# Patient Record
Sex: Male | Born: 1965 | Race: Black or African American | Hispanic: No | Marital: Married | Smoking: Former smoker
Health system: Southern US, Community
[De-identification: ages and names within clinical notes are randomized; demographics above are authoritative.]

## PROBLEM LIST (undated history)

## (undated) DIAGNOSIS — E119 Type 2 diabetes mellitus without complications: Secondary | ICD-10-CM

## (undated) DIAGNOSIS — I4891 Unspecified atrial fibrillation: Secondary | ICD-10-CM

## (undated) DIAGNOSIS — G4730 Sleep apnea, unspecified: Secondary | ICD-10-CM

## (undated) DIAGNOSIS — E785 Hyperlipidemia, unspecified: Secondary | ICD-10-CM

## (undated) DIAGNOSIS — Z8669 Personal history of other diseases of the nervous system and sense organs: Secondary | ICD-10-CM

## (undated) DIAGNOSIS — I10 Essential (primary) hypertension: Secondary | ICD-10-CM

## (undated) DIAGNOSIS — T7840XA Allergy, unspecified, initial encounter: Secondary | ICD-10-CM

## (undated) DIAGNOSIS — E669 Obesity, unspecified: Secondary | ICD-10-CM

## (undated) DIAGNOSIS — Z0389 Encounter for observation for other suspected diseases and conditions ruled out: Secondary | ICD-10-CM

## (undated) DIAGNOSIS — I509 Heart failure, unspecified: Secondary | ICD-10-CM

## (undated) DIAGNOSIS — E860 Dehydration: Secondary | ICD-10-CM

## (undated) DIAGNOSIS — I428 Other cardiomyopathies: Secondary | ICD-10-CM

## (undated) HISTORY — DX: Obesity, unspecified: E66.9

## (undated) HISTORY — DX: Personal history of other diseases of the nervous system and sense organs: Z86.69

## (undated) HISTORY — PX: HERNIA REPAIR: SHX51

## (undated) HISTORY — DX: Essential (primary) hypertension: I10

## (undated) HISTORY — DX: Hyperlipidemia, unspecified: E78.5

## (undated) HISTORY — DX: Heart failure, unspecified: I50.9

## (undated) HISTORY — DX: Sleep apnea, unspecified: G47.30

## (undated) HISTORY — DX: Other cardiomyopathies: I42.8

## (undated) HISTORY — DX: Allergy, unspecified, initial encounter: T78.40XA

## (undated) HISTORY — DX: Dehydration: E86.0

## (undated) HISTORY — DX: Type 2 diabetes mellitus without complications: E11.9

---

## 1998-09-05 ENCOUNTER — Emergency Department (HOSPITAL_COMMUNITY): Admission: EM | Admit: 1998-09-05 | Discharge: 1998-09-05 | Payer: Self-pay | Admitting: Emergency Medicine

## 1998-09-16 ENCOUNTER — Emergency Department (HOSPITAL_COMMUNITY): Admission: EM | Admit: 1998-09-16 | Discharge: 1998-09-16 | Payer: Self-pay | Admitting: Emergency Medicine

## 1999-03-16 ENCOUNTER — Emergency Department (HOSPITAL_COMMUNITY): Admission: EM | Admit: 1999-03-16 | Discharge: 1999-03-16 | Payer: Self-pay | Admitting: Emergency Medicine

## 1999-06-08 ENCOUNTER — Emergency Department (HOSPITAL_COMMUNITY): Admission: EM | Admit: 1999-06-08 | Discharge: 1999-06-08 | Payer: Self-pay | Admitting: Emergency Medicine

## 1999-07-30 ENCOUNTER — Encounter: Payer: Self-pay | Admitting: Emergency Medicine

## 1999-07-30 ENCOUNTER — Emergency Department (HOSPITAL_COMMUNITY): Admission: EM | Admit: 1999-07-30 | Discharge: 1999-07-30 | Payer: Self-pay | Admitting: Emergency Medicine

## 2000-04-26 ENCOUNTER — Encounter: Payer: Self-pay | Admitting: Emergency Medicine

## 2000-04-26 ENCOUNTER — Emergency Department (HOSPITAL_COMMUNITY): Admission: EM | Admit: 2000-04-26 | Discharge: 2000-04-26 | Payer: Self-pay | Admitting: Emergency Medicine

## 2000-05-17 ENCOUNTER — Emergency Department (HOSPITAL_COMMUNITY): Admission: EM | Admit: 2000-05-17 | Discharge: 2000-05-17 | Payer: Self-pay | Admitting: Emergency Medicine

## 2000-07-10 ENCOUNTER — Encounter: Payer: Self-pay | Admitting: Internal Medicine

## 2000-07-10 ENCOUNTER — Emergency Department (HOSPITAL_COMMUNITY): Admission: EM | Admit: 2000-07-10 | Discharge: 2000-07-10 | Payer: Self-pay | Admitting: Internal Medicine

## 2000-08-23 ENCOUNTER — Emergency Department (HOSPITAL_COMMUNITY): Admission: EM | Admit: 2000-08-23 | Discharge: 2000-08-23 | Payer: Self-pay | Admitting: Emergency Medicine

## 2000-08-31 ENCOUNTER — Emergency Department (HOSPITAL_COMMUNITY): Admission: EM | Admit: 2000-08-31 | Discharge: 2000-08-31 | Payer: Self-pay | Admitting: Emergency Medicine

## 2001-01-16 ENCOUNTER — Emergency Department (HOSPITAL_COMMUNITY): Admission: EM | Admit: 2001-01-16 | Discharge: 2001-01-16 | Payer: Self-pay | Admitting: Emergency Medicine

## 2002-01-24 DIAGNOSIS — I509 Heart failure, unspecified: Secondary | ICD-10-CM

## 2002-01-24 DIAGNOSIS — IMO0001 Reserved for inherently not codable concepts without codable children: Secondary | ICD-10-CM

## 2002-01-24 HISTORY — DX: Heart failure, unspecified: I50.9

## 2002-01-24 HISTORY — DX: Reserved for inherently not codable concepts without codable children: IMO0001

## 2002-04-09 ENCOUNTER — Inpatient Hospital Stay (HOSPITAL_COMMUNITY): Admission: EM | Admit: 2002-04-09 | Discharge: 2002-04-11 | Payer: Self-pay | Admitting: Emergency Medicine

## 2002-04-09 ENCOUNTER — Encounter: Payer: Self-pay | Admitting: Cardiology

## 2002-04-09 ENCOUNTER — Encounter (INDEPENDENT_AMBULATORY_CARE_PROVIDER_SITE_OTHER): Payer: Self-pay | Admitting: Cardiology

## 2003-01-25 HISTORY — PX: CARDIAC CATHETERIZATION: SHX172

## 2005-05-30 ENCOUNTER — Emergency Department (HOSPITAL_COMMUNITY): Admission: EM | Admit: 2005-05-30 | Discharge: 2005-05-30 | Payer: Self-pay | Admitting: Emergency Medicine

## 2006-04-05 ENCOUNTER — Emergency Department (HOSPITAL_COMMUNITY): Admission: EM | Admit: 2006-04-05 | Discharge: 2006-04-05 | Payer: Self-pay | Admitting: Emergency Medicine

## 2009-08-07 ENCOUNTER — Encounter: Admission: RE | Admit: 2009-08-07 | Discharge: 2009-08-07 | Payer: Self-pay | Admitting: Internal Medicine

## 2010-02-14 ENCOUNTER — Encounter: Payer: Self-pay | Admitting: Internal Medicine

## 2010-06-11 NOTE — Discharge Summary (Signed)
NAME:  Juan Schwartz, Juan Schwartz                     ACCOUNT NO.:  [id number]   MEDICAL RECORD NO.:  [id number]                   PATIENT TYPE:  INP   LOCATION:  3741                                 FACILITY:  [facility]   PHYSICIAN:  Thereasa Solo. Little, M.D.              DATE OF BIRTH:  [birth year]   DATE OF ADMISSION:  04/09/2002  DATE OF DISCHARGE:  04/11/2002                                 DISCHARGE SUMMARY   ADMISSION DIAGNOSES:  1. Chest pain with shortness of breath.  2. Tobacco abuse.   DISCHARGE DIAGNOSES:  1. Chest pain, resolved.  2. Normal coronary arteries by cardiac catheterization, April 10, 2002.  3. Left ventricular dysfunction, ejection fraction of 35-40% by     echocardiogram, April 10, 2002.  4. Tobacco abuse.   PROCEDURES:  Left heart catheterization, April 10, 2002.   COMPLICATIONS:  None.   DISCHARGE STATUS:  Stable.  Improved.   ADMISSION HISTORY:  This is a 45 year old male who had sudden onset of chest  discomfort with shortness of breath that awakened him from sleep on the  morning of admission.  He had described it as substernal and radiating to  the left shoulder that lasted approximately 20 minutes.  He denied any  associated nausea or diaphoresis.  Pain spontaneously resolved and he went  on to work, however, upon getting out of his car and walking into the  building at work, he again developed chest pain and shortness of breath.  He  walked back to his car, feeling he was unable to go to work, and presented  to [facility] for [facility] evaluation.  He was seen to have anginal  symptoms with a right axis on his EKG and was sent to the emergency room for  further treatment and evaluation.  Upon arrival to the emergency room, all  his symptoms had resolved.   PHYSICAL EXAMINATION:  VITAL SIGNS:  Blood pressure 138/69, heart rate 80,  respirations 20, temperature 99.8, O2 saturations 96% on room air.   LABORATORY AND ACCESSORY CLINICAL DATA:  EKG  did show normal sinus rhythm  with a right axis.  No other EKGs to compare.   Admission chest x-ray showed cardiomegaly and some central interstitial and  air space opacities that were felt to reflect CHF.   Admission labs showed a normal CBC and CMP with the exception of alkaline  phosphatase being slightly elevated at 120.  Initial cardiac enzymes were  normal.   HOSPITAL COURSE:  The patient was admitted to rule out MI.  Cardiac enzymes  will be cycled.  Repeat EKG in the morning.  There was a questionable reflux  component and he was started on PPI.  In addition, he was placed on aspirin;  IV heparin and IV nitroglycerin will be started if he had recurrent pain.  Unknown lipid status and lipids were checked with the next blood draw in the  a.m.  Repeat cardiac enzymes again were normal.  He had three normal sets.  He had  no recurrent chest pain for the rest of this admission.  He denied any  shortness of breath or orthopnea.  Lipid profile showed a total of 183,  triglycerides 150, HDL 50, LDL 103.   Two-dimensional echocardiogram was performed on April 10, 2002 which showed  LV dysfunction with an EF of 35-45%.  Left atrial dimension was 4.5.  No  other gross abnormalities.   On April 10, 2002, he was taken to the cardiac cath lab for further  evaluation.  He was found to have normal coronaries and LV systolic function  estimated at 50%.  There were no focal left ventricular abnormalities.  At  this point, because of LV dysfunction, he was started on an ACE inhibitor.  He also had a smoking cessation consultation in regards to his tobacco  abuse.   On April 11, 2002, he continued to remain pain-free and without shortness of  breath.  Vital signs were stable.  Repeat laboratory tests were normal.  The  right groin cath site was without bruising.  He was discharged home without  further incident.  Outpatient sleep study will be arranged after discharge  secondary to moderate  obesity and questionable history of sleep apnea per  his girlfriend.   DISCHARGE MEDICATIONS:  1. Lasix 20 mg daily.  2. Aspirin 320 mg daily.  3. Mavik 2 mg daily.  4. Over-the-counter Pepcid, Zantac or Prilosec daily.   ACTIVITY:  He is not to return to work until Monday, April 22, 2002.  He is  not to undertake any strenuous physical activity, i.e., sexual activity,  lifting more than 10 pounds, bending, stooping or straining for the next two  days, then activity as tolerated.   DIET:  He is to maintain a low-salt, low-fat, low-cholesterol diet.   WOUND CARE:  He may shower but we have asked him not to soak in a bathtub or  a hot tub for the next two days.  He may gently wash his right groin cath  site with soap and water.  If he notices any pain, bruising or swelling to  the cath site, he is to contact Dr. Gaspar Garbe B. Little.   FOLLOWUP:  The patient is to see Dr. Clarene Duke in three weeks for followup.  He  is to call for an appointment.     [name], N.P.                        Thereasa Solo. Little, M.D.    HB/MEDQ  D:  05/20/2002  T:  05/21/2002  Job:  [id number]   cc:   Brett Canales A. Cleta Alberts, M.D.  [address]  [place]  [place] [address]  Fax: [phone]

## 2010-06-11 NOTE — Cardiovascular Report (Signed)
NAME:  Juan Schwartz, Juan Schwartz                     ACCOUNT NO.:  [id number]   MEDICAL RECORD NO.:  [id number]                   PATIENT TYPE:  INP   LOCATION:  3741                                 FACILITY:  [facility]   PHYSICIAN:  Thereasa Solo. Little, M.D.              DATE OF BIRTH:  [birth year]   DATE OF PROCEDURE:  04/10/2002  DATE OF DISCHARGE:                              CARDIAC CATHETERIZATION   PROCEDURE:  Cardiac catheterization.   INDICATIONS:  The patient is a 45 year old male who presented to urgent care  with new onset of exertional chest pain and marked breathlessness.  His EKG  is unremarkable, and his cardiac enzymes were negative, but his chest x-ray  showed mild congestive heart failure.  An echocardiogram showed an ejection  fraction of 35-45% and because of that, he is brought to the catheterization  lab for cardiac catheterization.   DESCRIPTION OF PROCEDURE:  After obtaining informed consent, the patient was  prepared and draped in the usual sterile fashion exposing the right groin.  Following local anesthetic with 1% Xylocaine, the Seldinger technique was  employed, and after multiple attempts the artery was finally entered with  the aid of a Smart needle.  The 5 French introducer sheath was then placed  in the right femoral artery.  Left and right coronary arteriography and  ventriculography in both the RAO and LAO projection was performed.   COMPLICATIONS:  None.   EQUIPMENT:  5 French Judkins configuration catheters.   TOTAL CONTRAST:  150 mL.   HEMODYNAMIC MONITORING:  Central aortic pressure 141/92 with a left  ventricular pressure of 141/15 and no significant aortic valve gradient  noted at the time of pullback.   VENTRICULOGRAPHY:  Ventriculography in both the LAO and ROA projection  revealed the left ventricle to be dilated.  The ejection fraction, however,  was approximately 50%, and there was no evidence of mitral regurgitation.  The end-diastolic  pressure was slightly elevated at 23, and there were no  focal wall motion abnormalities.   CORONARY ARTERIOGRAPHY:  1. Left main:  Normal.  2. Left anterior descending:  The LAD extended down to the apex of the heart     and had small diagonal branches.  These were all free of disease.  3. Circumflex:  The circumflex  was a very large dominant vessel with three     obtuse marginals and the PDA, all of which were free of disease.  4. Right coronary artery:  The right coronary artery is a nondominant vessel     supplying only the right ventricle, free of disease.   CONCLUSION:  1. No evidence of coronary artery disease.  2. Dilatation of the left ventricle with approximately 50% ejection fraction     and slight elevated left ventricular end-diastolic pressure.   PLAN:  At this point, the patient will be started on an ACE inhibitor and  continued on mild diuretics.  He should  be ready for discharge either later  today or tomorrow depending on his right groin and his ability to ambulate  without significant restlessness.                                               Thereasa Solo. Little, M.D.    ABL/MEDQ  D:  04/10/2002  T:  04/11/2002  Job:  [id number]   cc:   Brett Canales A. Cleta Alberts, M.D.  [address]  [place]  [place] [address]  Fax: [phone]   Catheterization Lab

## 2011-11-29 ENCOUNTER — Ambulatory Visit (INDEPENDENT_AMBULATORY_CARE_PROVIDER_SITE_OTHER): Payer: 59 | Admitting: Family Medicine

## 2011-11-29 ENCOUNTER — Ambulatory Visit: Payer: 59

## 2011-11-29 VITALS — BP 95/65 | HR 90 | Temp 98.6°F | Resp 20 | Ht 69.0 in | Wt 340.8 lb

## 2011-11-29 DIAGNOSIS — R0789 Other chest pain: Secondary | ICD-10-CM

## 2011-11-29 DIAGNOSIS — E669 Obesity, unspecified: Secondary | ICD-10-CM

## 2011-11-29 DIAGNOSIS — E119 Type 2 diabetes mellitus without complications: Secondary | ICD-10-CM

## 2011-11-29 DIAGNOSIS — G4733 Obstructive sleep apnea (adult) (pediatric): Secondary | ICD-10-CM

## 2011-11-29 DIAGNOSIS — E1165 Type 2 diabetes mellitus with hyperglycemia: Secondary | ICD-10-CM | POA: Insufficient documentation

## 2011-11-29 DIAGNOSIS — I10 Essential (primary) hypertension: Secondary | ICD-10-CM

## 2011-11-29 DIAGNOSIS — E291 Testicular hypofunction: Secondary | ICD-10-CM

## 2011-11-29 HISTORY — DX: Type 2 diabetes mellitus without complications: E11.9

## 2011-11-29 HISTORY — DX: Obesity, unspecified: E66.9

## 2011-11-29 LAB — COMPREHENSIVE METABOLIC PANEL
ALT: 24 U/L (ref 0–53)
AST: 20 U/L (ref 0–37)
Albumin: 4.4 g/dL (ref 3.5–5.2)
Calcium: 9.8 mg/dL (ref 8.4–10.5)
Chloride: 100 mEq/L (ref 96–112)
Potassium: 4 mEq/L (ref 3.5–5.3)

## 2011-11-29 LAB — POCT CBC
Granulocyte percent: 62.9 %G (ref 37–80)
HCT, POC: 50.2 % (ref 43.5–53.7)
Hemoglobin: 15.6 g/dL (ref 14.1–18.1)
Lymph, poc: 2.9 (ref 0.6–3.4)
POC Granulocyte: 6.2 (ref 2–6.9)

## 2011-11-29 LAB — POCT GLYCOSYLATED HEMOGLOBIN (HGB A1C): Hemoglobin A1C: 8.6

## 2011-11-29 LAB — LIPID PANEL
HDL: 50 mg/dL (ref >39)
LDL Cholesterol: 110 mg/dL — ABNORMAL HIGH (ref 0–99)

## 2011-11-29 LAB — GLUCOSE, POCT (MANUAL RESULT ENTRY): POC Glucose: 221 mg/dl — AB (ref 70–99)

## 2011-11-29 MED ORDER — ASPIRIN 81 MG PO CHEW: 243.0000 mg | CHEWABLE_TABLET | Freq: Once | ORAL | Status: DC

## 2011-11-29 NOTE — Patient Instructions (Addendum)
Start cutting your blood pressure pill in half.  We are going to call and set up an appointment with your heart doctor for next week.  In the meantime start taking 4 of your baby aspirins every day for a total of 325 mg.    It looks like you do have sleep apnea.  I will take care of getting you back in with the sleep medicine doctor.   IF you have return of your chest discomfort please get help right away!  Call 911 or go to the nearest ER.

## 2011-11-29 NOTE — Progress Notes (Signed)
[facility] [address] [place] [address] [phone]  Date:  11/29/2011   Name:  MYLZ YUAN   DOB:  [birth year]   MRN:  [id number]  PCP:  No primary provider on file.    Chief Complaint: Hot and sweating and Irregular Heart Beat   History of Present Illness:  Lealon Vanputten Lupton is a 46 y.o. very pleasant male patient who presents with the following:  Last night after eating dinner he noted some "tightness" in his chest. He felt hot and sweaty. He drank water and went to bed feeling "a little queasy" but he was able to sleep.  He awoke around 0300 this am- he was sweaty and felt "funny."  He went to work but was sweating and feeling bad.  He went to the nurse at his job- his BP and temperature was ok.  This was at around 7am.  He decided to come in for evaluation but his symptoms resolved about 2 hours prior to his arrival here.    He does have a history of one episode of CHF- this occurred in 2004.  He had a clean cath at that time but his echo showed an EF of 35- 40%.  He was started on an Ace and got better.  He has not had any other heart problem since.  He was seeing Dr. Clarene Duke at [facility] but has not been seen in some years.   He has not undergone a stress test or another cath since the incident in 2004.    He has not had any chest discomfort since last night.  He had not noted any palpitations.  No SOB. No current CP  He does have DM, maintained on oral agents. He was last here about a year ago- we have not done any labs since last November at which time his A1c was 9.2%   He did take one 81 mg asa this am.  He takes ASA 81 every day.  He has never been a smoker.  He states that about 3 months ago he received a refill of his BP medication, but his dosage was mistakenly increased from lisinopril/ HCTZ 10/12.5 to 20/ 25.  This occurred at a different urgent care clinic.  He called the clinic and was told that this was a mistake, but he should just cut  the pill in half.  However, he has been taking a whole pill instead as he did not want to bother with splitting the pill.  He states that his BP was well controlled with his old dosage- his BP here at his last visit on 12/28/11 was 111/72 while on lisinopril/ hctz 10/12.5  He is fasting today except for some [facility].    There is no problem list on file for this patient.   Past Medical History  Diagnosis Date  . Allergy   . CHF (congestive heart failure)   . Diabetes mellitus without complication     Past Surgical History  Procedure Date  . Hernia repair   . Cardiac catheterization 2005    History  Substance Use Topics  . Smoking status: Never Smoker   . Smokeless tobacco: Not on file  . Alcohol Use: No    History reviewed. No pertinent family history.  Not on File  Medication list has been reviewed and updated.  Current Outpatient Prescriptions on File Prior to Visit  Medication Sig Dispense Refill  . glipiZIDE (GLUCOTROL XL) 10 MG 24 hr  tablet Take 10 mg by mouth daily.      Marland Kitchen lisinopril-hydrochlorothiazide (PRINZIDE,ZESTORETIC) 20-25 MG per tablet Take 1 tablet by mouth daily.      . metFORMIN (GLUCOPHAGE) 1000 MG tablet Take 1,000 mg by mouth 2 (two) times daily with a meal.        Review of Systems:  As per HPI- otherwise negative. He does not use CPAP although it looks like he was diagnosed with OSA in the past.  He states that he did not hear back from the sleep medicine clinic so he assumed he did not need to follow- up.  He does snore at night.  He does not note ankle swelling, orthopnea or PND at this time.   Physical Examination: Filed Vitals:   11/29/11 0903  BP: 142/72  Pulse: 123  Temp: 98.6 F (37 C)  Resp: 20   Filed Vitals:   11/29/11 0903  Height: 5\' 9"  (1.753 m)  Weight: 340 lb 12.8 oz (154.586 kg)    See repeat BP and pulse done once he was comfortable and had been here for about an hour Body mass index is 50.33 kg/(m^2). Ideal Body  Weight: Weight in (lb) to have BMI = 25: 168.9   GEN: WDWN, NAD, Non-toxic, A & O x 3, obese.  No sweating or signs of distress at this time HEENT: Atraumatic, Normocephalic. Neck supple. No masses, No LAD. Bilateral TM wnl, oropharynx normal.  PEERL,EOMI.   Ears and Nose: No external deformity. CV: RRR, No M/G/R. No JVD. No thrill. No extra heart sounds. PULM: CTA B, no wheezes, crackles, rhonchi. No retractions. No resp. distress. No accessory muscle use. ABD: S, NT, ND, +BS. No rebound. No HSM. EXTR: No c/c/e NEURO Normal gait.  PSYCH: Normally interactive. Conversant. Not depressed or anxious appearing.  Calm demeanor.   EKG:  SR.  Compared to old EKGs the only change I see is a likely insignificant Q wave in III. No ST elevation or depression He does have poor R wave progression but this is not new.  Dr. Alanda Amass of [facility] also looked at the EKG and agreed that there is no acute abnormality.    [facility] reading (PRIMARY) by  Dr. Patsy Lager.  CXR:  Cardiomegaly and hazy right heart border but no effusion  CHEST - 2 VIEW  Comparison: None.  Findings: The lungs are clear. The heart size and pulmonary vascularity are within normal limits. No adenopathy. No bone lesions.  IMPRESSION: Lungs clear   Results for orders placed in visit on 11/29/11  POCT CBC      Component Value Range   WBC 9.9  4.6 - 10.2 K/uL   Lymph, poc 2.9  0.6 - 3.4   POC LYMPH PERCENT 29.7  10 - 50 %L   MID (cbc) 0.7  0 - 0.9   POC MID % 7.4  0 - 12 %M   POC Granulocyte 6.2  2 - 6.9   Granulocyte percent 62.9  37 - 80 %G   RBC 5.33  4.69 - 6.13 M/uL   Hemoglobin 15.6  14.1 - 18.1 g/dL   HCT, POC 96.0  45.4 - 53.7 %   MCV 94.2  80 - 97 fL   MCH, POC 29.3  27 - 31.2 pg   MCHC 31.1 (*) 31.8 - 35.4 g/dL   RDW, POC 09.8     Platelet Count, POC 218  142 - 424 K/uL   MPV 11.4  0 - 99.8 fL  POCT  GLYCOSYLATED HEMOGLOBIN (HGB A1C)      Component Value Range   Hemoglobin A1C 8.6    GLUCOSE, POCT (MANUAL RESULT ENTRY)       Component Value Range   POC Glucose 221 (*) 70 - 99 mg/dl   Given #3 additional asa 81 upon my evaluation   Assessment and Plan: 1. Chest tightness  EKG 12-Lead, POCT CBC, aspirin chewable tablet 243 mg, DG Chest 2 View, Lipid panel, Ambulatory referral to Cardiology  2. Diabetes mellitus, type II  POCT glycosylated hemoglobin (Hb A1C), POCT glucose (manual entry)  3. HTN (hypertension)  Comprehensive metabolic panel, Lipid panel  4. Obesity  Lipid panel  5. Hypogonadism male  Testosterone  6. Diabetes mellitus, type 2    7. OSA (obstructive sleep apnea)  Ambulatory referral to Sleep Studies    Sheri is a 46 year old male with a history of one episode of CHF and normal cath about 10 years ago, but no other known history of heart problems.   He may have had an episode of angina last night.  His symptoms are now resolved.  Offered to have him evaluated further at the ED but he really does not want to do this.  Cautioned him about the importance of seeking care right away if his symptoms come back.  He may also have had a hypotensive episode as his BP medication is likely too strong for him.  Will have him start cutting his pills in half- he is willing to do this. Scheduled an appt for him at [facility] on Friday- he would prefer Monday but this date was not available.  See patient instructions for more details.    DM: control is ok but not perfect.  He would like to start exercising more but I recommended that we make sure his heart is ok first.    He is fasting today except for soda so will check an FLP.    Auden reports that he has been told he had low testosterone in the past and would like to check this today as well- will do so.    I will refer him back to the sleep medicine clinic to address his OSA  COPLAND,JESSICA, MD

## 2011-11-30 ENCOUNTER — Telehealth: Payer: Self-pay | Admitting: Family Medicine

## 2011-11-30 DIAGNOSIS — E785 Hyperlipidemia, unspecified: Secondary | ICD-10-CM

## 2011-11-30 MED ORDER — ATORVASTATIN CALCIUM 20 MG PO TABS: 20.0000 mg | ORAL_TABLET | Freq: Every day | ORAL | Status: DC

## 2011-11-30 NOTE — Telephone Encounter (Signed)
Called and spoke with him.  His testosterone is just slightly low- we can start therapy once his heart situation is taken care of if he likes.  Also, we need to start some medication for his cholesterol.  He is ok with this plan.  Will start 20 mg of atorvastatin.    His symptoms have not come back- he is feeling fine and is aware of his cardiology appy this Friday.

## 2011-12-01 ENCOUNTER — Encounter: Payer: Self-pay | Admitting: Family Medicine

## 2011-12-16 ENCOUNTER — Encounter: Payer: Self-pay | Admitting: *Deleted

## 2011-12-16 DIAGNOSIS — G4730 Sleep apnea, unspecified: Secondary | ICD-10-CM

## 2011-12-16 HISTORY — DX: Sleep apnea, unspecified: G47.30

## 2012-01-02 ENCOUNTER — Ambulatory Visit (INDEPENDENT_AMBULATORY_CARE_PROVIDER_SITE_OTHER): Payer: 59 | Admitting: Physician Assistant

## 2012-01-02 VITALS — BP 124/80 | HR 88 | Temp 98.0°F | Resp 18 | Ht 69.0 in | Wt 343.0 lb

## 2012-01-02 DIAGNOSIS — J329 Chronic sinusitis, unspecified: Secondary | ICD-10-CM

## 2012-01-02 DIAGNOSIS — J069 Acute upper respiratory infection, unspecified: Secondary | ICD-10-CM

## 2012-01-02 MED ORDER — IPRATROPIUM BROMIDE 0.03 % NA SOLN: 2.0000 | Freq: Two times a day (BID) | NASAL | Status: DC

## 2012-01-02 MED ORDER — AMOXICILLIN 875 MG PO TABS: 875.0000 mg | ORAL_TABLET | Freq: Two times a day (BID) | ORAL | Status: DC

## 2012-01-02 NOTE — Progress Notes (Signed)
  Subjective:    Patient ID: Juan Schwartz, male    DOB: 1965-04-08, 46 y.o.   MRN: [id number]  HPI 46 year old male presents with 4 day history of nasal congestion, postnasal drainage, and sinus pressure. Denies cough, sore throat, otalgia, fevers, chills, nausea, vomiting, or headache. He has been using an OTC antihistamine which hasn't been helping.  States he "gets a sinus infection 3x/year."  He has DM, HTN, and hyperlipidemia.  Admits wife has been sick with similar illness.      Review of Systems  Constitutional: Negative for fever and chills.  HENT: Positive for congestion, rhinorrhea, postnasal drip and sinus pressure. Negative for ear pain, sore throat, neck pain and neck stiffness.   Respiratory: Negative for cough and shortness of breath.   Cardiovascular: Negative for chest pain.  Gastrointestinal: Negative for nausea, vomiting and abdominal pain.  Neurological: Negative for headaches.  All other systems reviewed and are negative.       Objective:   Physical Exam  Constitutional: He is oriented to person, place, and time. He appears well-developed and well-nourished.  HENT:  Head: Normocephalic and atraumatic.  Right Ear: Hearing, tympanic membrane, external ear and ear canal normal.  Left Ear: Hearing, tympanic membrane, external ear and ear canal normal.  Mouth/Throat: Uvula is midline, oropharynx is clear and moist and mucous membranes are normal. No oropharyngeal exudate.  Eyes: Conjunctivae normal are normal.  Neck: Normal range of motion.  Cardiovascular: Normal rate, regular rhythm and normal heart sounds.   Pulmonary/Chest: Effort normal and breath sounds normal.  Lymphadenopathy:    He has no cervical adenopathy.  Neurological: He is alert and oriented to person, place, and time.  Psychiatric: He has a normal mood and affect. His behavior is normal. Judgment and thought content normal.          Assessment & Plan:   1. Sinusitis  amoxicillin  (AMOXIL) 875 MG tablet, ipratropium (ATROVENT) 0.03 % nasal spray   Amoxicillin 875 mg bid Atrovent NS bid Mucinex as directed Ok to call in [place] if he develops cough Follow up if symptoms worsen or fail to improve.

## 2012-03-03 ENCOUNTER — Ambulatory Visit (INDEPENDENT_AMBULATORY_CARE_PROVIDER_SITE_OTHER): Payer: 59 | Admitting: Emergency Medicine

## 2012-03-03 ENCOUNTER — Ambulatory Visit (HOSPITAL_COMMUNITY)
Admission: RE | Admit: 2012-03-03 | Discharge: 2012-03-03 | Disposition: A | Discharge status: Home / Self Care | Payer: 59 | Source: Ambulatory Visit | Attending: Emergency Medicine | Admitting: Emergency Medicine

## 2012-03-03 VITALS — BP 114/77 | HR 83 | Temp 98.3°F | Resp 18 | Ht 69.0 in | Wt 342.0 lb

## 2012-03-03 DIAGNOSIS — M79662 Pain in left lower leg: Secondary | ICD-10-CM

## 2012-03-03 DIAGNOSIS — M2420 Disorder of ligament, unspecified site: Secondary | ICD-10-CM | POA: Insufficient documentation

## 2012-03-03 DIAGNOSIS — M7989 Other specified soft tissue disorders: Secondary | ICD-10-CM | POA: Insufficient documentation

## 2012-03-03 DIAGNOSIS — N529 Male erectile dysfunction, unspecified: Secondary | ICD-10-CM

## 2012-03-03 DIAGNOSIS — M79609 Pain in unspecified limb: Secondary | ICD-10-CM | POA: Insufficient documentation

## 2012-03-03 DIAGNOSIS — M629 Disorder of muscle, unspecified: Secondary | ICD-10-CM | POA: Insufficient documentation

## 2012-03-03 MED ORDER — HYDROCODONE-ACETAMINOPHEN 7.5-325 MG PO TABS: 1.0000 | ORAL_TABLET | Freq: Four times a day (QID) | ORAL | Status: DC | PRN

## 2012-03-03 MED ORDER — SILDENAFIL CITRATE 100 MG PO TABS: 50.0000 mg | ORAL_TABLET | Freq: Every day | ORAL | Status: DC | PRN

## 2012-03-03 NOTE — Progress Notes (Signed)
  Subjective:    Patient ID: Juan Schwartz, male    DOB: Feb 10, 1965, 47 y.o.   MRN: [id number]  HPI patient has been having pain in his left calf called last week. He has been very sore the last couple days and then yesterday when he was out walking he felt a pop in the back of his left leg and since then he has had extreme difficulty with walking. He is also noticed swelling in the left leg. He has no history of clots in the leg. Patient also requesting meds for her ED. He is not on nitroglycerin .    Review of Systems     Objective:   Physical Exam there is marked tenderness of the entire left calf. There is tenderness posteriorly and laterally and medially. He has severe pain when he dorsiflexes the foot. Dorsalis pedis and posterior tibial pulses are 2+        Assessment & Plan:  The history is more suggestive that the patient ruptured the calf muscle. He is currently having a great deal of difficulty walking. I'm going to give him medications for pain . He was placed in a cam boot with crutches to go to the hospital for an emergent ultrasound .

## 2012-03-03 NOTE — Patient Instructions (Addendum)
Please go to  [facility] hospital for an ultrasound of the left calf. Please be there by 3:45.

## 2012-03-03 NOTE — Addendum Note (Signed)
Addended by: Cydney Ok on: 03/03/2012 04:17 PM   Modules accepted: Orders

## 2012-03-07 ENCOUNTER — Ambulatory Visit (INDEPENDENT_AMBULATORY_CARE_PROVIDER_SITE_OTHER): Payer: 59 | Admitting: Emergency Medicine

## 2012-03-07 VITALS — BP 123/80 | HR 83 | Temp 98.0°F | Resp 18 | Ht 69.0 in | Wt 342.0 lb

## 2012-03-07 DIAGNOSIS — T148XXA Other injury of unspecified body region, initial encounter: Secondary | ICD-10-CM

## 2012-03-07 DIAGNOSIS — M79662 Pain in left lower leg: Secondary | ICD-10-CM

## 2012-03-07 DIAGNOSIS — N529 Male erectile dysfunction, unspecified: Secondary | ICD-10-CM

## 2012-03-07 DIAGNOSIS — M79609 Pain in unspecified limb: Secondary | ICD-10-CM

## 2012-03-07 NOTE — Progress Notes (Signed)
  Subjective:    Patient ID: Juan Schwartz, male    DOB: [birth year], 47 y.o.   MRN: [id number]  HPI patient enters for followup of his left calf injury. He had an ultrasound which did not disclose a clot. He is doing better with decreased pain. His exam and ultrasound were most consistent with a calf tear.    Review of Systems     Objective:   Physical Exam he has persistent pain over the mid calf. There is significant swelling noted        Assessment & Plan:   Here with a left calf tear. He's been referred to the orthopedist for evaluation. He will need physical therapy to recover from this injury . I suspect he will be out of work for 6 weeks to

## 2012-04-27 ENCOUNTER — Encounter: Payer: Self-pay | Admitting: Family Medicine

## 2012-05-11 ENCOUNTER — Ambulatory Visit (INDEPENDENT_AMBULATORY_CARE_PROVIDER_SITE_OTHER): Payer: 59 | Admitting: Physician Assistant

## 2012-05-11 VITALS — BP 118/72 | HR 80 | Temp 98.1°F | Resp 18 | Ht 69.0 in | Wt 337.4 lb

## 2012-05-11 DIAGNOSIS — Z945 Skin transplant status: Secondary | ICD-10-CM

## 2012-05-11 DIAGNOSIS — Z87828 Personal history of other (healed) physical injury and trauma: Secondary | ICD-10-CM

## 2012-05-11 DIAGNOSIS — Z5189 Encounter for other specified aftercare: Secondary | ICD-10-CM

## 2012-05-11 DIAGNOSIS — S21101D Unspecified open wound of right front wall of thorax without penetration into thoracic cavity, subsequent encounter: Secondary | ICD-10-CM

## 2012-05-11 DIAGNOSIS — Z9889 Other specified postprocedural states: Secondary | ICD-10-CM

## 2012-05-11 MED ORDER — DOXYCYCLINE HYCLATE 100 MG PO CAPS: 100.0000 mg | ORAL_CAPSULE | Freq: Two times a day (BID) | ORAL | Status: DC
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
## 2012-05-11 NOTE — Patient Instructions (Addendum)
Begin taking the antibiotics as directed.  Be sure to finish the full course.  I have put in a referral to surgery - you will get a phone call about scheduling this from our office or the surgeon's office.  If anything is worsening or not improving please let us know

## 2012-05-14 ENCOUNTER — Encounter: Payer: Self-pay | Admitting: Family Medicine

## 2012-08-25 ENCOUNTER — Ambulatory Visit (INDEPENDENT_AMBULATORY_CARE_PROVIDER_SITE_OTHER): Payer: 59 | Admitting: Emergency Medicine

## 2012-08-25 VITALS — BP 126/83 | HR 88 | Temp 98.0°F | Resp 18 | Wt 328.0 lb

## 2012-08-25 DIAGNOSIS — E785 Hyperlipidemia, unspecified: Secondary | ICD-10-CM

## 2012-08-25 DIAGNOSIS — I10 Essential (primary) hypertension: Secondary | ICD-10-CM

## 2012-08-25 DIAGNOSIS — E119 Type 2 diabetes mellitus without complications: Secondary | ICD-10-CM

## 2012-08-25 DIAGNOSIS — G4733 Obstructive sleep apnea (adult) (pediatric): Secondary | ICD-10-CM

## 2012-08-25 LAB — POCT URINALYSIS DIPSTICK
Bilirubin, UA: NEGATIVE
Blood, UA: NEGATIVE
Ketones, UA: NEGATIVE
Nitrite, UA: NEGATIVE
Spec Grav, UA: 1.015
pH, UA: 5

## 2012-08-25 LAB — POCT CBC
HCT, POC: 44.3 % (ref 43.5–53.7)
MCH, POC: 29.6 pg (ref 27–31.2)
MCV: 93.6 fL (ref 80–97)
MID (cbc): 0.7 (ref 0–0.9)
POC LYMPH PERCENT: 25.7 %L (ref 10–50)
Platelet Count, POC: 131 10*3/uL — AB (ref 142–424)
RBC: 4.73 M/uL (ref 4.69–6.13)
WBC: 7.4 10*3/uL (ref 4.6–10.2)

## 2012-08-25 LAB — LIPID PANEL
Cholesterol: 182 mg/dL (ref 0–200)
HDL: 48 mg/dL (ref >39)
Total CHOL/HDL Ratio: 3.8 Ratio

## 2012-08-25 LAB — COMPREHENSIVE METABOLIC PANEL
Albumin: 4.2 g/dL (ref 3.5–5.2)
BUN: 13 mg/dL (ref 6–23)
CO2: 23 mEq/L (ref 19–32)
Calcium: 9.4 mg/dL (ref 8.4–10.5)
Chloride: 101 mEq/L (ref 96–112)
Creat: 0.64 mg/dL (ref 0.50–1.35)
Glucose, Bld: 287 mg/dL — ABNORMAL HIGH (ref 70–99)
Potassium: 3.9 mEq/L (ref 3.5–5.3)

## 2012-08-25 LAB — POCT UA - MICROSCOPIC ONLY
Bacteria, U Microscopic: NEGATIVE
Mucus, UA: NEGATIVE
Yeast, UA: NEGATIVE

## 2012-08-25 MED ORDER — ATORVASTATIN CALCIUM 20 MG PO TABS: 20.0000 mg | ORAL_TABLET | Freq: Every day | ORAL | Status: DC

## 2012-08-25 MED ORDER — LISINOPRIL-HYDROCHLOROTHIAZIDE 20-25 MG PO TABS: 1.0000 | ORAL_TABLET | Freq: Every day | ORAL | Status: DC

## 2012-08-25 MED ORDER — METFORMIN HCL 1000 MG PO TABS: 1000.0000 mg | ORAL_TABLET | Freq: Two times a day (BID) | ORAL | Status: DC

## 2012-08-25 NOTE — Progress Notes (Signed)
  Subjective:    Patient ID: Juan Schwartz, male    DOB: 13-Dec-1965, 48 y.o.   MRN: [id number]  HPI Pt here for refill of his meds for DM, hyperlipidemia, HTN. He has not had labs since 11/13. He says his sugars have been doing ok. He is walking for exercise. His weight is down from the 420's to 328 today. He states he was tested for sleep apnea, and states he has it, but doesn't have a machine.    Review of Systems     Objective:   Physical Exam patient is overweight but in no distress. His neck is supple. Chest is clear to auscultation and percussion. Heart regular rate no murmurs examination pulses to be 2+ there's normal filament sensation of both feet  Results for orders placed in visit on 08/25/12  POCT CBC      Result Value Range   WBC 7.4  4.6 - 10.2 K/uL   Lymph, poc 1.9  0.6 - 3.4   POC LYMPH PERCENT 25.7  10 - 50 %L   MID (cbc) 0.7  0 - 0.9   POC MID % 8.9  0 - 12 %M   POC Granulocyte 4.8  2 - 6.9   Granulocyte percent 65.4  37 - 80 %G   RBC 4.73  4.69 - 6.13 M/uL   Hemoglobin 14.0 (*) 14.1 - 18.1 g/dL   HCT, POC 40.9  81.1 - 53.7 %   MCV 93.6  80 - 97 fL   MCH, POC 29.6  27 - 31.2 pg   MCHC 31.6 (*) 31.8 - 35.4 g/dL   RDW, POC 91.4     Platelet Count, POC 131 (*) 142 - 424 K/uL   MPV 11.1  0 - 99.8 fL  GLUCOSE, POCT (MANUAL RESULT ENTRY)      Result Value Range   POC Glucose 277 (*) 70 - 99 mg/dl  POCT GLYCOSYLATED HEMOGLOBIN (HGB A1C)      Result Value Range   Hemoglobin A1C 13.1    POCT UA - MICROSCOPIC ONLY      Result Value Range   WBC, Ur, HPF, POC 0-1     RBC, urine, microscopic 0-1     Bacteria, U Microscopic neg     Mucus, UA neg     Epithelial cells, urine per micros 0-1     Crystals, Ur, HPF, POC neg     Casts, Ur, LPF, POC neg     Yeast, UA neg    POCT URINALYSIS DIPSTICK      Result Value Range   Color, UA yellow     Clarity, UA clear     Glucose, UA 1000     Bilirubin, UA neg     Ketones, UA neg     Spec Grav, UA 1.015     Blood,  UA neg     pH, UA 5.0     Protein, UA neg     Urobilinogen, UA 0.2     Nitrite, UA neg     Leukocytes, UA Negative          Assessment & Plan:  Patient needs to take his medications regular he needs to start checking his sugars on a daily basis he needs to work on diet and weight loss. His hemoglobin A1c is 13.1. All medications are refilled. He is to recheck in 3 months. A repeat referral is made to [facility] because his last documented sleep study was 2010.

## 2012-08-27 ENCOUNTER — Other Ambulatory Visit: Payer: Self-pay

## 2012-08-27 MED ORDER — GLIPIZIDE ER 10 MG PO TB24: 10.0000 mg | ORAL_TABLET | Freq: Two times a day (BID) | ORAL | Status: DC

## 2012-09-11 ENCOUNTER — Ambulatory Visit: Payer: 59

## 2012-09-11 ENCOUNTER — Ambulatory Visit (INDEPENDENT_AMBULATORY_CARE_PROVIDER_SITE_OTHER): Payer: 59 | Admitting: Family Medicine

## 2012-09-11 VITALS — BP 122/76 | HR 86 | Temp 98.3°F | Resp 18 | Ht 69.0 in | Wt 328.0 lb

## 2012-09-11 DIAGNOSIS — R0981 Nasal congestion: Secondary | ICD-10-CM

## 2012-09-11 DIAGNOSIS — J309 Allergic rhinitis, unspecified: Secondary | ICD-10-CM

## 2012-09-11 DIAGNOSIS — J3489 Other specified disorders of nose and nasal sinuses: Secondary | ICD-10-CM

## 2012-09-11 MED ORDER — IPRATROPIUM BROMIDE 0.03 % NA SOLN: 2.0000 | Freq: Four times a day (QID) | NASAL | Status: DC

## 2012-09-11 MED ORDER — MONTELUKAST SODIUM 10 MG PO TABS: 10.0000 mg | ORAL_TABLET | Freq: Every day | ORAL | Status: DC

## 2012-09-11 NOTE — Progress Notes (Signed)
[facility] [address] [place] [address] [phone]  Date:  09/11/2012   Name:  Juan Schwartz   DOB:  15-Nov-1965   MRN:  [id number]  PCP:  No PCP Per Patient    Chief Complaint: Sinusitis   History of Present Illness:  Juan Schwartz is a 47 y.o. very pleasant male patient who presents with the following:  He is here today with "sinusitis" that seems to be recurrent.  About 2.5 weeks ago he went to the [facility] on [facility].  He was treated with augmentin 875 for 10 days.  He did get better, but then his sx seemed to return 2 days ago.    He uses some sort of nasal spray, he is not taking any sort of allergy medication.  He is using saline spray OTC now as well.  He notes nasal congestion, a "slight headache," a "lot of sneezing," some cough, runny nose.   He has not noted a fever.  No GI sx, no chills or aches.    Patient Active Problem List   Diagnosis Date Noted  . Sleep apnea 12/16/2011  . Obesity 11/29/2011  . Diabetes mellitus, type 2 11/29/2011    Past Medical History  Diagnosis Date  . Allergy   . CHF (congestive heart failure)   . Diabetes mellitus without complication   . Hyperlipidemia   . Hypertension     Past Surgical History  Procedure Laterality Date  . Hernia repair    . Cardiac catheterization  2005    History  Substance Use Topics  . Smoking status: Never Smoker   . Smokeless tobacco: Not on file  . Alcohol Use: Yes    No family history on file.  No Known Allergies  Medication list has been reviewed and updated.  Current Outpatient Prescriptions on File Prior to Visit  Medication Sig Dispense Refill  . aspirin 81 MG tablet Take 81 mg by mouth daily. Patient takes 4 daily      . atorvastatin (LIPITOR) 20 MG tablet Take 1 tablet (20 mg total) by mouth daily.  90 tablet  3  . glipiZIDE (GLUCOTROL XL) 10 MG 24 hr tablet Take 1 tablet (10 mg total) by mouth 2 (two) times daily.  60 tablet  3  .  lisinopril-hydrochlorothiazide (PRINZIDE,ZESTORETIC) 20-25 MG per tablet Take 1 tablet by mouth daily.  90 tablet  3  . metFORMIN (GLUCOPHAGE) 1000 MG tablet Take 1 tablet (1,000 mg total) by mouth 2 (two) times daily with a meal.  180 tablet  3  . sildenafil (VIAGRA) 100 MG tablet Take 0.5-1 tablets (50-100 mg total) by mouth daily as needed for erectile dysfunction.  5 tablet  11   No current facility-administered medications on file prior to visit.    Review of Systems:  As per HPI- otherwise negative.   Physical Examination: Filed Vitals:   09/11/12 0744  BP: 122/76  Pulse: 86  Temp: 98.3 F (36.8 C)  Resp: 18   Filed Vitals:   09/11/12 0744  Height: 5\' 9"  (1.753 m)  Weight: 328 lb (148.78 kg)   Body mass index is 48.42 kg/(m^2). Ideal Body Weight: Weight in (lb) to have BMI = 25: 168.9  GEN: WDWN, NAD, Non-toxic, A & O x 3, obese HEENT: Atraumatic, Normocephalic. Neck supple. No masses, No LAD.  Bilateral TM wnl, oropharynx normal.  PEERL,EOMI.   Nasal cavity inflammation Ears and Nose: No external deformity. CV: RRR, No M/G/R.  No JVD. No thrill. No extra heart sounds. PULM: CTA B, no wheezes, crackles, rhonchi. No retractions. No resp. distress. No accessory muscle use. EXTR: No c/c/e NEURO Normal gait.  PSYCH: Normally interactive. Conversant. Not depressed or anxious appearing.  Calm demeanor.   [facility] reading (PRIMARY) by  Dr. Patsy Lager. Sinuses: negative RADIOLOGY REPORT*  Clinical Data: Sinusitis  PARANASAL SINUSES - COMPLETE 3 + VIEW  Comparison: None.  Findings: Frontal, Waters, and lateral views were obtained. Paranasal sinuses are clear. There is no air-fluid level. No bony destruction or expansion. There is slight rightward deviation of the nasal septum.         Impression        Paranasal sinuses clear. Mild nasal septal deviation.        Assessment and Plan: Sinus congestion - Plan: DG Sinuses Complete, ipratropium (ATROVENT) 0.03 %  nasal spray  Allergic rhinitis - Plan: montelukast (SINGULAIR) 10 MG tablet, ipratropium (ATROVENT) 0.03 % nasal spray  Estle is here today with recurrent "sinus infections."  However, suspect that his problem is actually allergies.  See detailed patient instructions.  Avoid prednisone as he is diabetic and uncontrolled.  Asked him to please let me know if not better in the next few days No history of glaucoma or prostate hypertrophy    Signed Abbe Amsterdam, MD

## 2012-09-11 NOTE — Patient Instructions (Addendum)
It appears that you are suffering from allergies as opposed to a bacterial infection of your sinuses.   Please take zyrtec, claritin or allegra (OTC) once a day. You can also take benadryl at night.    Try adding singulair to you allergy regimen, and use your current nasal spray daily.  We are also going to add the atrovent nasal spray- this is for sneezing, runny nose and post- nasal drip.   Let me know if you are not feeling better in the next few days- Sooner if worse.

## 2012-12-22 ENCOUNTER — Emergency Department (HOSPITAL_COMMUNITY)
Admission: EM | Admit: 2012-12-22 | Discharge: 2012-12-22 | Disposition: A | Discharge status: Home / Self Care | Payer: 59 | Attending: Emergency Medicine | Admitting: Emergency Medicine

## 2012-12-22 ENCOUNTER — Encounter (HOSPITAL_COMMUNITY): Payer: Self-pay | Admitting: Emergency Medicine

## 2012-12-22 ENCOUNTER — Emergency Department (HOSPITAL_COMMUNITY): Payer: 59

## 2012-12-22 DIAGNOSIS — Y9389 Activity, other specified: Secondary | ICD-10-CM | POA: Insufficient documentation

## 2012-12-22 DIAGNOSIS — S46911A Strain of unspecified muscle, fascia and tendon at shoulder and upper arm level, right arm, initial encounter: Secondary | ICD-10-CM

## 2012-12-22 DIAGNOSIS — I10 Essential (primary) hypertension: Secondary | ICD-10-CM | POA: Insufficient documentation

## 2012-12-22 DIAGNOSIS — Z79899 Other long term (current) drug therapy: Secondary | ICD-10-CM | POA: Insufficient documentation

## 2012-12-22 DIAGNOSIS — IMO0002 Reserved for concepts with insufficient information to code with codable children: Secondary | ICD-10-CM | POA: Insufficient documentation

## 2012-12-22 DIAGNOSIS — Z9861 Coronary angioplasty status: Secondary | ICD-10-CM | POA: Insufficient documentation

## 2012-12-22 DIAGNOSIS — Y92410 Unspecified street and highway as the place of occurrence of the external cause: Secondary | ICD-10-CM | POA: Insufficient documentation

## 2012-12-22 DIAGNOSIS — E119 Type 2 diabetes mellitus without complications: Secondary | ICD-10-CM | POA: Insufficient documentation

## 2012-12-22 DIAGNOSIS — E785 Hyperlipidemia, unspecified: Secondary | ICD-10-CM | POA: Insufficient documentation

## 2012-12-22 DIAGNOSIS — Z7982 Long term (current) use of aspirin: Secondary | ICD-10-CM | POA: Insufficient documentation

## 2012-12-22 DIAGNOSIS — I509 Heart failure, unspecified: Secondary | ICD-10-CM | POA: Insufficient documentation

## 2012-12-22 MED ORDER — HYDROCODONE-ACETAMINOPHEN 5-325 MG PO TABS: 1.0000 | ORAL_TABLET | ORAL | Status: DC | PRN

## 2012-12-22 MED ORDER — HYDROCODONE-ACETAMINOPHEN 5-325 MG PO TABS
2.0000 | ORAL_TABLET | Freq: Once | ORAL | Status: AC
  Administered 2012-12-22: 2 via ORAL
  Filled 2012-12-22: qty 2
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
## 2012-12-24 ENCOUNTER — Telehealth (HOSPITAL_BASED_OUTPATIENT_CLINIC_OR_DEPARTMENT_OTHER): Payer: Self-pay

## 2012-12-24 NOTE — Telephone Encounter (Signed)
Pt called wanting work not for a week or more. Work wants him to get a note or have second opinion. Advised I could give 3 day note. He said that would not work. Will go to urgent care for follow up and get notes.

## 2012-12-25 NOTE — ED Provider Notes (Signed)
  Medical screening examination/treatment/procedure(s) were performed by non-physician practitioner and as supervising physician I was immediately available for consultation/collaboration.  EKG Interpretation   None          Dayzha Pogosyan, MD 12/25/12 0727 

## 2014-02-08 IMAGING — CR DG CHEST 2V
2 series · 2 of 2 positions shown · non-contrast
Comparison: None.

CLINICAL DATA: Chest pain

CHEST - 2 VIEW

[PA]
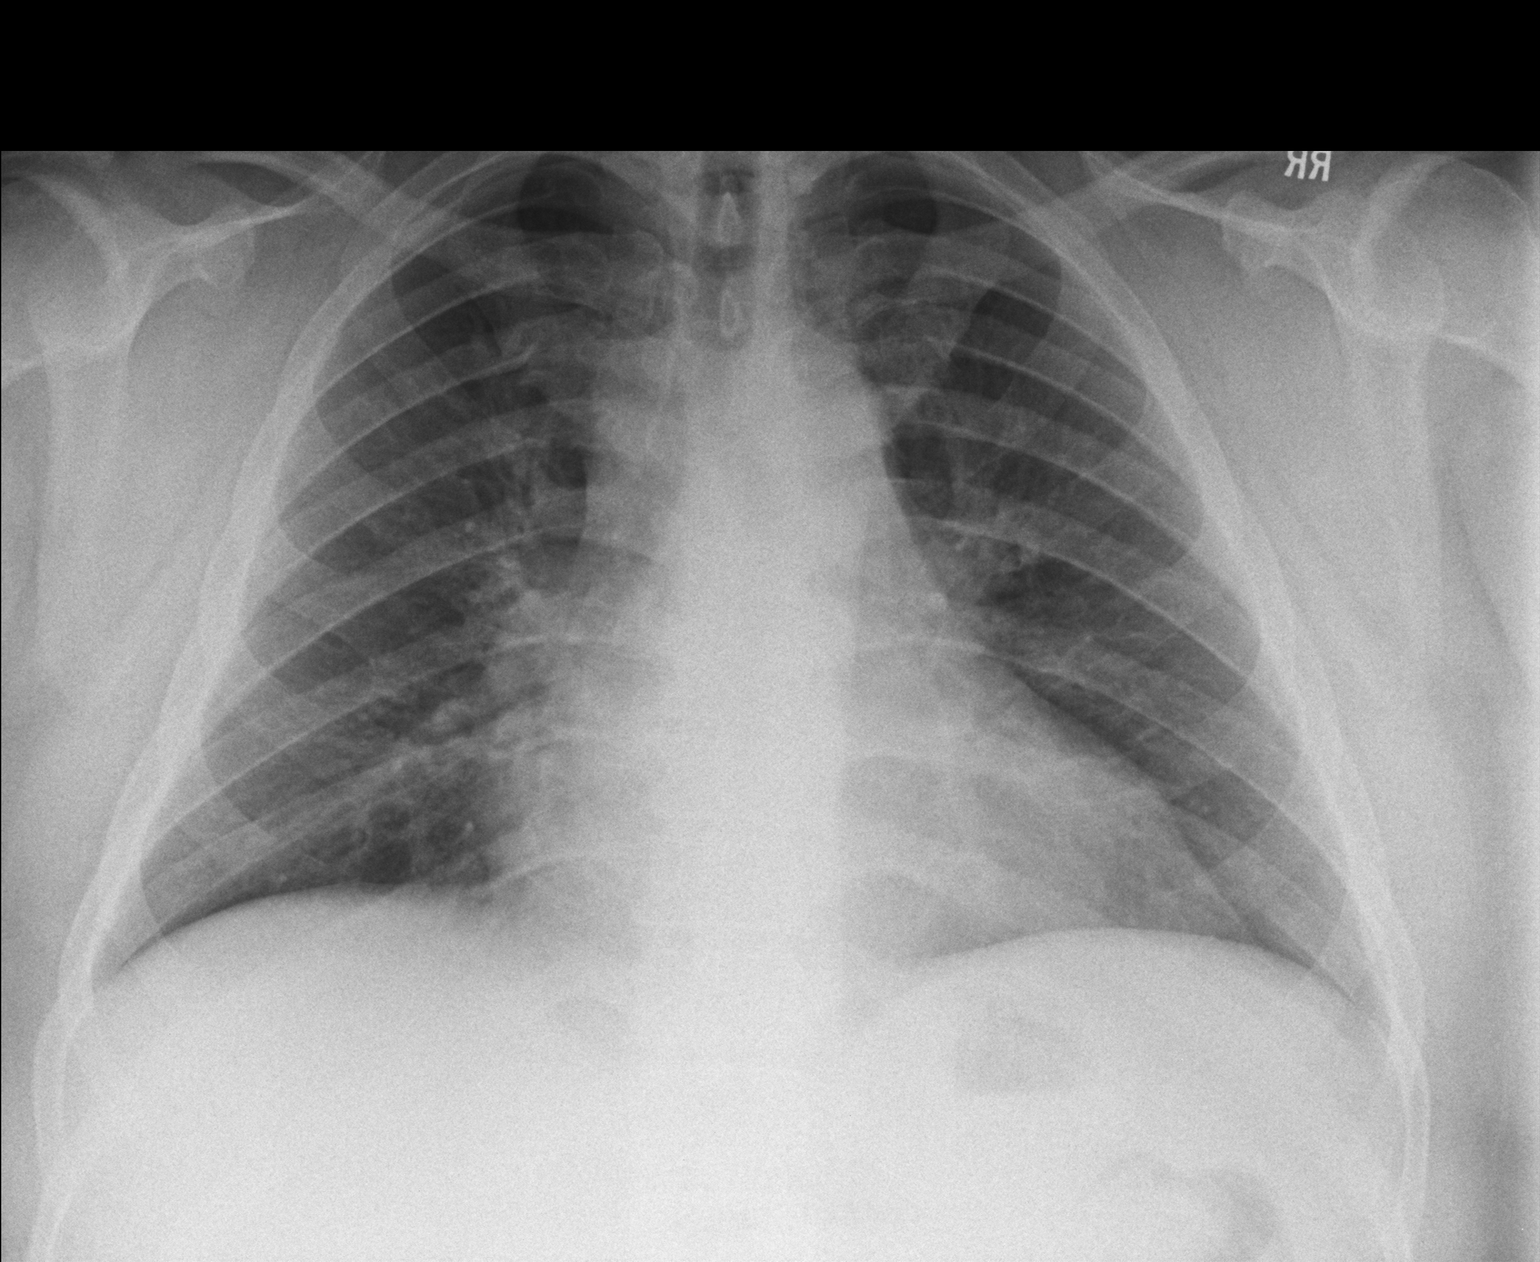

[lateral]
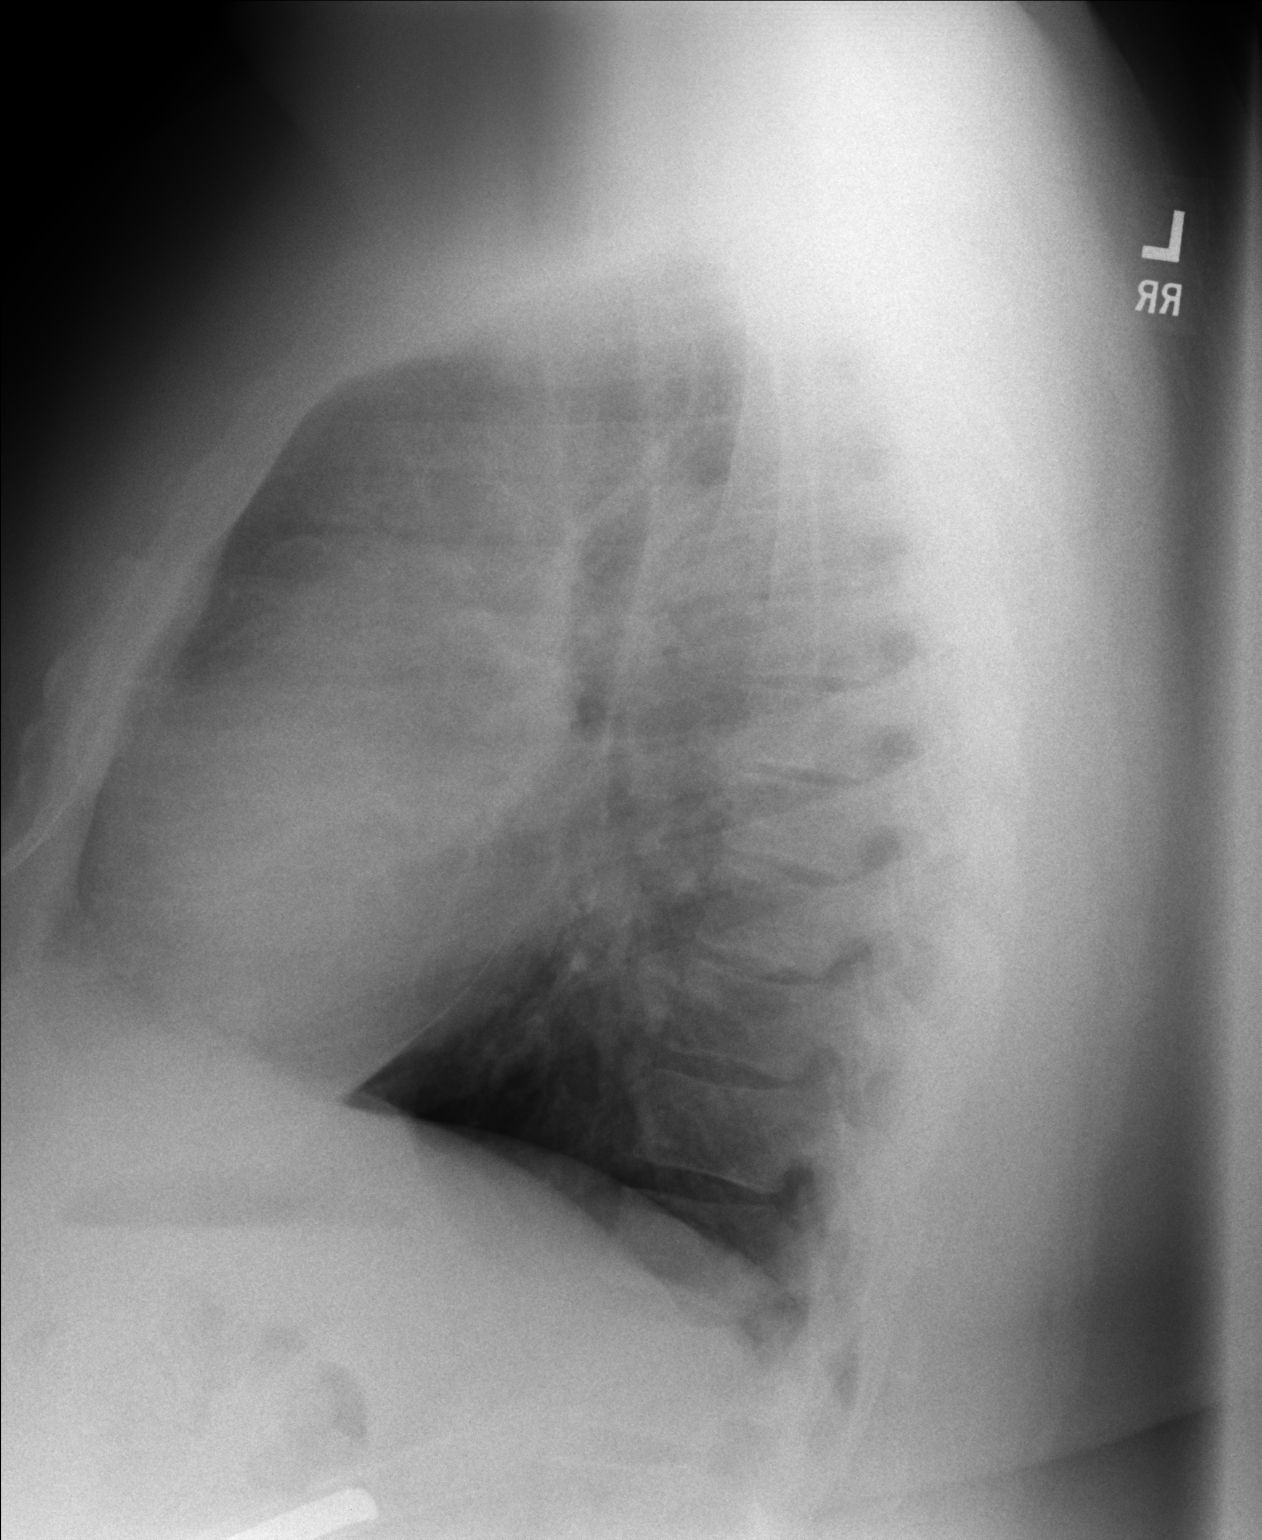

[2 of 2 positions shown; findings below may reference images not displayed]

FINDINGS: The lungs are clear.  The heart size and pulmonary
vascularity are within normal limits.  No adenopathy.  No bone
lesions.
IMPRESSION: Lungs clear.

## 2015-01-25 DIAGNOSIS — G4730 Sleep apnea, unspecified: Secondary | ICD-10-CM

## 2015-01-25 HISTORY — DX: Sleep apnea, unspecified: G47.30

## 2015-08-04 ENCOUNTER — Encounter (HOSPITAL_COMMUNITY): Payer: Self-pay

## 2015-08-04 ENCOUNTER — Emergency Department (HOSPITAL_COMMUNITY): Payer: Commercial Managed Care - HMO

## 2015-08-04 ENCOUNTER — Inpatient Hospital Stay (HOSPITAL_COMMUNITY)
Admission: EM | Admit: 2015-08-04 | Discharge: 2015-08-06 | DRG: 309 | Disposition: A | Discharge status: Home / Self Care | Payer: Commercial Managed Care - HMO | Attending: Interventional Cardiology | Admitting: Interventional Cardiology

## 2015-08-04 DIAGNOSIS — I959 Hypotension, unspecified: Secondary | ICD-10-CM | POA: Diagnosis present

## 2015-08-04 DIAGNOSIS — E1159 Type 2 diabetes mellitus with other circulatory complications: Secondary | ICD-10-CM

## 2015-08-04 DIAGNOSIS — E785 Hyperlipidemia, unspecified: Secondary | ICD-10-CM | POA: Diagnosis not present

## 2015-08-04 DIAGNOSIS — I4891 Unspecified atrial fibrillation: Secondary | ICD-10-CM | POA: Diagnosis not present

## 2015-08-04 DIAGNOSIS — Z0389 Encounter for observation for other suspected diseases and conditions ruled out: Secondary | ICD-10-CM

## 2015-08-04 DIAGNOSIS — I10 Essential (primary) hypertension: Secondary | ICD-10-CM

## 2015-08-04 DIAGNOSIS — I480 Paroxysmal atrial fibrillation: Secondary | ICD-10-CM | POA: Diagnosis present

## 2015-08-04 DIAGNOSIS — Z7984 Long term (current) use of oral hypoglycemic drugs: Secondary | ICD-10-CM

## 2015-08-04 DIAGNOSIS — E669 Obesity, unspecified: Secondary | ICD-10-CM | POA: Diagnosis present

## 2015-08-04 DIAGNOSIS — IMO0001 Reserved for inherently not codable concepts without codable children: Secondary | ICD-10-CM

## 2015-08-04 DIAGNOSIS — I110 Hypertensive heart disease with heart failure: Secondary | ICD-10-CM | POA: Diagnosis present

## 2015-08-04 DIAGNOSIS — R739 Hyperglycemia, unspecified: Secondary | ICD-10-CM

## 2015-08-04 DIAGNOSIS — Z79899 Other long term (current) drug therapy: Secondary | ICD-10-CM

## 2015-08-04 DIAGNOSIS — E860 Dehydration: Secondary | ICD-10-CM

## 2015-08-04 DIAGNOSIS — G4730 Sleep apnea, unspecified: Secondary | ICD-10-CM | POA: Diagnosis present

## 2015-08-04 DIAGNOSIS — E119 Type 2 diabetes mellitus without complications: Secondary | ICD-10-CM

## 2015-08-04 DIAGNOSIS — Z6841 Body Mass Index (BMI) 40.0 and over, adult: Secondary | ICD-10-CM

## 2015-08-04 DIAGNOSIS — E1165 Type 2 diabetes mellitus with hyperglycemia: Secondary | ICD-10-CM

## 2015-08-04 DIAGNOSIS — I509 Heart failure, unspecified: Secondary | ICD-10-CM | POA: Diagnosis present

## 2015-08-04 DIAGNOSIS — I4819 Other persistent atrial fibrillation: Secondary | ICD-10-CM | POA: Diagnosis present

## 2015-08-04 DIAGNOSIS — R531 Weakness: Secondary | ICD-10-CM | POA: Diagnosis not present

## 2015-08-04 HISTORY — DX: Unspecified atrial fibrillation: I48.91

## 2015-08-04 HISTORY — DX: Encounter for observation for other suspected diseases and conditions ruled out: Z03.89

## 2015-08-04 HISTORY — DX: Hyperlipidemia, unspecified: E78.5

## 2015-08-04 HISTORY — DX: Essential (primary) hypertension: I10

## 2015-08-04 HISTORY — DX: Dehydration: E86.0

## 2015-08-04 LAB — CBC
HEMATOCRIT: 45.8 % (ref 39.0–52.0)
Hemoglobin: 15.6 g/dL (ref 13.0–17.0)
MCH: 30.4 pg (ref 26.0–34.0)
MCHC: 34.1 g/dL (ref 30.0–36.0)
MCV: 89.1 fL (ref 78.0–100.0)
PLATELETS: 194 10*3/uL (ref 150–400)
RBC: 5.14 MIL/uL (ref 4.22–5.81)
RDW: 12.9 % (ref 11.5–15.5)
WBC: 11.9 10*3/uL — ABNORMAL HIGH (ref 4.0–10.5)

## 2015-08-04 LAB — BASIC METABOLIC PANEL
Anion gap: 11 (ref 5–15)
BUN: 24 mg/dL — AB (ref 6–20)
CHLORIDE: 97 mmol/L — AB (ref 101–111)
CO2: 23 mmol/L (ref 22–32)
CREATININE: 0.97 mg/dL (ref 0.61–1.24)
Calcium: 10 mg/dL (ref 8.9–10.3)
GFR calc non Af Amer: >60 mL/min (ref >60)
Glucose, Bld: 280 mg/dL — ABNORMAL HIGH (ref 65–99)
Potassium: 4 mmol/L (ref 3.5–5.1)
Sodium: 131 mmol/L — ABNORMAL LOW (ref 135–145)

## 2015-08-04 LAB — CBG MONITORING, ED: Glucose-Capillary: 278 mg/dL — ABNORMAL HIGH (ref 65–99)

## 2015-08-04 LAB — I-STAT TROPONIN, ED: Troponin i, poc: 0 ng/mL (ref 0.00–0.08)

## 2015-08-04 LAB — GLUCOSE, CAPILLARY: Glucose-Capillary: 306 mg/dL — ABNORMAL HIGH (ref 65–99)

## 2015-08-04 LAB — TROPONIN I: Troponin I: <0.03 ng/mL (ref <0.03)

## 2015-08-04 LAB — MAGNESIUM: MAGNESIUM: 2.1 mg/dL (ref 1.7–2.4)

## 2015-08-04 LAB — TSH: TSH: 2.369 u[IU]/mL (ref 0.350–4.500)

## 2015-08-04 MED ORDER — DILTIAZEM HCL 100 MG IV SOLR
5.0000 mg/h | INTRAVENOUS | Status: DC
  Administered 2015-08-04: 5 mg/h via INTRAVENOUS
  Filled 2015-08-04: qty 100

## 2015-08-04 MED ORDER — DILTIAZEM HCL 25 MG/5ML IV SOLN
10.0000 mg | Freq: Once | INTRAVENOUS | Status: DC
  Filled 2015-08-04: qty 5

## 2015-08-04 MED ORDER — AMIODARONE HCL IN DEXTROSE 360-4.14 MG/200ML-% IV SOLN
30.0000 mg/h | INTRAVENOUS | Status: DC
  Administered 2015-08-05: 30 mg/h via INTRAVENOUS
  Filled 2015-08-04: qty 200

## 2015-08-04 MED ORDER — ALPRAZOLAM 0.25 MG PO TABS: 0.2500 mg | ORAL_TABLET | Freq: Two times a day (BID) | ORAL | Status: DC | PRN

## 2015-08-04 MED ORDER — INSULIN ASPART 100 UNIT/ML ~~LOC~~ SOLN: 0.0000 [IU] | Freq: Every day | SUBCUTANEOUS | Status: DC

## 2015-08-04 MED ORDER — SODIUM CHLORIDE 0.9% FLUSH: 3.0000 mL | INTRAVENOUS | Status: DC | PRN

## 2015-08-04 MED ORDER — HEPARIN BOLUS VIA INFUSION
5000.0000 [IU] | Freq: Once | INTRAVENOUS | Status: AC
  Administered 2015-08-04: 5000 [IU] via INTRAVENOUS
  Filled 2015-08-04: qty 5000

## 2015-08-04 MED ORDER — ASPIRIN EC 81 MG PO TBEC
81.0000 mg | DELAYED_RELEASE_TABLET | Freq: Every day | ORAL | Status: DC
  Administered 2015-08-05 – 2015-08-06 (×2): 81 mg via ORAL
  Filled 2015-08-04 (×2): qty 1

## 2015-08-04 MED ORDER — SODIUM CHLORIDE 0.9 % IV SOLN
INTRAVENOUS | Status: DC
  Administered 2015-08-04 – 2015-08-05 (×3): via INTRAVENOUS

## 2015-08-04 MED ORDER — SODIUM CHLORIDE 0.9 % IV SOLN: 250.0000 mL | INTRAVENOUS | Status: DC | PRN

## 2015-08-04 MED ORDER — ZOLPIDEM TARTRATE 5 MG PO TABS: 5.0000 mg | ORAL_TABLET | Freq: Every evening | ORAL | Status: DC | PRN

## 2015-08-04 MED ORDER — SODIUM CHLORIDE 0.9 % IV BOLUS (SEPSIS)
500.0000 mL | Freq: Once | INTRAVENOUS | Status: AC
  Administered 2015-08-04: 500 mL via INTRAVENOUS

## 2015-08-04 MED ORDER — SODIUM CHLORIDE 0.9% FLUSH
3.0000 mL | Freq: Two times a day (BID) | INTRAVENOUS | Status: DC
  Administered 2015-08-04: 3 mL via INTRAVENOUS

## 2015-08-04 MED ORDER — AMIODARONE HCL IN DEXTROSE 360-4.14 MG/200ML-% IV SOLN
INTRAVENOUS | Status: AC
  Filled 2015-08-04: qty 200

## 2015-08-04 MED ORDER — ACETAMINOPHEN 325 MG PO TABS: 650.0000 mg | ORAL_TABLET | ORAL | Status: DC | PRN

## 2015-08-04 MED ORDER — HEPARIN (PORCINE) IN NACL 100-0.45 UNIT/ML-% IJ SOLN
1700.0000 [IU]/h | INTRAMUSCULAR | Status: DC
  Administered 2015-08-04 – 2015-08-05 (×2): 1700 [IU]/h via INTRAVENOUS
  Filled 2015-08-04 (×2): qty 250

## 2015-08-04 MED ORDER — NITROGLYCERIN 0.4 MG SL SUBL: 0.4000 mg | SUBLINGUAL_TABLET | SUBLINGUAL | Status: DC | PRN

## 2015-08-04 MED ORDER — INSULIN ASPART 100 UNIT/ML ~~LOC~~ SOLN: 0.0000 [IU] | Freq: Three times a day (TID) | SUBCUTANEOUS | Status: DC

## 2015-08-04 MED ORDER — AMIODARONE HCL IN DEXTROSE 360-4.14 MG/200ML-% IV SOLN
60.0000 mg/h | INTRAVENOUS | Status: AC
  Administered 2015-08-04: 60 mg/h via INTRAVENOUS

## 2015-08-04 MED ORDER — ONDANSETRON HCL 4 MG/2ML IJ SOLN: 4.0000 mg | Freq: Four times a day (QID) | INTRAMUSCULAR | Status: DC | PRN

## 2015-08-04 NOTE — ED Notes (Signed)
Check patient blood sugar it was 268 notified RN Jamie of blood sugar

## 2015-08-04 NOTE — ED Provider Notes (Addendum)
CSN: [id number]     Arrival date & time 08/04/15  1142 History   First MD Initiated Contact with Patient 08/04/15 1438     Chief Complaint  Patient presents with  . Weakness  . Dizziness   HPI Pt was outside working in the heat yesterday.  He started to feel lightheaded, dizzy.  He felt like he was getting overheated.  He went home and rested.   He went back to work today and he started to feel the same way.   No vision trouble.  No numbness or weakness.  NO vertigo.  He noticed that whenever he would try to stand up he would become lightheaded as if he might pass out. He went to an  urgent care Center today who noticed that he had an irregular heart rhythm rhythm. He was sent to the ED for further evaluation .   Pt denies any prior history of irregular heart rhythm.  He does drink alcohol regularly.   Generally a large beer each night. He has been cutting back to the smaller size beer.  Past Medical History  Diagnosis Date  . Allergy   . CHF (congestive heart failure) (HCC)   . Diabetes mellitus without complication (HCC)   . Hyperlipidemia   . Hypertension   . Atrial fibrillation (HCC) 08/04/2015    CHA2DS2-VASc = 3   Past Surgical History  Procedure Laterality Date  . Hernia repair    . Cardiac catheterization  2005   No family history on file. Social History  Substance Use Topics  . Smoking status: Never Smoker   . Smokeless tobacco: None  . Alcohol Use: Yes     Comment: occ    Review of Systems  All other systems reviewed and are negative.     Allergies  Review of patient's allergies indicates no known allergies.  Home Medications   Prior to Admission medications   Medication Sig Start Date End Date Taking? Authorizing Provider  aspirin 325 MG tablet Take 325 mg by mouth daily.   Yes Historical Provider, MD  glipiZIDE (GLUCOTROL XL) 10 MG 24 hr tablet Take 1 tablet (10 mg total) by mouth 2 (two) times daily. 08/27/12  Yes Collene Gobble, MD   lisinopril-hydrochlorothiazide (PRINZIDE,ZESTORETIC) 20-12.5 MG tablet Take 1 tablet by mouth daily.   Yes Historical Provider, MD  metFORMIN (GLUCOPHAGE) 1000 MG tablet Take 1 tablet (1,000 mg total) by mouth 2 (two) times daily with a meal. 08/25/12  Yes Collene Gobble, MD  sildenafil (VIAGRA) 100 MG tablet Take 0.5-1 tablets (50-100 mg total) by mouth daily as needed for erectile dysfunction. 03/03/12  Yes Collene Gobble, MD  atorvastatin (LIPITOR) 20 MG tablet Take 1 tablet (20 mg total) by mouth daily. Patient not taking: Reported on 08/04/2015 08/25/12   Collene Gobble, MD  HYDROcodone-acetaminophen (NORCO/VICODIN) 5-325 MG per tablet Take 1-2 tablets by mouth every 4 (four) hours as needed. Patient not taking: Reported on 08/04/2015 12/22/12   Nada Boozer Hess, PA-C  ipratropium (ATROVENT) 0.03 % nasal spray Place 2 sprays into the nose 4 (four) times daily. Patient not taking: Reported on 08/04/2015 09/11/12   Pearline Cables, MD  lisinopril-hydrochlorothiazide (PRINZIDE,ZESTORETIC) 20-25 MG per tablet Take 1 tablet by mouth daily. Patient not taking: Reported on 08/04/2015 08/25/12   Collene Gobble, MD   BP 110/77 mmHg  Pulse 117  Temp(Src) 98.2 F (36.8 C) (Oral)  Resp 20  SpO2 100% Physical Exam  Constitutional: He appears well-developed  and well-nourished. No distress.  HENT:  Head: Normocephalic and atraumatic.  Right Ear: External ear normal.  Left Ear: External ear normal.  Eyes: Conjunctivae are normal. Right eye exhibits no discharge. Left eye exhibits no discharge. No scleral icterus.  Neck: Neck supple. No tracheal deviation present.  Cardiovascular: Intact distal pulses.  An irregularly irregular rhythm present. Tachycardia present.   Pulmonary/Chest: Effort normal and breath sounds normal. No stridor. No respiratory distress. He has no wheezes. He has no rales.  Abdominal: Soft. Bowel sounds are normal. He exhibits no distension. There is no tenderness. There is no rebound and no  guarding.  Musculoskeletal: He exhibits no edema or tenderness.  Neurological: He is alert. He has normal strength. No cranial nerve deficit (no facial droop, extraocular movements intact, no slurred speech) or sensory deficit. He exhibits normal muscle tone. He displays no seizure activity. Coordination normal.  Skin: Skin is warm and dry. No rash noted.  Psychiatric: He has a normal mood and affect.  Nursing note and vitals reviewed.   ED Course  Procedures (including critical care time) Labs Review Labs Reviewed  BASIC METABOLIC PANEL - Abnormal; Notable for the following:    Sodium 131 (*)    Chloride 97 (*)    Glucose, Bld 280 (*)    BUN 24 (*)    All other components within normal limits  CBC - Abnormal; Notable for the following:    WBC 11.9 (*)    All other components within normal limits  CBG MONITORING, ED - Abnormal; Notable for the following:    Glucose-Capillary 278 (*)    All other components within normal limits  I-STAT TROPOININ, ED    Imaging Review No results found. I have personally reviewed and evaluated these images and lab results as part of my medical decision-making.   EKG Interpretation   Date/Time:  Tuesday August 04 2015 11:48:21 EDT Ventricular Rate:  126 PR Interval:    QRS Duration: 84 QT Interval:  322 QTC Calculation: 466 R Axis:   149 Text Interpretation:  Atrial fibrillation with rapid ventricular response  Right axis deviation Anterior infarct , age undetermined Abnormal ECG  Atrial fibrillation is new since last tracing Confirmed by Shonta Bourque  MD-J,  Merisa Julio ([id number]) on 08/04/2015 2:33:32 PM      MDM   Final diagnoses:  Atrial fibrillation, unspecified type (HCC)  Hyperglycemia    The patient presents to the emergency room with new onset atrial fibrillation. He appears comfortable in the emergency room in no distress. I planned on cardizem however his BP is soft and his rate is only 114 now.  Will monitor.  Patient noticed symptoms  within the last 24 hours however he has not specifically noticed the palpitations and tachycardia. I'm not sure that he is a good candidate for ED cardioversion for that reason. I will consult with cardiology.    Linwood Dibbles, MD 08/04/15 [phone]

## 2015-08-04 NOTE — Progress Notes (Signed)
Pt arrived on the unit at 1930. Pt only had one IV. Nurse unable to find vein. Order put in for IV team consult. HR not within goal. BP soft and unable to start Cardizem at this time.  2130 IV established. BP more stable. Cardizem started.  2236 paged Dr. Cardiology regarding HR and BP not within goal on Cardizem. Orders given to switch to Amio gtt per Dr. Shirlee Latch.

## 2015-08-04 NOTE — H&P (Addendum)
Patient ID: Juan Schwartz MRN: [id number], DOB/AGE: March 11, 1965   Admit date: 08/04/2015   Primary Physician: No PCP Per Patient Primary Cardiologist: New  HPI: 50 y/o overweight AA male with a history of HTN, untreated sleep apnea, daily ETOH ("2 beers a day"), HLD, and NIDDM. He was seen by cardiology in 2004 when he presented with chest pain and SOB. An echo initially showed an EF of 35%. He was cathed by Dr Clarene Duke and had normal coronaries and an EF of 50%. It was felt he had an episode of CHF then but he has not had any further episodes.         Yesterday the pt was working at his second job detailing cars. He began to get weak and decided to stop for the day. This morning he went to work (works outside) and as soon as it started to warm up he again felt weak. He denies any chest pain or dyspnea. He denies any palpations. In the ED his EKG shows AF with VR 126, poor ant RW. His B/P is a little low and his BUN was 24 suggesting he is mildly dehydrated.    Problem List: Past Medical History  Diagnosis Date  . Allergy   . CHF (congestive heart failure) (HCC) 2004    one episode  . Diabetes mellitus without complication (HCC)   . Hyperlipidemia   . Hypertension   . Atrial fibrillation (HCC) 08/04/2015    CHA2DS2-VASc = 3  . Normal coronary arteries 2004    Past Surgical History  Procedure Laterality Date  . Hernia repair    . Cardiac catheterization  2005     Allergies: No Known Allergies   Home Medications Prior to Admission medications   Medication Sig Start Date End Date Taking? Authorizing Provider  aspirin 325 MG tablet Take 325 mg by mouth daily.   Yes Historical Provider, MD  glipiZIDE (GLUCOTROL XL) 10 MG 24 hr tablet Take 1 tablet (10 mg total) by mouth 2 (two) times daily. 08/27/12  Yes Collene Gobble, MD  lisinopril-hydrochlorothiazide (PRINZIDE,ZESTORETIC) 20-12.5 MG tablet Take 1 tablet by mouth daily.   Yes Historical Provider, MD  metFORMIN (GLUCOPHAGE)  1000 MG tablet Take 1 tablet (1,000 mg total) by mouth 2 (two) times daily with a meal. 08/25/12  Yes Collene Gobble, MD  sildenafil (VIAGRA) 100 MG tablet Take 0.5-1 tablets (50-100 mg total) by mouth daily as needed for erectile dysfunction. 03/03/12  Yes Collene Gobble, MD  atorvastatin (LIPITOR) 20 MG tablet Take 1 tablet (20 mg total) by mouth daily. Patient not taking: Reported on 08/04/2015 08/25/12   Collene Gobble, MD  HYDROcodone-acetaminophen (NORCO/VICODIN) 5-325 MG per tablet Take 1-2 tablets by mouth every 4 (four) hours as needed. Patient not taking: Reported on 08/04/2015 12/22/12   Nada Boozer Hess, PA-C  ipratropium (ATROVENT) 0.03 % nasal spray Place 2 sprays into the nose 4 (four) times daily. Patient not taking: Reported on 08/04/2015 09/11/12   Pearline Cables, MD  lisinopril-hydrochlorothiazide (PRINZIDE,ZESTORETIC) 20-25 MG per tablet Take 1 tablet by mouth daily. Patient not taking: Reported on 08/04/2015 08/25/12   Collene Gobble, MD      Social History   Social History  . Marital Status: Married    Spouse Name: N/A  . Number of Children: N/A  . Years of Education: N/A   Occupational History  . Not on file.   Social History Main Topics  . Smoking status: Never Smoker   .  Smokeless tobacco: Not on file  . Alcohol Use: Yes     Comment: occ  . Drug Use: No  . Sexual Activity: Yes   Other Topics Concern  . Not on file   Social History Narrative   FM Hx-no family history of early CAD  Review of Systems: General: negative for chills, fever, night sweats or weight changes.  Cardiovascular: negative for chest pain, dyspnea on exertion, edema, orthopnea, palpitations, paroxysmal nocturnal dyspnea or shortness of breath HEENT: negative for any visual disturbances, blindness, glaucoma Dermatological: negative for rash Respiratory: negative for cough, hemoptysis, or wheezing Urologic: negative for hematuria or dysuria Abdominal: negative for nausea, vomiting, diarrhea, bright  red blood per rectum, melena, or hematemesis Neurologic: negative for visual changes, syncope, or dizziness Musculoskeletal: negative for back pain, joint pain, or swelling Psych: cooperative and appropriate All other systems reviewed and are otherwise negative except as noted above.  Physical Exam: Blood pressure 94/54, pulse 51, temperature 98.2 F (36.8 C), temperature source Oral, resp. rate 14, SpO2 100 %.  General appearance: alert, cooperative, no distress and morbidly obese Neck: no carotid bruit and no JVD Lungs: clear to auscultation bilaterally Heart: irregularly irregular rhythm Abdomen: obese, non tender Extremities: no edema Pulses: 2+ and symmetric Skin: Skin color, texture, turgor normal. No rashes or lesions Neurologic: Grossly normal    Labs:   Results for orders placed or performed during the hospital encounter of 08/04/15 (from the past 24 hour(s))  CBG monitoring, ED     Status: Abnormal   Collection Time: 08/04/15 11:52 AM  Result Value Ref Range   Glucose-Capillary 278 (H) 65 - 99 mg/dL  Basic metabolic panel     Status: Abnormal   Collection Time: 08/04/15 12:04 PM  Result Value Ref Range   Sodium 131 (L) 135 - 145 mmol/L   Potassium 4.0 3.5 - 5.1 mmol/L   Chloride 97 (L) 101 - 111 mmol/L   CO2 23 22 - 32 mmol/L   Glucose, Bld 280 (H) 65 - 99 mg/dL   BUN 24 (H) 6 - 20 mg/dL   Creatinine, Ser 1.61 0.61 - 1.24 mg/dL   Calcium 09.6 8.9 - 04.5 mg/dL   GFR calc non Af Amer >60 >60 mL/min   GFR calc Af Amer >60 >60 mL/min   Anion gap 11 5 - 15  CBC     Status: Abnormal   Collection Time: 08/04/15 12:04 PM  Result Value Ref Range   WBC 11.9 (H) 4.0 - 10.5 K/uL   RBC 5.14 4.22 - 5.81 MIL/uL   Hemoglobin 15.6 13.0 - 17.0 g/dL   HCT 40.9 81.1 - 91.4 %   MCV 89.1 78.0 - 100.0 fL   MCH 30.4 26.0 - 34.0 pg   MCHC 34.1 30.0 - 36.0 g/dL   RDW 78.2 95.6 - 21.3 %   Platelets 194 150 - 400 K/uL  Magnesium     Status: None   Collection Time: 08/04/15 12:04  PM  Result Value Ref Range   Magnesium 2.1 1.7 - 2.4 mg/dL  I-Stat Troponin, ED (not at [facility])     Status: None   Collection Time: 08/04/15 12:27 PM  Result Value Ref Range   Troponin i, poc 0.00 0.00 - 0.08 ng/mL   Comment 3             Radiology/Studies: Dg Chest Portable 1 View  08/04/2015  CLINICAL DATA:  Dizziness today.  History of atrial fibrillation. EXAM: PORTABLE CHEST 1 VIEW COMPARISON:  November 29, 2011 FINDINGS: The heart size and mediastinal contours are stable. The heart size is mildly enlarged. There is no focal infiltrate, pulmonary edema, or pleural effusion. The visualized skeletal structures are unremarkable. IMPRESSION: No active cardiopulmonary disease. Electronically Signed   By: Sherian Rein M.D.   On: 08/04/2015 15:31    EKG:AF, poor anterior RW, RAD  ASSESSMENT AND PLAN:  Principal Problem:   Atrial fibrillation, new onset (HCC) Active Problems:   Diabetes mellitus, type 2 (HCC)   Essential hypertension   Hypotension   Dehydration   Obesity   Sleep apnea-untreated   Normal coronary arteries-2004   Dyslipidemia   PLAN: Admit, hydrate, check echo, start Heparin, Diltiazem when B/P is > 100 systolic. Consider TEE CV tomorrow though he may need cath if he has significant LVD on TT echo. He should avoid ETOH. F/U sleep study as an OP.    Jolene Provost, PA-C 08/04/2015, 4:06 PM [phone] The patient has been seen in conjunction with Corine Shelter, PA-C. All aspects of care have been considered and discussed. The patient has been personally interviewed, examined, and all clinical data has been reviewed.   Atrial fibrillation with rapid ventricular response and relatively low blood pressure causing weakness and fatigue. No specific complaints of dyspnea. No prior history of atrial fibrillation.  Significant medical problems include hypertension, untreated sleep apnea, obesity, daily alcohol use, and prior history of congestive heart failure with low EF  recovering to normal.  Plan IV fluid watching for any evidence of pulmonary congestion, IV diltiazem to slow rate, IV heparin, TEE cardioversion in a.m. if he does not have spontaneous conversion.  TSH, sleep study, treatment of sleep apnea, and alcohol cessation will need to be achieved over time.  Transthoracic echo as soon as possible. Given right axis deviation on EKG, a d-dimer will be performed to exclude the possibility of PE.

## 2015-08-04 NOTE — Progress Notes (Signed)
ANTICOAGULATION CONSULT NOTE - Initial Consult  Pharmacy Consult for heparin  Indication: atrial fibrillation  No Known Allergies  Patient Measurements: Height: 5\' 9"  (175.3 cm) Weight: 270 lb (122.471 kg) IBW/kg (Calculated) : 70.7 Heparin Dosing Weight: 98   Vital Signs: Temp: 98.2 F (36.8 C) (07/11 1145) Temp Source: Oral (07/11 1145) BP: 102/74 mmHg (07/11 1635) Pulse Rate: 115 (07/11 1635)  Labs:  Recent Labs  08/04/15 1204  HGB 15.6  HCT 45.8  PLT 194  CREATININE 0.97    Estimated Creatinine Clearance: 120.4 mL/min (by C-G formula based on Cr of 0.97).   Medical History: Past Medical History  Diagnosis Date  . Allergy   . CHF (congestive heart failure) (HCC) 2004    one episode  . Diabetes mellitus without complication (HCC)   . Hyperlipidemia   . Hypertension   . Atrial fibrillation (HCC) 08/04/2015    CHA2DS2-VASc = 3  . Normal coronary arteries 2004    Medications:  Infusions:  . sodium chloride    . sodium chloride    . diltiazem (CARDIZEM) infusion    . heparin      Assessment: 36 yoM admitted with new onset atrial fibrillation. No known anticoagulation PTA. CBC stable. Pharmacy to assist with heparin dosing.    Goal of Therapy:  Heparin level 0.3-0.7 units/ml Monitor platelets by anticoagulation protocol: Yes   Plan:  1. Give 5000 units bolus x 1 2. Start heparin infusion at 1700 units/hr 3. Check anti-Xa level in 6 hours and daily while on heparin 4. Continue to monitor H&H and platelets  Pollyann Samples, PharmD, BCPS 08/04/2015, 5:03 PM Pager: [phone]

## 2015-08-04 NOTE — ED Notes (Addendum)
Patient works outside in the heat and experienced dizziness yesterday and went home and rested. This am when awoke was still dizzy and went to be checked at urgent care.  On arrival no CP, alert and oriented, steady gait on arrival.  Denies pain. Was sent here for HTN and was told his EKG was abnormal.

## 2015-08-05 ENCOUNTER — Observation Stay (HOSPITAL_COMMUNITY): Payer: Commercial Managed Care - HMO

## 2015-08-05 ENCOUNTER — Encounter (HOSPITAL_COMMUNITY): Payer: Self-pay | Admitting: Certified Registered Nurse Anesthetist

## 2015-08-05 ENCOUNTER — Observation Stay (HOSPITAL_COMMUNITY): Payer: Commercial Managed Care - HMO | Admitting: Certified Registered Nurse Anesthetist

## 2015-08-05 ENCOUNTER — Encounter (HOSPITAL_COMMUNITY)
Admission: EM | Disposition: A | Discharge status: Home / Self Care | Payer: Self-pay | Source: Home / Self Care | Attending: Interventional Cardiology

## 2015-08-05 DIAGNOSIS — Z79899 Other long term (current) drug therapy: Secondary | ICD-10-CM | POA: Diagnosis not present

## 2015-08-05 DIAGNOSIS — I959 Hypotension, unspecified: Secondary | ICD-10-CM

## 2015-08-05 DIAGNOSIS — I480 Paroxysmal atrial fibrillation: Secondary | ICD-10-CM | POA: Diagnosis not present

## 2015-08-05 DIAGNOSIS — E669 Obesity, unspecified: Secondary | ICD-10-CM | POA: Diagnosis present

## 2015-08-05 DIAGNOSIS — I10 Essential (primary) hypertension: Secondary | ICD-10-CM | POA: Diagnosis not present

## 2015-08-05 DIAGNOSIS — Z6841 Body Mass Index (BMI) 40.0 and over, adult: Secondary | ICD-10-CM | POA: Diagnosis not present

## 2015-08-05 DIAGNOSIS — Z7984 Long term (current) use of oral hypoglycemic drugs: Secondary | ICD-10-CM | POA: Diagnosis not present

## 2015-08-05 DIAGNOSIS — I4891 Unspecified atrial fibrillation: Secondary | ICD-10-CM | POA: Diagnosis present

## 2015-08-05 DIAGNOSIS — E860 Dehydration: Secondary | ICD-10-CM | POA: Diagnosis present

## 2015-08-05 DIAGNOSIS — I110 Hypertensive heart disease with heart failure: Secondary | ICD-10-CM | POA: Diagnosis present

## 2015-08-05 DIAGNOSIS — R531 Weakness: Secondary | ICD-10-CM | POA: Diagnosis present

## 2015-08-05 DIAGNOSIS — I509 Heart failure, unspecified: Secondary | ICD-10-CM | POA: Diagnosis present

## 2015-08-05 DIAGNOSIS — E785 Hyperlipidemia, unspecified: Secondary | ICD-10-CM

## 2015-08-05 DIAGNOSIS — E1165 Type 2 diabetes mellitus with hyperglycemia: Secondary | ICD-10-CM | POA: Diagnosis present

## 2015-08-05 DIAGNOSIS — G4730 Sleep apnea, unspecified: Secondary | ICD-10-CM | POA: Diagnosis present

## 2015-08-05 DIAGNOSIS — E1159 Type 2 diabetes mellitus with other circulatory complications: Secondary | ICD-10-CM | POA: Diagnosis not present

## 2015-08-05 HISTORY — PX: CARDIOVERSION: SHX1299

## 2015-08-05 HISTORY — PX: TEE WITHOUT CARDIOVERSION: SHX5443

## 2015-08-05 LAB — GLUCOSE, CAPILLARY
GLUCOSE-CAPILLARY: 248 mg/dL — AB (ref 65–99)
Glucose-Capillary: 273 mg/dL — ABNORMAL HIGH (ref 65–99)
Glucose-Capillary: 231 mg/dL — ABNORMAL HIGH (ref 65–99)
Glucose-Capillary: 248 mg/dL — ABNORMAL HIGH (ref 65–99)

## 2015-08-05 LAB — ECHOCARDIOGRAM COMPLETE
Height: 69 in
Weight: 4320 oz

## 2015-08-05 LAB — MRSA PCR SCREENING: MRSA by PCR: NEGATIVE

## 2015-08-05 SURGERY — CARDIOVERSION
Anesthesia: Monitor Anesthesia Care

## 2015-08-05 MED ORDER — AMIODARONE HCL IN DEXTROSE 360-4.14 MG/200ML-% IV SOLN
30.0000 mg/h | INTRAVENOUS | Status: DC
Start: 2015-08-05 — End: 2015-08-06
  Administered 2015-08-05 – 2015-08-06 (×2): 30 mg/h via INTRAVENOUS
  Filled 2015-08-05 (×2): qty 200

## 2015-08-05 MED ORDER — SODIUM CHLORIDE 0.9 % IV SOLN: INTRAVENOUS | Status: DC

## 2015-08-05 MED ORDER — MEPERIDINE HCL 100 MG/ML IJ SOLN: 6.2500 mg | INTRAMUSCULAR | Status: DC | PRN

## 2015-08-05 MED ORDER — SODIUM CHLORIDE 0.9 % IV SOLN: 250.0000 mL | INTRAVENOUS | Status: DC

## 2015-08-05 MED ORDER — PROPOFOL 500 MG/50ML IV EMUL
INTRAVENOUS | Status: DC | PRN
  Administered 2015-08-05: 75 ug/kg/min via INTRAVENOUS

## 2015-08-05 MED ORDER — SODIUM CHLORIDE 0.9% FLUSH: 3.0000 mL | Freq: Two times a day (BID) | INTRAVENOUS | Status: DC

## 2015-08-05 MED ORDER — BUTAMBEN-TETRACAINE-BENZOCAINE 2-2-14 % EX AERO
INHALATION_SPRAY | CUTANEOUS | Status: DC | PRN
  Administered 2015-08-05: 2 via TOPICAL

## 2015-08-05 MED ORDER — APIXABAN 5 MG PO TABS
5.0000 mg | ORAL_TABLET | Freq: Two times a day (BID) | ORAL | Status: DC
  Administered 2015-08-05 – 2015-08-06 (×3): 5 mg via ORAL
  Filled 2015-08-05 (×3): qty 1

## 2015-08-05 MED ORDER — PROMETHAZINE HCL 25 MG/ML IJ SOLN: 6.2500 mg | INTRAMUSCULAR | Status: DC | PRN

## 2015-08-05 MED ORDER — SODIUM CHLORIDE 0.9% FLUSH: 3.0000 mL | INTRAVENOUS | Status: DC | PRN

## 2015-08-05 MED ORDER — MIDAZOLAM HCL 2 MG/2ML IJ SOLN: 0.5000 mg | Freq: Once | INTRAMUSCULAR | Status: DC | PRN

## 2015-08-05 NOTE — Anesthesia Postprocedure Evaluation (Signed)
Anesthesia Post Note  Patient: VONG BARGER  Procedure(s) Performed: Procedure(s) (LRB): CARDIOVERSION (N/A) TRANSESOPHAGEAL ECHOCARDIOGRAM (TEE) (N/A)  Patient location during evaluation: Endoscopy Anesthesia Type: MAC Level of consciousness: awake and alert, oriented and patient cooperative Pain management: pain level controlled Vital Signs Assessment: post-procedure vital signs reviewed and stable Respiratory status: spontaneous breathing, nonlabored ventilation and respiratory function stable Cardiovascular status: blood pressure returned to baseline and stable Postop Assessment: no signs of nausea or vomiting Anesthetic complications: no    Last Vitals:  Filed Vitals:   08/05/15 1216 08/05/15 1245  BP: 97/74 111/69  Pulse: 66 70  Temp: 36.6 C   Resp: 13 16    Last Pain:  Filed Vitals:   08/05/15 1253  PainSc: 0-No pain                 Ronith Berti,E. Briggett Tuccillo

## 2015-08-05 NOTE — Anesthesia Preprocedure Evaluation (Addendum)
Anesthesia Evaluation  Patient identified by MRN, date of birth, ID band Patient awake    Reviewed: Allergy & Precautions, NPO status , Patient's Chart, lab work & pertinent test results  History of Anesthesia Complications Negative for: history of anesthetic complications  Airway Mallampati: III  TM Distance: >3 FB Neck ROM: Full    Dental  (+) Loose, Poor Dentition, Missing, Dental Advisory Given   Pulmonary sleep apnea (does not use CPAP) , COPD,  COPD inhaler,    breath sounds clear to auscultation       Cardiovascular hypertension, Pt. on medications (-) angina+ dysrhythmias Atrial Fibrillation  Rhythm:Irregular Rate:Normal  '04 ECHO: EF 35-40%   Neuro/Psych negative neurological ROS     GI/Hepatic negative GI ROS, Neg liver ROS,   Endo/Other  diabetes (glu 273), Oral Hypoglycemic AgentsMorbid obesity  Renal/GU negative Renal ROS     Musculoskeletal   Abdominal (+) + obese,   Peds  Hematology heparin   Anesthesia Other Findings   Reproductive/Obstetrics                          Anesthesia Physical Anesthesia Plan  ASA: III  Anesthesia Plan: MAC   Post-op Pain Management:    Induction: Intravenous  Airway Management Planned: Nasal Cannula  Additional Equipment:   Intra-op Plan:   Post-operative Plan:   Informed Consent: I have reviewed the patients History and Physical, chart, labs and discussed the procedure including the risks, benefits and alternatives for the proposed anesthesia with the patient or authorized representative who has indicated his/her understanding and acceptance.   Dental advisory given  Plan Discussed with: CRNA and Surgeon  Anesthesia Plan Comments: (Plan routine monitors, MAC)        Anesthesia Quick Evaluation

## 2015-08-05 NOTE — Progress Notes (Signed)
*  PRELIMINARY RESULTS* Echocardiogram 2D Echocardiogram has been performed.  Jeryl Columbia 08/05/2015, 2:28 PM

## 2015-08-05 NOTE — Progress Notes (Signed)
Return to unit from Endo .. drowsy but follow commands  Vs stable

## 2015-08-05 NOTE — Transfer of Care (Signed)
Immediate Anesthesia Transfer of Care Note  Patient: Juan Schwartz  Procedure(s) Performed: Procedure(s): CARDIOVERSION (N/A) TRANSESOPHAGEAL ECHOCARDIOGRAM (TEE) (N/A)  Patient Location: Endoscopy Unit  Anesthesia Type:MAC  Level of Consciousness: awake, alert , oriented and patient cooperative  Airway & Oxygen Therapy: Patient Spontanous Breathing and Patient connected to nasal cannula oxygen  Post-op Assessment: Report given to RN, Post -op Vital signs reviewed and stable and Patient moving all extremities X 4  Post vital signs: Reviewed and stable  Last Vitals:  Filed Vitals:   08/05/15 0700 08/05/15 0959  BP: 111/80   Pulse: 95 114  Temp: 36.7 C 36.7 C  Resp: 14 20    Last Pain:  Filed Vitals:   08/05/15 1001  PainSc: 0-No pain         Complications: No apparent anesthesia complications

## 2015-08-05 NOTE — Progress Notes (Addendum)
Pt has [employer] ins. Gave pt 30day free and 10.00 month copay card for eliquis.       Pt copay will be $100- prior auth not required

## 2015-08-05 NOTE — Progress Notes (Signed)
Inpatient Diabetes Program Recommendations  AACE/ADA: New Consensus Statement on Inpatient Glycemic Control (2015)  Target Ranges:  Prepandial:   less than 140 mg/dL      Peak postprandial:   less than 180 mg/dL (1-2 hours)      Critically ill patients:  140 - 180 mg/dL   Lab Results  Component Value Date   GLUCAP 231* 08/05/2015   HGBA1C 13.1 08/25/2012  Results for Juan Schwartz, Juan Schwartz (MRN [id number]) as of 08/05/2015 12:38  Ref. Range 08/04/2015 11:52 08/04/2015 21:45 08/05/2015 07:51 08/05/2015 12:19  Glucose-Capillary Latest Ref Range: 65-99 mg/dL 295 (H) 284 (H) 132 (H) 231 (H)    Review of Glycemic Control:  Note that last available A1C was in 2014 and was >13%.    Diabetes history: Type 2 diabetes Outpatient Diabetes medications: Glucotrol XL 10 mg bid, Metformin 1000 mg bid Current orders for Inpatient glycemic control:  Novolog moderate tid with meals and HS  Inpatient Diabetes Program Recommendations:    Please consider checking A1C to determine pre-hospitalization glycemic control.  Also please consider adding Lantus 24 units daily while patient is in the hospital.  Thanks, Beryl Meager, RN, BC-ADM Inpatient Diabetes Coordinator Pager [phone] (8a-5p)

## 2015-08-05 NOTE — Progress Notes (Signed)
ANTICOAGULATION CONSULT NOTE - Follow Up Consult  Pharmacy Consult for Heparin >> Apixaban Indication: atrial fibrillation  No Known Allergies  Patient Measurements: Height: 5\' 9"  (175.3 cm) Weight: 270 lb (122.471 kg) IBW/kg (Calculated) : 70.7  Vital Signs: Temp: 97.7 F (36.5 C) (07/12 1300) Temp Source: Oral (07/12 1216) BP: 112/83 mmHg (07/12 1330) Pulse Rate: 68 (07/12 1330)  Labs:  Recent Labs  08/04/15 1204 08/04/15 1940  HGB 15.6  --   HCT 45.8  --   PLT 194  --   CREATININE 0.97  --   TROPONINI  --  <0.03    Estimated Creatinine Clearance: 120.4 mL/min (by C-G formula based on Cr of 0.97).   Medications:  Heparin @ 1700 units/hr  Assessment: 49yom on heparin for afib s/p successful DCCV today. He is refusing labs so plan to switch to apixaban. Does not meet any criteria for dose reduction. CBC wnl.  Goal of Therapy:  Monitor platelets by anticoagulation protocol: Yes   Plan:  1) Apixaban 5mg  bid 2) Case management consult for cost 3) Will educate  Fredrik Rigger 08/05/2015,2:10 PM

## 2015-08-05 NOTE — Interval H&P Note (Signed)
History and Physical Interval Note:  08/05/2015 10:33 AM  Juan Schwartz  has presented today for surgery, with the diagnosis of AFIB  The various methods of treatment have been discussed with the patient and family. After consideration of risks, benefits and other options for treatment, the patient has consented to  Procedure(s): CARDIOVERSION (N/A) as a surgical intervention .  The patient's history has been reviewed, patient examined, no change in status, stable for surgery.  I have reviewed the patient's chart and labs.  Questions were answered to the patient's satisfaction.     [employer]

## 2015-08-05 NOTE — Progress Notes (Signed)
Offered bath, Pt stated his wife will assist in the bath. Tech gave Pt all bath supplies.

## 2015-08-05 NOTE — Progress Notes (Signed)
       Patient Name: Juan Schwartz Date of Encounter: 08/05/2015    SUBJECTIVE: Still in AF with RVR. Asymptomatic with reference to dyspnea, CP, and dizziness.  TELEMETRY:  AF with RVR Filed Vitals:   08/05/15 0535 08/05/15 0600 08/05/15 0640 08/05/15 0700  BP:  112/92  111/80  Pulse: 109 92 86 95  Temp:    98.1 F (36.7 C)  TempSrc:    Oral  Resp: 12 12 17 14  Height:      Weight:      SpO2: 100% 100% 100% 100%    Intake/Output Summary (Last 24 hours) at 08/05/15 0854 Last data filed at 08/05/15 0800  Gross per 24 hour  Intake 1718.66 ml  Output   1250 ml  Net 468.66 ml   LABS: Basic Metabolic Panel:  Recent Labs  08/04/15 1204  NA 131*  K 4.0  CL 97*  CO2 23  GLUCOSE 280*  BUN 24*  CREATININE 0.97  CALCIUM 10.0  MG 2.1   CBC:  Recent Labs  08/04/15 1204  WBC 11.9*  HGB 15.6  HCT 45.8  MCV 89.1  PLT 194   Cardiac Enzymes:  Recent Labs  08/04/15 1940  TROPONINI <0.03     Radiology/Studies:  CXR with NAD  Physical Exam: Blood pressure 111/80, pulse 95, temperature 98.1 F (36.7 C), temperature source Oral, resp. rate 14, height 5' 9" (1.753 m), weight 270 lb (122.471 kg), SpO2 100 %. Weight change:   Wt Readings from Last 3 Encounters:  08/04/15 270 lb (122.471 kg)  12/22/12 325 lb 11.2 oz (147.737 kg)  09/11/12 328 lb (148.78 kg)   Obese Stable respiratory pattern No gallop or rub Neuro intact  ASSESSMENT:  1. AF with RVR 2. Obesity 3. Suspected OSA, untreated 4. Prior H/O SHF.  Plan:  1. Continue IV amio 2. NPO 3. TEE/CV at 11A by Dr. Skains.  Signed, Malak Orantes W Blondie Riggsbee III 08/05/2015, 8:54 AM 

## 2015-08-05 NOTE — CV Procedure (Signed)
    Electrical Cardioversion Procedure Note KENDARIUS WHISENHUNT [id number] 06-06-1965  Procedure: Electrical Cardioversion Indications:  Atrial Fibrillation  Time Out: Verified patient identification, verified procedure,medications/allergies/relevent history reviewed, required imaging and test results available.  Performed  Procedure Details  The patient was NPO after midnight. Anesthesia was administered at the beside  by Dr.Jackson with propofol.  Cardioversion was performed with synchronized biphasic defibrillation via AP pads with 120 joules.  1 attempt(s) were performed.  The patient converted to normal sinus rhythm. The patient tolerated the procedure well   IMPRESSION:  Successful cardioversion of atrial fibrillation.  Continuing heparin IV, amiodarone IV  TEE - EF 45%, mildly reduced, diffuse hypokinesis. Trace MR.    Donato Schultz 08/05/2015, 11:32 AM

## 2015-08-05 NOTE — H&P (View-Only) (Signed)
       Patient Name: Juan Schwartz Date of Encounter: 08/05/2015    SUBJECTIVE: Still in AF with RVR. Asymptomatic with reference to dyspnea, CP, and dizziness.  TELEMETRY:  AF with RVR Filed Vitals:   08/05/15 0535 08/05/15 0600 08/05/15 0640 08/05/15 0700  BP:  112/92  111/80  Pulse: 109 92 86 95  Temp:    98.1 F (36.7 C)  TempSrc:    Oral  Resp: 12 12 17 14   Height:      Weight:      SpO2: 100% 100% 100% 100%    Intake/Output Summary (Last 24 hours) at 08/05/15 0854 Last data filed at 08/05/15 0800  Gross per 24 hour  Intake 1718.66 ml  Output   1250 ml  Net 468.66 ml   LABS: Basic Metabolic Panel:  Recent Labs  34/35/68 1204  NA 131*  K 4.0  CL 97*  CO2 23  GLUCOSE 280*  BUN 24*  CREATININE 0.97  CALCIUM 10.0  MG 2.1   CBC:  Recent Labs  08/04/15 1204  WBC 11.9*  HGB 15.6  HCT 45.8  MCV 89.1  PLT 194   Cardiac Enzymes:  Recent Labs  08/04/15 1940  TROPONINI <0.03     Radiology/Studies:  CXR with NAD  Physical Exam: Blood pressure 111/80, pulse 95, temperature 98.1 F (36.7 C), temperature source Oral, resp. rate 14, height 5\' 9"  (1.753 m), weight 270 lb (122.471 kg), SpO2 100 %. Weight change:   Wt Readings from Last 3 Encounters:  08/04/15 270 lb (122.471 kg)  12/22/12 325 lb 11.2 oz (147.737 kg)  09/11/12 328 lb (148.78 kg)   Obese Stable respiratory pattern No gallop or rub Neuro intact  ASSESSMENT:  1. AF with RVR 2. Obesity 3. Suspected OSA, untreated 4. Prior H/O SHF.  Plan:  1. Continue IV amio 2. NPO 3. TEE/CV at 11A by Dr. Anne Fu.  Signed, Lyn Records III 08/05/2015, 8:54 AM

## 2015-08-05 NOTE — Progress Notes (Signed)
  Echocardiogram Echocardiogram Transesophageal has been performed.  Tye Savoy 08/05/2015, 12:15 PM

## 2015-08-06 ENCOUNTER — Encounter: Payer: Self-pay | Admitting: *Deleted

## 2015-08-06 ENCOUNTER — Telehealth: Payer: Self-pay | Admitting: *Deleted

## 2015-08-06 ENCOUNTER — Other Ambulatory Visit: Payer: Self-pay | Admitting: Cardiology

## 2015-08-06 DIAGNOSIS — G4730 Sleep apnea, unspecified: Secondary | ICD-10-CM

## 2015-08-06 DIAGNOSIS — I480 Paroxysmal atrial fibrillation: Secondary | ICD-10-CM

## 2015-08-06 LAB — GLUCOSE, CAPILLARY
Glucose-Capillary: 287 mg/dL — ABNORMAL HIGH (ref 65–99)
Glucose-Capillary: 227 mg/dL — ABNORMAL HIGH (ref 65–99)

## 2015-08-06 MED ORDER — HYDROCHLOROTHIAZIDE 12.5 MG PO CAPS
12.5000 mg | ORAL_CAPSULE | Freq: Every day | ORAL | Status: DC
  Administered 2015-08-06: 12.5 mg via ORAL
  Filled 2015-08-06: qty 1

## 2015-08-06 MED ORDER — APIXABAN 5 MG PO TABS: 5.0000 mg | ORAL_TABLET | Freq: Two times a day (BID) | ORAL | Status: DC

## 2015-08-06 MED ORDER — LISINOPRIL 20 MG PO TABS
20.0000 mg | ORAL_TABLET | Freq: Every day | ORAL | Status: DC
  Administered 2015-08-06: 20 mg via ORAL
  Filled 2015-08-06: qty 1

## 2015-08-06 MED ORDER — METOPROLOL SUCCINATE ER 25 MG PO TB24
25.0000 mg | ORAL_TABLET | Freq: Every day | ORAL | Status: DC
  Administered 2015-08-06: 25 mg via ORAL
  Filled 2015-08-06: qty 1

## 2015-08-06 MED ORDER — METOPROLOL SUCCINATE ER 25 MG PO TB24: 25.0000 mg | ORAL_TABLET | Freq: Every day | ORAL | Status: DC

## 2015-08-06 MED ORDER — ACETAMINOPHEN 325 MG PO TABS: 650.0000 mg | ORAL_TABLET | ORAL | Status: DC | PRN

## 2015-08-06 NOTE — Progress Notes (Signed)
Dc instructions given to patient.  Pt verbalized understanding.  No s/s of any acute distress noted.  No c/o pain.  NSR.

## 2015-08-06 NOTE — Telephone Encounter (Signed)
Prior-Authorization for sleep study claim has been started with [facility].    Ref # [id number].   Response time 7-15 business days.

## 2015-08-06 NOTE — Discharge Summary (Signed)
Physician Discharge Summary       Patient ID: Juan Schwartz MRN: [id number] DOB/AGE: 1965/04/18 50 y.o.  Admit date: 08/04/2015 Discharge date: 08/06/2015 Primary Cardiologist:Dr. Katrinka Blazing   Discharge Diagnoses:  Principal Problem:   Atrial fibrillation, new onset [facility]) Active Problems:   Obesity   Diabetes mellitus, type 2 (HCC)   Sleep apnea-untreated   Normal coronary arteries-2004   Essential hypertension   Dyslipidemia   Hypotension   Dehydration   Atrial fibrillation [facility])   Discharged Condition: good  Procedures: Cardioversion by Dr. Anne Fu 08/05/15  Procedure Details  The patient was NPO after midnight. Anesthesia was administered at the beside by Dr.Jackson with propofol. Cardioversion was performed with synchronized biphasic defibrillation via AP pads with 120 joules. 1 attempt(s) were performed. The patient converted to normal sinus rhythm. The patient tolerated the procedure well   IMPRESSION:  Successful cardioversion of atrial fibrillation.  Continuing heparin IV, amiodarone IV  TEE - EF 45%, mildly reduced, diffuse hypokinesis. Trace MR.    TEE by Dr. Anne Fu 08/05/15 ------------------------------------------------------------------- Study Conclusions  - Left ventricle: Systolic function was mildly to moderately  reduced. The estimated ejection fraction was in the range of 40%  to 45%. Diffuse hypokinesis. - Left atrium: No evidence of thrombus in the atrial cavity or  appendage. No evidence of thrombus in the appendage. - Right atrium: No evidence of thrombus in the atrial cavity or  appendage. - Superior vena cava: The study excluded a thrombus  ECHO TTE 08/06/15: Study Conclusions  - Left ventricle: The cavity size was normal. There was moderate  concentric hypertrophy. Systolic function was mildly reduced. The  estimated ejection fraction was in the range of 45% to 50%.  Diffuse hypokinesis. Features are consistent with a  pseudonormal  left ventricular filling pattern, with concomitant abnormal  relaxation and increased filling pressure (grade 2 diastolic  dysfunction). Doppler parameters are consistent with elevated  ventricular end-diastolic filling pressure. - Aortic root: The aortic root was normal in size. - Mitral valve: Structurally normal valve. - Left atrium: The atrium was mildly dilated. - Right ventricle: The cavity size was normal. Wall thickness was  normal. Systolic function was normal. - Right atrium: The atrium was normal in size. - Tricuspid valve: There was trivial regurgitation. - Pulmonic valve: There was no regurgitation. - Pulmonary arteries: Systolic pressure was within the normal  range. - Inferior vena cava: The vessel was normal in size. - Pericardium, extracardiac: There was no pericardial effusion.  Hospital Course: 50 y/o overweight AA male with a history of HTN, untreated sleep apnea, daily ETOH ("2 beers a day"), HLD, and NIDDM. He was seen by cardiology in 2004 when he presented with chest pain and SOB. An echo initially showed an EF of 35%. He was cathed by Dr Clarene Duke and had normal coronaries and an EF of 50%. It was felt he had an episode of CHF then but he has not had any further episodes.   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 He began to get weak and decided to stop for the day. The morning of admit 08/04/15 he went to work (works outside) and as soon as it started to warm up he again felt weak. He denied any chest pain or dyspnea. He denied any palpations. In the ED his EKG shows AF with RVR @ 126, poor ant RW. His B/P is a little low and his BUN was 24 suggesting he is mildly dehydrated.  He was admitted  and hydrated, IV heparin and dilt.  It was noted he had sleep apnea and will need out pt sleep study.    His cardiac enzymes are negative.  His TSH was normal.  K+and Mg+ normal. He was eventually place on IV amiodarone and underwent  TEE and DCCV on 08/05/15.  EF was 45% with diffuse hypokinesis Tr MR.  DCCV was successful and pt has maintained SR since.    He was seen by Dr. Katrinka Blazing and amiodarone was stopped.  Metoprolol was added. His glucose off Metformin was elevated during hospitaltiztion.  We have added Eliquis for 4 week and no asprin for that time.  Plan for out pt sleep study.  He will follow up in office in 1-3 weeks with [name] in a fib clinic then back to Dr. Katrinka Blazing.      As pt works in heat and heavy physical job will keep out of work 1 week.   Consults: None  Significant Diagnostic Studies:  BMP Latest Ref Rng 08/04/2015 08/25/2012 11/29/2011  Glucose 65 - 99 mg/dL 220(U) 542(H) 062(B)  BUN 6 - 20 mg/dL 76(E) 13 19  Creatinine 0.61 - 1.24 mg/dL 8.31 5.17 6.16  Sodium 135 - 145 mmol/L 131(L) 136 134(L)  Potassium 3.5 - 5.1 mmol/L 4.0 3.9 4.0  Chloride 101 - 111 mmol/L 97(L) 101 100  CO2 22 - 32 mmol/L 23 23 22   Calcium 8.9 - 10.3 mg/dL 07.3 9.4 9.8   CBC Latest Ref Rng 08/04/2015 08/25/2012 11/29/2011  WBC 4.0 - 10.5 K/uL 11.9(H) 7.4 9.9  Hemoglobin 13.0 - 17.0 g/dL 71.0 14.0(A) 15.6  Hematocrit 39.0 - 52.0 % 45.8 44.3 50.2  Platelets 150 - 400 K/uL 194 - -     Troponin was negative  TSH 2.369  PORTABLE CHEST 1 VIEW  COMPARISON: November 29, 2011  FINDINGS: The heart size and mediastinal contours are stable. The heart size is mildly enlarged. There is no focal infiltrate, pulmonary edema, or pleural effusion. The visualized skeletal structures are unremarkable.  IMPRESSION: No active cardiopulmonary disease.     Discharge Exam: Blood pressure 96/63, pulse 64, temperature 98.3 F (36.8 C), temperature source Oral, resp. rate 20, height 5\' 9"  (1.753 m), weight 279 lb 1.6 oz (126.599 kg), SpO2 100 %. General:Pleasant affect, NAD, ambulating without problems.  Skin:Warm and dry, brisk capillary refill HEENT:normocephalic, sclera clear, mucus membranes moist Neck:supple, no JVD    Heart:S1S2 RRR without murmur, gallup, rub or click Lungs:clear without rales, rhonchi, or wheezes GYI:RSWN, non tender, + BS, do not palpate liver spleen or masses Ext:no lower ext edema, 2+ pedal pulses, 2+ radial pulses Neuro:alert and oriented, MAE, follows commands, + facial symmetry   Disposition: 01-Home or Self Care      Discharge Instructions    Amb referral to AFIB Clinic    Complete by:  As directed             Medication List    STOP taking these medications        aspirin 325 MG tablet      TAKE these medications        acetaminophen 325 MG tablet  Commonly known as:  TYLENOL  Take 2 tablets (650 mg total) by mouth every 4 (four) hours as needed for headache or mild pain.     apixaban 5 MG Tabs tablet  Commonly known as:  ELIQUIS  Take 1 tablet (5 mg total) by mouth 2 (two) times daily.     atorvastatin 20  MG tablet  Commonly known as:  LIPITOR  Take 1 tablet (20 mg total) by mouth daily.     glipiZIDE 10 MG 24 hr tablet  Commonly known as:  GLUCOTROL XL  Take 1 tablet (10 mg total) by mouth 2 (two) times daily.     HYDROcodone-acetaminophen 5-325 MG tablet  Commonly known as:  NORCO/VICODIN  Take 1-2 tablets by mouth every 4 (four) hours as needed.     ipratropium 0.03 % nasal spray  Commonly known as:  ATROVENT  Place 2 sprays into the nose 4 (four) times daily.     lisinopril-hydrochlorothiazide 20-12.5 MG tablet  Commonly known as:  PRINZIDE,ZESTORETIC  Take 1 tablet by mouth daily.     metFORMIN 1000 MG tablet  Commonly known as:  GLUCOPHAGE  Take 1 tablet (1,000 mg total) by mouth 2 (two) times daily with a meal.     metoprolol succinate 25 MG 24 hr tablet  Commonly known as:  TOPROL-XL  Take 1 tablet (25 mg total) by mouth daily.     sildenafil 100 MG tablet  Commonly known as:  VIAGRA  Take 0.5-1 tablets (50-100 mg total) by mouth daily as needed for erectile dysfunction.       Follow-up Information    Follow up with  CARROLL,DONNA, NP On 08/13/2015.   Specialties:  Nurse Practitioner, Cardiology   Why:  at 11:30 AM   Contact information:   [address] [place] [address] [phone]        Discharge Instructions:  Heart Healthy Diabetic Diet.  Your glucose, sugar, her is very elevated.  You need better control. Please see MD who ordered it for you.  No work until 08/17/15.  Do Not miss the Eliquis  Take twice a day very important to prevent strokes.    You will follow up in a fib clinic at [facility] then later with Dr. Katrinka Blazing.    Our office will schedule you for sleep study.  They will call when arranged.        Signed: Nada Boozer Nurse Practitioner-Certified [facility]: [facility] 08/06/2015, 2:33 PM  Time spent on discharge : > 30 minutes.  The patient has been seen in conjunction with Nada Boozer, PAC. All aspects of care have been considered and discussed. The patient has been personally interviewed, examined, and all clinical data has been reviewed.  Will DC amiodarone and add metoprolol.  Continue home meds including Lisinopril/Hctz, metformin, glipizide, and atorvastatin.  We are adding Eliquis for 4 week, and will hold aspirin until Eliquis stopped.  Needs OP Sleep study and f/u in office within 1-3 weeks.  Discharge today as above.

## 2015-08-06 NOTE — Discharge Instructions (Addendum)
Information on my medicine - ELIQUIS (apixaban)  This medication education was reviewed with me or my healthcare representative as part of my discharge preparation.  The pharmacist that spoke with me during my hospital stay was:  Fredrik Rigger, [facility]  Why was Eliquis prescribed for you? Eliquis was prescribed for you to reduce the risk of a blood clot forming that can cause a stroke if you have a medical condition called atrial fibrillation (a type of irregular heartbeat).  What do You need to know about Eliquis ? Take your Eliquis TWICE DAILY - one tablet in the morning and one tablet in the evening with or without food. If you have difficulty swallowing the tablet whole please discuss with your pharmacist how to take the medication safely.  Take Eliquis exactly as prescribed by your doctor and DO NOT stop taking Eliquis without talking to the doctor who prescribed the medication.  Stopping may increase your risk of developing a stroke.  Refill your prescription before you run out.  After discharge, you should have regular check-up appointments with your healthcare provider that is prescribing your Eliquis.  In the future your dose may need to be changed if your kidney function or weight changes by a significant amount or as you get older.  What do you do if you miss a dose? If you miss a dose, take it as soon as you remember on the same day and resume taking twice daily.  Do not take more than one dose of ELIQUIS at the same time to make up a missed dose.  Important Safety Information A possible side effect of Eliquis is bleeding. You should call your healthcare provider right away if you experience any of the following: ? Bleeding from an injury or your nose that does not stop. ? Unusual colored urine (red or dark brown) or unusual colored stools (red or black). ? Unusual bruising for unknown reasons. ? A serious fall or if you hit your head (even if there is no  bleeding).  Some medicines may interact with Eliquis and might increase your risk of bleeding or clotting while on Eliquis. To help avoid this, consult your healthcare provider or pharmacist prior to using any new prescription or non-prescription medications, including herbals, vitamins, non-steroidal anti-inflammatory drugs (NSAIDs) and supplements.  This website has more information on Eliquis (apixaban): http://www.eliquis.com/eliquis/home  Heart Healthy Diabetic Diet.  Your glucose, sugar, her is very elevated.  You need better control. Please see MD who ordered it for you.  No work until 08/17/15.  Do Not miss the Eliquis  Take twice a day very important to prevent strokes.    You will follow up in a fib clinic at [facility] then later with Dr. Katrinka Blazing.    Our office will schedule you for sleep study.  They will call when arranged.

## 2015-08-06 NOTE — Progress Notes (Signed)
Results for Juan Schwartz, Juan Schwartz (MRN [id number]) as of 08/06/2015 15:12  Ref. Range 08/05/2015 12:19 08/05/2015 16:01 08/05/2015 21:42 08/06/2015 09:02 08/06/2015 11:57  Glucose-Capillary Latest Ref Range: 65-99 mg/dL 157 (H) 262 (H) 035 (H) 287 (H) 227 (H)  CBGs continue to be greater than 180 mg/dl. Recommend adding basal insulin Lantus 12-15 units daily and continuing the Novolog MODERATE correction scale TID & HS if CBGs continue to be elevated.  Smith Mince RN BSN CDE

## 2015-08-06 NOTE — Progress Notes (Signed)
   Will DC amiodarone and add metoprolol.  Continue home meds including Lisinopril/Hctz, metformin, glipizide, and atorvastatin.  We are adding Eliquis for 4 week, and will hold aspirin until Eliquis stopped.  Needs OP Sleep study and f/u in office within 1-3 weeks.

## 2015-08-13 ENCOUNTER — Ambulatory Visit (HOSPITAL_COMMUNITY)
Admission: RE | Admit: 2015-08-13 | Discharge: 2015-08-13 | Disposition: A | Discharge status: Home / Self Care | Payer: Commercial Managed Care - HMO | Source: Ambulatory Visit | Attending: Nurse Practitioner | Admitting: Nurse Practitioner

## 2015-08-13 ENCOUNTER — Encounter (HOSPITAL_COMMUNITY): Payer: Self-pay | Admitting: Nurse Practitioner

## 2015-08-13 VITALS — BP 98/64 | HR 67 | Ht 69.0 in | Wt 292.6 lb

## 2015-08-13 DIAGNOSIS — Z79899 Other long term (current) drug therapy: Secondary | ICD-10-CM | POA: Insufficient documentation

## 2015-08-13 DIAGNOSIS — I4891 Unspecified atrial fibrillation: Secondary | ICD-10-CM | POA: Insufficient documentation

## 2015-08-13 DIAGNOSIS — Z7984 Long term (current) use of oral hypoglycemic drugs: Secondary | ICD-10-CM | POA: Insufficient documentation

## 2015-08-13 DIAGNOSIS — I509 Heart failure, unspecified: Secondary | ICD-10-CM | POA: Diagnosis not present

## 2015-08-13 DIAGNOSIS — Z7901 Long term (current) use of anticoagulants: Secondary | ICD-10-CM | POA: Diagnosis not present

## 2015-08-13 DIAGNOSIS — G4730 Sleep apnea, unspecified: Secondary | ICD-10-CM | POA: Diagnosis not present

## 2015-08-13 DIAGNOSIS — E785 Hyperlipidemia, unspecified: Secondary | ICD-10-CM | POA: Insufficient documentation

## 2015-08-13 DIAGNOSIS — E119 Type 2 diabetes mellitus without complications: Secondary | ICD-10-CM | POA: Insufficient documentation

## 2015-08-13 DIAGNOSIS — I110 Hypertensive heart disease with heart failure: Secondary | ICD-10-CM | POA: Diagnosis not present

## 2015-08-13 NOTE — Patient Instructions (Signed)
Your physician has recommended you make the following change in your medication:  1)Decrease lisinopril/hctz to half tablet daily

## 2015-08-14 ENCOUNTER — Encounter (HOSPITAL_COMMUNITY): Payer: Self-pay | Admitting: Nurse Practitioner

## 2015-08-14 NOTE — Progress Notes (Signed)
Patient ID: Juan Schwartz, male   DOB: 1965/05/05, 50 y.o.   MRN: [id number]     Primary Care Physician: No PCP Per Patient Referring Physician: [facility] F/U   Juan Schwartz is a 50 y.o. AA male with a h/o obesity, HTN, untreated sleep apnea, daily ETOH ("2 beers a day"), HLD, and NIDDM. He was seen by cardiology in 2004 when he presented with chest pain and SOB. An echo initially showed an EF of 35%. He was cathed by Dr Juan Schwartz and had normal coronaries and an EF of 50%. It was felt he had an episode of CHF then but he has not had any further episodes.   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 He began to get weak and decided to stop for the day. The morning of admit 08/04/15 he went to work (works outside) and as soon as it started to warm up he again felt weak. He denied any chest pain or dyspnea. He denied any palpations. In the ED his EKG shows AF with RVR @ 126, poor ant RW. His B/P is a little low and his BUN was 24 suggesting he is mildly dehydrated. He was admitted and hydrated, IV heparin and dilt. It was noted he had sleep apnea and will need out pt sleep study.   His cardiac enzymes were negative. His TSH was normal. K+and Mg+ normal. He was eventually place on IV amiodarone and underwent TEE and DCCV on 08/05/15. EF was 45% with diffuse hypokinesis Tr MR. DCCV was successful and pt has maintained SR since.   He was seen by Dr. Katrinka Schwartz and amiodarone was stopped. Metoprolol was added. His glucose off Metformin was elevated during hospitaltiztion. We have added Eliquis for 4 week and no asprin for that time. Plan for out pt sleep study. He will follow up in office in 1-3 weeks with Juan Schwartz in a fib clinic then back to Dr. Katrinka Schwartz.  F/u in afib clinic, 7/20. Pt states that he feels well. No return of afib. He returns to work next Tuesday and he does work outside in the heat. BP soft today at 98/64. He states that BP has been lower since  metoprolol was added. He is not symptomatic with this currently. He does have a sleep study pending. He is being compliant with eliquis and with a a chadsvasc score of at least 2(HYN, DM), it is felt that he should stay on anticoagulant.He has lost 150 lbs over the last couple of years, at one point weighting 468 lbs. He has plateaued with his weight  and plans to return to the gym and restart his walking 1-2 miles 2x a day. We discussed alcohol and afib connection and he has quit alcohol since hospitalization and plans  not drink alcohol going forward.    Today, he denies symptoms of palpitations, chest pain, shortness of breath, orthopnea, PND, lower extremity edema, dizziness, presyncope, syncope, or neurologic sequela. The patient is tolerating medications without difficulties and is otherwise without complaint today.   Past Medical History  Diagnosis Date  . Allergy   . CHF (congestive heart failure) (HCC) 2004    one episode  . Diabetes mellitus without complication (HCC)   . Hyperlipidemia   . Hypertension   . Atrial fibrillation (HCC) 08/04/2015    CHA2DS2-VASc = 3  . Normal coronary arteries 2004  . Hx of sleep apnea    Past Surgical History  Procedure Laterality Date  . Hernia repair    .  Cardiac catheterization  2005  . Cardioversion N/A 08/05/2015    Procedure: CARDIOVERSION;  Surgeon: Juan Bathe, MD;  Location: [facility] ENDOSCOPY;  Service: Cardiovascular;  Laterality: N/A;  . Tee without cardioversion N/A 08/05/2015    Procedure: TRANSESOPHAGEAL ECHOCARDIOGRAM (TEE);  Surgeon: Juan Bathe, MD;  Location: [facility] ENDOSCOPY;  Service: Cardiovascular;  Laterality: N/A;    Current Outpatient Prescriptions  Medication Sig Dispense Refill  . acetaminophen (TYLENOL) 325 MG tablet Take 2 tablets (650 mg total) by mouth every 4 (four) hours as needed for headache or mild pain.    Marland Kitchen apixaban (ELIQUIS) 5 MG TABS tablet Take 1 tablet (5 mg total) by mouth 2 (two) times daily. 60 tablet 3    . atorvastatin (LIPITOR) 20 MG tablet Take 1 tablet (20 mg total) by mouth daily. 90 tablet 3  . glipiZIDE (GLUCOTROL XL) 10 MG 24 hr tablet Take 1 tablet (10 mg total) by mouth 2 (two) times daily. 60 tablet 3  . lisinopril-hydrochlorothiazide (PRINZIDE,ZESTORETIC) 20-12.5 MG tablet Take 0.5 tablets by mouth daily.    . metFORMIN (GLUCOPHAGE) 1000 MG tablet Take 1 tablet (1,000 mg total) by mouth 2 (two) times daily with a meal. 180 tablet 3  . metoprolol succinate (TOPROL-XL) 25 MG 24 hr tablet Take 1 tablet (25 mg total) by mouth daily. 30 tablet 6  . sildenafil (VIAGRA) 100 MG tablet Take 0.5-1 tablets (50-100 mg total) by mouth daily as needed for erectile dysfunction. 5 tablet 11  . HYDROcodone-acetaminophen (NORCO/VICODIN) 5-325 MG per tablet Take 1-2 tablets by mouth every 4 (four) hours as needed. (Patient not taking: Reported on 08/04/2015) 8 tablet 0  . ipratropium (ATROVENT) 0.03 % nasal spray Place 2 sprays into the nose 4 (four) times daily. (Patient not taking: Reported on 08/04/2015) 30 mL 6   No current facility-administered medications for this encounter.    No Known Allergies  Social History   Social History  . Marital Status: Married    Spouse Name: N/A  . Number of Children: N/A  . Years of Education: N/A   Occupational History  . Not on file.   Social History Main Topics  . Smoking status: Never Smoker   . Smokeless tobacco: Not on file  . Alcohol Use: Yes     Comment: occ  . Drug Use: No  . Sexual Activity: Yes   Other Topics Concern  . Not on file   Social History Narrative    No family history on file.  ROS- All systems are reviewed and negative except as per the HPI above  Physical Exam: Filed Vitals:   08/13/15 1127  BP: 98/64  Pulse: 67  Height:  (1.753 m)  Weight: 292 lb 9.6 oz (132.722 kg)    GEN- The patient is well appearing, alert and oriented x 3 today.   Head- normocephalic, atraumatic Eyes-  Sclera clear, conjunctiva  pink Ears- hearing intact Oropharynx- clear Neck- supple, no JVP Lymph- no cervical lymphadenopathy Lungs- Clear to ausculation bilaterally, normal work of breathing Heart- Regular rate and rhythm, no murmurs, rubs or gallops, PMI not laterally displaced GI- soft, NT, ND, + BS Extremities- no clubbing, cyanosis, or edema MS- no significant deformity or atrophy Skin- no rash or lesion Psych- euthymic mood, full affect Neuro- strength and sensation are intact  EKG-NSR, 67 bpm, Pr int 178 bpm, qrs int 88 ms, qtc 418 ms Epic records reviewed  Assessment and Plan: 1. Afib Successful cardioversion Continue with metoprolol Continue with apixaban long  term with chdsvasc score of two(htn, DM)  2. HTN BP currently soft with sys around 100 Not symptomatic at this point but he does return to work next Tuesday and works outside in the heat Cut lisinopril/hct in half Make sure to adequately hydrate  3. Risk factor modification Continue with weight loss efforts Weight loss class recommended Sleep study pending Avoid alcohol   F/u Monday for BP check Appointment requested with Dr. Audrea Muscat C. [facility] [address] [place], [place] [address] [phone]

## 2015-08-17 ENCOUNTER — Encounter (HOSPITAL_COMMUNITY): Payer: Self-pay | Admitting: Nurse Practitioner

## 2015-08-17 ENCOUNTER — Ambulatory Visit (HOSPITAL_COMMUNITY)
Admission: RE | Admit: 2015-08-17 | Discharge: 2015-08-17 | Disposition: A | Discharge status: Home / Self Care | Payer: Commercial Managed Care - HMO | Source: Ambulatory Visit | Attending: Nurse Practitioner | Admitting: Nurse Practitioner

## 2015-08-17 DIAGNOSIS — I480 Paroxysmal atrial fibrillation: Secondary | ICD-10-CM | POA: Diagnosis present

## 2015-08-17 NOTE — Progress Notes (Signed)
Pt in for EKG, BP check.  EKG showed Sinus Rhythm and HR of 75.  BP today 114/64. [name], NP to review EKG

## 2015-08-18 ENCOUNTER — Other Ambulatory Visit: Payer: Self-pay | Admitting: *Deleted

## 2015-08-18 DIAGNOSIS — G4733 Obstructive sleep apnea (adult) (pediatric): Secondary | ICD-10-CM

## 2015-09-24 ENCOUNTER — Encounter (HOSPITAL_BASED_OUTPATIENT_CLINIC_OR_DEPARTMENT_OTHER): Payer: Commercial Managed Care - HMO

## 2015-10-27 ENCOUNTER — Ambulatory Visit (INDEPENDENT_AMBULATORY_CARE_PROVIDER_SITE_OTHER): Payer: Commercial Managed Care - HMO | Admitting: Interventional Cardiology

## 2015-10-27 ENCOUNTER — Encounter: Payer: Self-pay | Admitting: Interventional Cardiology

## 2015-10-27 VITALS — BP 112/68 | HR 75 | Ht 69.0 in | Wt 298.8 lb

## 2015-10-27 DIAGNOSIS — E1122 Type 2 diabetes mellitus with diabetic chronic kidney disease: Secondary | ICD-10-CM

## 2015-10-27 DIAGNOSIS — E785 Hyperlipidemia, unspecified: Secondary | ICD-10-CM | POA: Diagnosis not present

## 2015-10-27 DIAGNOSIS — I10 Essential (primary) hypertension: Secondary | ICD-10-CM

## 2015-10-27 DIAGNOSIS — N183 Chronic kidney disease, stage 3 (moderate): Secondary | ICD-10-CM

## 2015-10-27 DIAGNOSIS — I4891 Unspecified atrial fibrillation: Secondary | ICD-10-CM

## 2015-10-27 DIAGNOSIS — Z7901 Long term (current) use of anticoagulants: Secondary | ICD-10-CM

## 2015-10-27 NOTE — Progress Notes (Signed)
Cardiology Office Note    Date:  10/27/2015   ID:  Juan Schwartz, [name] 03-18-1965, MRN [id number]  PCP:  No PCP Per Patient  Cardiologist: Lesleigh Noe, MD   Chief Complaint  Patient presents with  . Atrial Fibrillation    History of Present Illness:  Juan Schwartz is a 50 y.o. male  with history of paroxysmal atrial fibrillation, electrical cardioversion in July 2017, anticoagulation started that time, mild reduction in LV function with mildly depressed LVEF in the 45% range, and no recurrence of palpitations or symptoms since discharge from the hospital 08/06/2015.  A sleep study was scheduled which has not yet been performed. He denies chest pain. No palpitations or significant dyspnea. Aspirin was discontinued because of of Eliquis.  Past Medical History:  Diagnosis Date  . Allergy   . Atrial fibrillation (HCC) 08/04/2015   CHA2DS2-VASc = 3  . CHF (congestive heart failure) (HCC) 2004   one episode  . Diabetes mellitus without complication (HCC)   . Hx of sleep apnea   . Hyperlipidemia   . Hypertension   . Normal coronary arteries 2004    Past Surgical History:  Procedure Laterality Date  . CARDIAC CATHETERIZATION  2005  . CARDIOVERSION N/A 08/05/2015   Procedure: CARDIOVERSION;  Surgeon: Jake Bathe, MD;  Location: [facility] ENDOSCOPY;  Service: Cardiovascular;  Laterality: N/A;  . HERNIA REPAIR    . TEE WITHOUT CARDIOVERSION N/A 08/05/2015   Procedure: TRANSESOPHAGEAL ECHOCARDIOGRAM (TEE);  Surgeon: Jake Bathe, MD;  Location: [facility] ENDOSCOPY;  Service: Cardiovascular;  Laterality: N/A;    Current Medications: Outpatient Medications Prior to Visit  Medication Sig Dispense Refill  . acetaminophen (TYLENOL) 325 MG tablet Take 2 tablets (650 mg total) by mouth every 4 (four) hours as needed for headache or mild pain.    Marland Kitchen apixaban (ELIQUIS) 5 MG TABS tablet Take 1 tablet (5 mg total) by mouth 2 (two) times daily. 60 tablet 3  . atorvastatin (LIPITOR) 20 MG  tablet Take 1 tablet (20 mg total) by mouth daily. 90 tablet 3  . glipiZIDE (GLUCOTROL XL) 10 MG 24 hr tablet Take 1 tablet (10 mg total) by mouth 2 (two) times daily. 60 tablet 3  . ipratropium (ATROVENT) 0.03 % nasal spray Place 2 sprays into the nose 4 (four) times daily. 30 mL 6  . lisinopril-hydrochlorothiazide (PRINZIDE,ZESTORETIC) 20-12.5 MG tablet Take 0.5 tablets by mouth daily.    . metFORMIN (GLUCOPHAGE) 1000 MG tablet Take 1 tablet (1,000 mg total) by mouth 2 (two) times daily with a meal. 180 tablet 3  . metoprolol succinate (TOPROL-XL) 25 MG 24 hr tablet Take 1 tablet (25 mg total) by mouth daily. 30 tablet 6  . sildenafil (VIAGRA) 100 MG tablet Take 0.5-1 tablets (50-100 mg total) by mouth daily as needed for erectile dysfunction. 5 tablet 11   No facility-administered medications prior to visit.      Allergies:   Review of patient's allergies indicates no known allergies.   Social History   Social History  . Marital status: Married    Spouse name: N/A  . Number of children: N/A  . Years of education: N/A   Social History Main Topics  . Smoking status: Former Games developer  . Smokeless tobacco: Never Used  . Alcohol use Yes     Comment: occ  . Drug use: No  . Sexual activity: Yes   Other Topics Concern  . None   Social History Narrative  . None  Family History:  The patient's family history includes Diabetes in his mother.   ROS:   Please see the history of present illness.    No complaints  All other systems reviewed and are negative.   PHYSICAL EXAM:   VS:  BP 112/68   Pulse 75   Ht 5\' 9"  (1.753 m)   Wt 298 lb 12.8 oz (135.5 kg)   BMI 44.13 kg/m    GEN: Well nourished, well developed, in no acute distress . Obese HEENT: normal  Neck: no JVD, carotid bruits, or masses Cardiac: RRR; no murmurs, rubs, or gallops,no edema  Respiratory:  clear to auscultation bilaterally, normal work of breathing GI: soft, nontender, nondistended, + BS MS: no deformity  or atrophy  Skin: warm and dry, no rash Neuro:  Alert and Oriented x 3, Strength and sensation are intact Psych: euthymic mood, full affect  Wt Readings from Last 3 Encounters:  10/27/15 298 lb 12.8 oz (135.5 kg)  08/17/15 283 lb 6.4 oz (128.5 kg)  08/13/15 292 lb 9.6 oz (132.7 kg)      Studies/Labs Reviewed:   EKG:  EKG  Not repeated  Recent Labs: 08/04/2015: BUN 24; Creatinine, Ser 0.97; Hemoglobin 15.6; Magnesium 2.1; Platelets 194; Potassium 4.0; Sodium 131; TSH 2.369   Lipid Panel    Component Value Date/Time   CHOL 182 08/25/2012 0818   TRIG 193 (H) 08/25/2012 0818   HDL 48 08/25/2012 0818   CHOLHDL 3.8 08/25/2012 0818   VLDL 39 08/25/2012 0818   LDLCALC 95 08/25/2012 0818    Additional studies/ records that were reviewed today include:  Echo results reviewed    ASSESSMENT:    1. Atrial fibrillation, new onset (HCC)   2. Essential hypertension   3. Type 2 diabetes mellitus with stage 3 chronic kidney disease, unspecified long term insulin use status (HCC)   4. Dyslipidemia   5. Chronic anticoagulation      PLAN:  In order of problems listed above:  1. A. fib is resolved and has not recurred after cardioversion. 2. Blood pressure is under excellent control 3. Diabetes is being managed by primary care 4. Not address this visit 5. I will await the results of his sleep study. His CHADS score is 3 (CHF, diabetes, hypertension) would suggest that chronic anticoagulation is needed. Certainly if he has sleep apnea which is a polyp for trigger for recurrent AF, I will continue anticoagulation.    Medication Adjustments/Labs and Tests Ordered: Current medicines are reviewed at length with the patient today.  Concerns regarding medicines are outlined above.  Medication changes, Labs and Tests ordered today are listed in the Patient Instructions below. Patient Instructions  Medication Instructions:  Your physician recommends that you continue on your current  medications as directed. Please refer to the Current Medication list given to you today.   Labwork: none  Testing/Procedures: none  Follow-Up: Your physician wants you to follow-up in: 3 months.  You will receive a reminder letter in the mail two months in advance. If you don't receive a letter, please call our office to schedule the follow-up appointment.      Any Other Special Instructions Will Be Listed Below (If Applicable).     If you need a refill on your cardiac medications before your next appointment, please call your pharmacy.      Signed, Lesleigh NoeHenry W Clearnce Leja III, MD  10/27/2015 10:16 AM    [facility] [address], [place], [place]  [address]  Phone: [phone]; Fax: [phone]

## 2015-10-27 NOTE — Patient Instructions (Addendum)
Medication Instructions:  Your physician recommends that you continue on your current medications as directed. Please refer to the Current Medication list given to you today.   Labwork: none  Testing/Procedures: none  Follow-Up: Your physician wants you to follow-up in: 3 months.  You will receive a reminder letter in the mail two months in advance. If you don't receive a letter, please call our office to schedule the follow-up appointment.      Any Other Special Instructions Will Be Listed Below (If Applicable).     If you need a refill on your cardiac medications before your next appointment, please call your pharmacy.

## 2015-11-08 ENCOUNTER — Ambulatory Visit (HOSPITAL_BASED_OUTPATIENT_CLINIC_OR_DEPARTMENT_OTHER): Payer: Commercial Managed Care - HMO | Attending: Nurse Practitioner | Admitting: Cardiology

## 2015-11-08 VITALS — Ht 69.0 in | Wt 271.0 lb

## 2015-11-08 DIAGNOSIS — G4733 Obstructive sleep apnea (adult) (pediatric): Secondary | ICD-10-CM | POA: Diagnosis not present

## 2015-11-08 DIAGNOSIS — R0683 Snoring: Secondary | ICD-10-CM | POA: Diagnosis not present

## 2015-11-08 DIAGNOSIS — I4819 Other persistent atrial fibrillation: Secondary | ICD-10-CM

## 2015-11-08 DIAGNOSIS — I481 Persistent atrial fibrillation: Secondary | ICD-10-CM | POA: Insufficient documentation

## 2015-11-11 NOTE — Procedures (Signed)
   Patient Name: Mcclain, Ottinger Date: 11/08/2015 Gender: Male D.O.B: March 18, 1965 Age (years): 49 Referring Provider: Newman Nip Height (inches): 69 Interpreting Physician: Armanda Magic MD, ABSM Weight (lbs): 271 RPSGT: Wylie Hail BMI: 40 MRN: [id number] Neck Size: 17.50  CLINICAL INFORMATION Sleep Study Type: NPSG Indication for sleep study: OSA Epworth Sleepiness Score: 3  SLEEP STUDY TECHNIQUE As per the AASM Manual for the Scoring of Sleep and Associated Events v2.3 (April 2016) with a hypopnea requiring 4% desaturations. The channels recorded and monitored were frontal, central and occipital EEG, electrooculogram (EOG), submentalis EMG (chin), nasal and oral airflow, thoracic and abdominal wall motion, anterior tibialis EMG, snore microphone, electrocardiogram, and pulse oximetry.  MEDICATIONS Medications self-administered by patient taken the night of the study : N/A  SLEEP ARCHITECTURE The study was initiated at 10:09:23 PM and ended at 4:40:54 AM. Sleep onset time was 55.1 minutes and the sleep efficiency was 81.2%. The total sleep time was 317.9 minutes. Stage REM latency was 62.0 minutes. The patient spent 9.28% of the night in stage N1 sleep, 72.01% in stage N2 sleep, 0.00% in stage N3 and 18.72% in REM. Alpha intrusion was absent. Supine sleep was 28.47%.  RESPIRATORY PARAMETERS The overall apnea/hypopnea index (AHI) was 7.7 per hour. There were 6 total apneas, including 4 obstructive, 2 central and 0 mixed apneas. There were 35 hypopneas and 9 RERAs. The AHI during Stage REM sleep was 34.3 per hour. AHI while supine was 14.6 per hour. The mean oxygen saturation was 96.96%. The minimum SpO2 during sleep was 84.00%. Loud snoring was noted during this study.  CARDIAC DATA The 2 lead EKG demonstrated sinus rhythm. The mean heart rate was 98.53 beats per minute. Other EKG findings include: None.  LEG MOVEMENT DATA The total PLMS were 0 with a  resulting PLMS index of 0.00. Associated arousal with leg movement index was 0.0 .  IMPRESSIONS - Mild obstructive sleep apnea occurred during this study (AHI = 7.7/h). - No significant central sleep apnea occurred during this study (CAI = 0.4/h). - Oxygen desaturation was noted during this study (Min O2 = 84.00%). - The patient snored with Loud snoring volume. - No cardiac abnormalities were noted during this study. - Clinically significant periodic limb movements did not occur during sleep. No significant associated arousals.  DIAGNOSIS - Obstructive Sleep Apnea (327.23 [G47.33 ICD-10])  RECOMMENDATIONS - Very mild obstructive sleep apnea. Return to discuss treatment options. - Avoid alcohol, sedatives and other CNS depressants that may worsen sleep apnea and disrupt normal sleep architecture. - Sleep hygiene should be reviewed to assess factors that may improve sleep quality. - Weight management and regular exercise should be initiated or continued if appropriate.  Armanda Magic Diplomate, American Board of Sleep Medicine  ELECTRONICALLY SIGNED ON:  11/11/2015, 9:23 PM [facility] PH: [phone]   FX: [phone] ACCREDITED BY THE AMERICAN ACADEMY OF SLEEP MEDICINE

## 2015-11-13 NOTE — Progress Notes (Signed)
11/13/15 Tried to give patient sleep study results, however he was working and unable to get results. I will try back after 3 as per patients request.

## 2015-11-13 NOTE — Progress Notes (Signed)
11/13/15  Left message to call back for sleep study results. Called after 3 per patient request, no answer. Will try back Monday.

## 2015-11-19 ENCOUNTER — Telehealth: Payer: Self-pay | Admitting: Cardiology

## 2015-11-19 NOTE — Progress Notes (Signed)
Patient is aware of sleep study results.   He has currently lost around 200 pounds and has 20-30 more pounds to go. He has requested that he be able to hold off on scheduling an appointment until after the first of the year due to a busy holiday traveling schedule.   I let him know to just call our office to reschedule when he is ready to come in to be seen.  Stated verbal understanding and thanked me for the call back.

## 2015-11-19 NOTE — Telephone Encounter (Signed)
Will document in other open encounter.

## 2015-11-19 NOTE — Telephone Encounter (Signed)
Pt calling for sleep study results-pls call, asks if NA to try back

## 2016-01-12 ENCOUNTER — Other Ambulatory Visit: Payer: Self-pay | Admitting: Cardiology

## 2016-03-21 ENCOUNTER — Telehealth: Payer: Self-pay | Admitting: Interventional Cardiology

## 2016-03-21 NOTE — Telephone Encounter (Signed)
Switch to coumadin or see if Xarelto is more afordable. Have an appointment made for coumadin clinic to switch over. Change metoprolol succ to metoprolol tartrate 12.5 mg BID

## 2016-03-21 NOTE — Telephone Encounter (Signed)
New Message:     Pt said he can not afford to get his Eliquis,too expensive.He would like for Dr Katrinka Blazing to recommend something else for him to take.

## 2016-03-21 NOTE — Telephone Encounter (Signed)
Pt called because he said he can't afford the medication Eliquis 5 mg twice a day  and Metoprolol 25 mg once daily. Pt states that the price of these medications have gone up and he can't afford them. Pt would like for Dr. Katrinka Blazing to prescribed something else. Pt gets  these prescriptions from [facility] . This year the price has gone up. A 2 box/14 sample pills of Eliquis placed at the front desk for pt. Pt  is aware.

## 2016-03-22 NOTE — Telephone Encounter (Signed)
Pt is aware that paperwork is being placed at the front desk.  Will route to [place], [name] nurse, to make her aware.

## 2016-03-22 NOTE — Telephone Encounter (Signed)
Spoke with pt and he states that insurance told him Xarelto would only be $10 cheaper.  Spoke with pt about pt assistance program.  Advised I will locate paperwork and place up front for him.  Pt appreciative for assistance.

## 2016-03-22 NOTE — Telephone Encounter (Signed)
Spoke with pt and went over recommendations.  Pt will contact insurance about Xarelto and call me back so we can go from there on anticoag.  Pt also states that his insurance told him that his Metoprolol should be no more than $10.  Pt will call pharmacy to get Metoprolol filled and make sure of price.  He will call back and switch to Tartrate if Succinate is still too expensive.

## 2016-03-22 NOTE — Telephone Encounter (Signed)
Follow up  [employer] would like to change medication Eliquis Tier 3 to warfarin Tier 1.  Please f/u with pt

## 2016-03-25 ENCOUNTER — Telehealth: Payer: Self-pay | Admitting: Interventional Cardiology

## 2016-03-25 NOTE — Telephone Encounter (Signed)
Walk In pt Form-Bristol Tacy Dura dropped off placed in [employer].

## 2016-03-29 ENCOUNTER — Other Ambulatory Visit: Payer: Self-pay

## 2016-03-29 ENCOUNTER — Other Ambulatory Visit: Payer: Self-pay | Admitting: *Deleted

## 2016-03-29 MED ORDER — APIXABAN 5 MG PO TABS: 5.0000 mg | ORAL_TABLET | Freq: Two times a day (BID) | ORAL | 3 refills | Status: DC

## 2016-04-25 ENCOUNTER — Telehealth: Payer: Self-pay | Admitting: Interventional Cardiology

## 2016-04-25 MED ORDER — METOPROLOL SUCCINATE ER 25 MG PO TB24: 25.0000 mg | ORAL_TABLET | Freq: Every day | ORAL | 1 refills | Status: DC

## 2016-04-25 NOTE — Telephone Encounter (Signed)
°*  STAT* If patient is at the pharmacy, call can be transferred to refill team.   1. Which medications need to be refilled? (please list name of each medication and dose if known) metoprolol 25mg   2. Which pharmacy/location (including street and city if local pharmacy) is medication to be sent to?[facility] [place], [place] - [address]  3. Do they need a 30 day or 90 day supply?

## 2016-04-25 NOTE — Telephone Encounter (Signed)
Patient is calling for updates in regards to patient assistance for his Eliquis medication, states that he only has 2 pills left with no refills. Please call to discuss. Thanks.

## 2016-04-26 NOTE — Telephone Encounter (Signed)
Pt became dizzy while at work yesterday.  States he was bending over and when he stood back up, became very dizzy.  Went to medical services at his work today and had them check BP.  They did orthostatics.  Sitting 106/74, 92--standing 100/61, 79--lying 113/66.  Moved pt's appt up to 04/28/16.  Advised pt on parameters of when to report to ER.  Pt verbalized understanding and was in agreement with this plan.

## 2016-04-26 NOTE — Telephone Encounter (Signed)
Follow up      Pt c/o BP issue: STAT if pt c/o blurred vision, one-sided weakness or slurred speech  1. What are your last 5 BP readings? 106/74 sitting HR 92; 100/61 standing HR 79  2. Are you having any other symptoms (ex. Dizziness, headache, blurred vision, passed out)? dizziness 3. What is your BP issue? Pt states that he is extremely dizzy when he bends over.  So dizzy, that he feels like he is going to fall.  I made him an appt for 04-29-16 with the APP but pt has questions regarding working, etc.  Please call

## 2016-04-27 NOTE — Progress Notes (Signed)
Cardiology Office Note    Date:  04/28/2016   ID:  Juan Schwartz, DOB [birth year], MRN [id number]  PCP:  No PCP Per Patient  Cardiologist: Dr. Katrinka Blazing   CC: dizziness   History of Present Illness:  Juan Schwartz is a 51 y.o. male with a history of LV dysfunction, untreated OSA, morbid obesity, DM, HTN, HLD, PAF on Eliquis who presents to clinic for evaluation of dizziness.   He was seen by cardiology in 2004 when he presented with chest pain and SOB. An echo initially showed an EF of 35%. He was cathed by Dr Clarene Duke and had normal coronaries and an EF of 50%. He was over 450 lbs at that time and it scared him so much that he lost weight and started exercising.  He was diagnosed with afib in 07/7015. He was admitted and placed on IV amiodarone and underwent TEE/DCCV on 08/05/15. Echo showed EF was 45% with diffuse hypokinesis. DCCV was successful and pt had maintained SR since. He was subsequently seen by Dr. Katrinka Blazing and amiodarone was discontinued and he was started on Toprol XL 25mg  daily.   He was last seen by Dr. Katrinka Blazing in 10/2015 and doing well. Had a sleep study around that that time and told he only had mild sleep apnea and he didn't need a CPAP. Per review of phone notes on 04/25/16, he called to report dizziness and he was added onto my schedule for further evaluation.  Today he presents to clinic for follow up. He has generally felt unwell recently with more dizziness and fatigue. Just doesn't feel good. No CP or SOB. No LE edema, orthopnea or PND. No syncope. No blood in stool or urine. No palpitations. They sent him home from work due to not feeling well. He drinks almost every night but sometimes has 4-5 beers at a time.     Past Medical History:  Diagnosis Date  . Allergy   . Atrial fibrillation (HCC) 08/04/2015   CHA2DS2-VASc = 3  . CHF (congestive heart failure) (HCC) 2004   one episode  . Diabetes mellitus without complication (HCC)   . Hx of sleep apnea   .  Hyperlipidemia   . Hypertension   . Normal coronary arteries 2004    Past Surgical History:  Procedure Laterality Date  . CARDIAC CATHETERIZATION  2005  . CARDIOVERSION N/A 08/05/2015   Procedure: CARDIOVERSION;  Surgeon: Jake Bathe, MD;  Location: [facility] ENDOSCOPY;  Service: Cardiovascular;  Laterality: N/A;  . HERNIA REPAIR    . TEE WITHOUT CARDIOVERSION N/A 08/05/2015   Procedure: TRANSESOPHAGEAL ECHOCARDIOGRAM (TEE);  Surgeon: Jake Bathe, MD;  Location: [facility] ENDOSCOPY;  Service: Cardiovascular;  Laterality: N/A;    Current Medications: Outpatient Medications Prior to Visit  Medication Sig Dispense Refill  . acetaminophen (TYLENOL) 325 MG tablet Take 2 tablets (650 mg total) by mouth every 4 (four) hours as needed for headache or mild pain.    Marland Kitchen apixaban (ELIQUIS) 5 MG TABS tablet Take 1 tablet (5 mg total) by mouth 2 (two) times daily. 180 tablet 3  . glipiZIDE (GLUCOTROL XL) 10 MG 24 hr tablet Take 1 tablet (10 mg total) by mouth 2 (two) times daily. 60 tablet 3  . ipratropium (ATROVENT) 0.03 % nasal spray Place 2 sprays into the nose 4 (four) times daily. 30 mL 6  . lisinopril-hydrochlorothiazide (PRINZIDE,ZESTORETIC) 20-12.5 MG tablet Take 0.5 tablets by mouth daily.    . metFORMIN (GLUCOPHAGE) 1000 MG tablet Take 1 tablet (  1,000 mg total) by mouth 2 (two) times daily with a meal. 180 tablet 3  . sildenafil (VIAGRA) 100 MG tablet Take 0.5-1 tablets (50-100 mg total) by mouth daily as needed for erectile dysfunction. 5 tablet 11  . atorvastatin (LIPITOR) 20 MG tablet Take 1 tablet (20 mg total) by mouth daily. 90 tablet 3  . metoprolol succinate (TOPROL-XL) 25 MG 24 hr tablet Take 1 tablet (25 mg total) by mouth daily. 90 tablet 1   No facility-administered medications prior to visit.      Allergies:   Patient has no known allergies.   Social History   Social History  . Marital status: Married    Spouse name: N/A  . Number of children: N/A  . Years of education: N/A    Social History Main Topics  . Smoking status: Former Games developer  . Smokeless tobacco: Never Used  . Alcohol use Yes     Comment: occ  . Drug use: No  . Sexual activity: Yes   Other Topics Concern  . None   Social History Narrative  . None     Family History:  The patient's family history includes Diabetes in his mother.     ROS:   Please see the history of present illness.    ROS All other systems reviewed and are negative.   PHYSICAL EXAM:   VS:  BP 115/76   Pulse (!) 115   Ht 5\' 9"  (1.753 m)   Wt 297 lb 12.8 oz (135.1 kg)   BMI 43.98 kg/m    GEN: Well nourished, well developed, in no acute distres, obese HEENT: normal  Neck: no JVD, carotid bruits, or masses Cardiac: irreg, irreg, tachy; no murmurs, rubs, or gallops,no edema  Respiratory:  clear to auscultation bilaterally, normal work of breathing GI: soft, nontender, nondistended, + BS MS: no deformity or atrophy  Skin: warm and dry, no rash Neuro:  Alert and Oriented x 3, Strength and sensation are intact Psych: euthymic mood, full affect     Wt Readings from Last 3 Encounters:  04/28/16 297 lb 12.8 oz (135.1 kg)  11/08/15 271 lb (122.9 kg)  10/27/15 298 lb 12.8 oz (135.5 kg)      Studies/Labs Reviewed:   EKG:  EKG is ordered today.  The ekg ordered today demonstrates afib with RVR HR 115  Recent Labs: 08/04/2015: BUN 24; Creatinine, Ser 0.97; Hemoglobin 15.6; Magnesium 2.1; Platelets 194; Potassium 4.0; Sodium 131; TSH 2.369   Lipid Panel    Component Value Date/Time   CHOL 182 08/25/2012 0818   TRIG 193 (H) 08/25/2012 0818   HDL 48 08/25/2012 0818   CHOLHDL 3.8 08/25/2012 0818   VLDL 39 08/25/2012 0818   LDLCALC 95 08/25/2012 0818    Additional studies/ records that were reviewed today include:  2D ECHO: 08/05/2015 LV EF: 45% -   50% Study Conclusions - Left ventricle: The cavity size was normal. There was moderate   concentric hypertrophy. Systolic function was mildly reduced. The    estimated ejection fraction was in the range of 45% to 50%.   Diffuse hypokinesis. Features are consistent with a pseudonormal   left ventricular filling pattern, with concomitant abnormal   relaxation and increased filling pressure (grade 2 diastolic   dysfunction). Doppler parameters are consistent with elevated   ventricular end-diastolic filling pressure. - Aortic root: The aortic root was normal in size. - Mitral valve: Structurally normal valve. - Left atrium: The atrium was mildly dilated. - Right ventricle:  The cavity size was normal. Wall thickness was   normal. Systolic function was normal. - Right atrium: The atrium was normal in size. - Tricuspid valve: There was trivial regurgitation. - Pulmonic valve: There was no regurgitation. - Pulmonary arteries: Systolic pressure was within the normal   range. - Inferior vena cava: The vessel was normal in size. - Pericardium, extracardiac: There was no pericardial effusion.   ASSESSMENT & PLAN:   Afib with RVR: he stayed in sinus for about 9 months after DCCV on amiodarone. Amio was eventually discontinued. Now back in afib with RVR HR 115 and symptomatic with dizziness and general malaise. He has not been taking eliquis twice daily as directed. Discussed the importance of taking it BID. He now has copay cards that will cover it at $10 a month. For now, I will increase Toprol XL from 25mg  to 50mg  daily and have him seen in the afib clinic. He may need repeat DCCV and antiarrythmic therapy. He is quite young, so amio may not be best option. He has structural heart disease (EF 45% but as low as 30% in the past) so may be a good Tikosyn candidate? Will defer to [name] NP. Discussed the importance of minimal alcohol and caffeine intake as well as weight loss. CHADSVASC of at least (CHF, HTN)  OSA: mild by sleep study in 10/2015. He was under the impression that he didn't have to get a CPAP. I discussed case with Dr. Mayford Knife who felt that  since he has had recurrence of his afib and had mild sleep apnea with desaturations, that a CPAP is indicated. I will have a CPAP titration arranged. Follow up with Dr. Mayford Knife   HTN: BP well controlled   DMT2: per PCP  Morbid obesity: Body mass index is 43.98 kg/m. He has been as large as 475 lbs. Now down to 275 lbs. I encouraged him to get back exercising.   Medication Adjustments/Labs and Tests Ordered: Current medicines are reviewed at length with the patient today.  Concerns regarding medicines are outlined above.  Medication changes, Labs and Tests ordered today are listed in the Patient Instructions below. Patient Instructions  Medication Instructions:  Your physician has recommended you make the following change in your medication:  1.  PLEASE TAKE YOUR ELIQUIS TWICE A DAY 2.  INCREASE the Metoprolol to 50 mg taking 1 tablet TWICE A DAY  Labwork: None ordered  Testing/Procedures: None ordered  Follow-Up: Your physician recommends that you schedule a follow-up appointment in: 1 WEEK IN THE AFIB CLINIC WITH DONNA CARROL.   Any Other Special Instructions Will Be Listed Below (If Applicable).     If you need a refill on your cardiac medications before your next appointment, please call your pharmacy.      Signed, Cline Crock, PA-C  04/28/2016 4:01 PM    [facility] [address] [place], [place], [place]  [address] Phone: [phone]; Fax: [phone]

## 2016-04-28 ENCOUNTER — Ambulatory Visit (INDEPENDENT_AMBULATORY_CARE_PROVIDER_SITE_OTHER): Payer: Commercial Managed Care - HMO | Admitting: Physician Assistant

## 2016-04-28 ENCOUNTER — Encounter (INDEPENDENT_AMBULATORY_CARE_PROVIDER_SITE_OTHER): Payer: Self-pay

## 2016-04-28 ENCOUNTER — Encounter: Payer: Self-pay | Admitting: *Deleted

## 2016-04-28 ENCOUNTER — Encounter: Payer: Self-pay | Admitting: Physician Assistant

## 2016-04-28 VITALS — BP 115/76 | HR 115 | Ht 69.0 in | Wt 297.8 lb

## 2016-04-28 DIAGNOSIS — Z7901 Long term (current) use of anticoagulants: Secondary | ICD-10-CM

## 2016-04-28 DIAGNOSIS — E1122 Type 2 diabetes mellitus with diabetic chronic kidney disease: Secondary | ICD-10-CM

## 2016-04-28 DIAGNOSIS — I4891 Unspecified atrial fibrillation: Secondary | ICD-10-CM

## 2016-04-28 DIAGNOSIS — E785 Hyperlipidemia, unspecified: Secondary | ICD-10-CM

## 2016-04-28 DIAGNOSIS — N183 Chronic kidney disease, stage 3 (moderate): Secondary | ICD-10-CM | POA: Diagnosis not present

## 2016-04-28 DIAGNOSIS — I10 Essential (primary) hypertension: Secondary | ICD-10-CM | POA: Diagnosis not present

## 2016-04-28 MED ORDER — METOPROLOL SUCCINATE ER 50 MG PO TB24: 50.0000 mg | ORAL_TABLET | Freq: Every day | ORAL | 1 refills | Status: DC

## 2016-04-28 NOTE — Patient Instructions (Addendum)
Medication Instructions:  Your physician has recommended you make the following change in your medication:  1.  PLEASE TAKE YOUR ELIQUIS TWICE A DAY 2.  INCREASE the Metoprolol to 50 mg taking 1 tablet TWICE A DAY  Labwork: None ordered  Testing/Procedures: None ordered  Follow-Up: Your physician recommends that you schedule a follow-up appointment in: 1 WEEK IN THE AFIB CLINIC WITH DONNA CARROL.   Any Other Special Instructions Will Be Listed Below (If Applicable).     If you need a refill on your cardiac medications before your next appointment, please call your pharmacy.

## 2016-04-29 ENCOUNTER — Ambulatory Visit: Payer: Commercial Managed Care - HMO | Admitting: Physician Assistant

## 2016-05-02 ENCOUNTER — Telehealth: Payer: Self-pay | Admitting: *Deleted

## 2016-05-02 ENCOUNTER — Encounter: Payer: Self-pay | Admitting: *Deleted

## 2016-05-02 DIAGNOSIS — G4733 Obstructive sleep apnea (adult) (pediatric): Secondary | ICD-10-CM

## 2016-05-02 NOTE — Telephone Encounter (Signed)
-----   Message from Elliot Cousin, [place] sent at 04/28/2016  2:53 PM EDT ----- THIS PT NEEDS TO BE SET UP FOR A CPAP TITRATION, PER KATIE THOMPSON.

## 2016-05-04 NOTE — Telephone Encounter (Signed)
I have called [facility], the pts pharmacy, and requested that they fax [place] a PA form for the pts Eliquis.

## 2016-05-04 NOTE — Telephone Encounter (Signed)
The only insurance info I have on the pt is old. I called the pt to get updated insurance info from him and he advised me that the PA for his Eliquis was approved about a month ago and that he has picked up a refill already.

## 2016-05-05 ENCOUNTER — Ambulatory Visit (HOSPITAL_COMMUNITY)
Admission: RE | Admit: 2016-05-05 | Discharge: 2016-05-05 | Disposition: A | Discharge status: Home / Self Care | Payer: Commercial Managed Care - HMO | Source: Ambulatory Visit | Attending: Nurse Practitioner | Admitting: Nurse Practitioner

## 2016-05-05 ENCOUNTER — Encounter (HOSPITAL_COMMUNITY): Payer: Self-pay | Admitting: Nurse Practitioner

## 2016-05-05 VITALS — BP 110/64 | HR 69 | Ht 69.0 in | Wt 303.0 lb

## 2016-05-05 DIAGNOSIS — Z833 Family history of diabetes mellitus: Secondary | ICD-10-CM | POA: Diagnosis not present

## 2016-05-05 DIAGNOSIS — I480 Paroxysmal atrial fibrillation: Secondary | ICD-10-CM

## 2016-05-05 DIAGNOSIS — E785 Hyperlipidemia, unspecified: Secondary | ICD-10-CM | POA: Diagnosis not present

## 2016-05-05 DIAGNOSIS — Z7984 Long term (current) use of oral hypoglycemic drugs: Secondary | ICD-10-CM | POA: Diagnosis not present

## 2016-05-05 DIAGNOSIS — Z79899 Other long term (current) drug therapy: Secondary | ICD-10-CM | POA: Diagnosis not present

## 2016-05-05 DIAGNOSIS — E119 Type 2 diabetes mellitus without complications: Secondary | ICD-10-CM | POA: Insufficient documentation

## 2016-05-05 DIAGNOSIS — I10 Essential (primary) hypertension: Secondary | ICD-10-CM | POA: Insufficient documentation

## 2016-05-05 DIAGNOSIS — Z87891 Personal history of nicotine dependence: Secondary | ICD-10-CM | POA: Diagnosis not present

## 2016-05-05 DIAGNOSIS — G4730 Sleep apnea, unspecified: Secondary | ICD-10-CM | POA: Insufficient documentation

## 2016-05-05 DIAGNOSIS — Z9119 Patient's noncompliance with other medical treatment and regimen: Secondary | ICD-10-CM | POA: Diagnosis not present

## 2016-05-05 DIAGNOSIS — Z7901 Long term (current) use of anticoagulants: Secondary | ICD-10-CM | POA: Diagnosis not present

## 2016-05-05 DIAGNOSIS — I4891 Unspecified atrial fibrillation: Secondary | ICD-10-CM

## 2016-05-06 NOTE — Progress Notes (Signed)
Patient ID: DARRYN FUGUA, male   DOB: 1965/03/15, 51 y.o.   MRN: [id number]     Primary Care Physician: No PCP Per Patient Referring Physician: [facility] F/U Cardiologist: Dr. Lionel December Juan Schwartz is a 51 y.o. AA male with a h/o obesity, HTN, untreated sleep apnea, daily ETOH ("2 beers a day"), HLD, and NIDDM. He was seen by cardiology in 2004 when he presented with chest pain and SOB. An echo initially showed an EF of 35%. He was cathed by Dr Clarene Duke and had normal coronaries and an EF of 50%. It was felt he had an episode of CHF then but he has not had any further episodes.   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 He began to get weak and decided to stop for the day. The morning of admit 08/04/15 he went to work (works outside) and as soon as it started to warm up he again felt weak. He denied any chest pain or dyspnea. He denied any palpations. In the ED his EKG shows AF with RVR @ 126, poor ant RW. His B/P is a little low and his BUN was 24 suggesting he is mildly dehydrated. He was admitted and hydrated, IV heparin and dilt. It was noted he had sleep apnea and will need out pt sleep study.   His cardiac enzymes were negative. His TSH was normal. K+and Mg+ normal. He was eventually place on IV amiodarone and underwent TEE and DCCV on 08/05/15. EF was 45% with diffuse hypokinesis Tr MR. DCCV was successful and pt has maintained SR since.   He was seen by Dr. Katrinka Blazing and amiodarone was stopped. Metoprolol was added. His glucose off Metformin was elevated during hospitaltiztion. We have added Eliquis for 4 week and no asprin for that time. Plan for out pt sleep study. He will follow up in office in 1-3 weeks with Juan Schwartz in a fib clinic then back to Dr. Katrinka Blazing.  F/u in afib clinic, 7/20. Pt states that he feels well. No return of afib. He returns to work next Tuesday and he does work outside in the heat. BP soft today at 98/64. He states that BP has  been lower since metoprolol was added. He is not symptomatic with this currently. He does have a sleep study pending. He is being compliant with eliquis and with a a chadsvasc score of at least 2(HYN, DM), it is felt that he should stay on anticoagulant.He has lost 150 lbs over the last couple of years, at one point weighting 468 lbs. He has plateaued with his weight  and plans to return to the gym and restart his walking 1-2 miles 2x a day. We discussed alcohol and afib connection and he has quit alcohol since hospitalization and plans  not drink alcohol going forward.   F/u in afib clinic 4/12.He recently was seen by [name], [name] for f/u and reported that he had not felt well recentlyy with more dizziness, fatigue.He was found to be in afib. It was recommended that he undergo a cpap titration trial due to return of afib but sleep study showed only mild sleep apnea last year. Pt states today that he doe not want to pursue this. He states that it is all his fault that he went into afib as he had been non compliant with his meds. He was only taking BB occassionally and would often skip his anticoagulant.  He has increased his alcohol intake.He said before  he did well with he followed the plan without afib.   Today, he denies symptoms of palpitations, chest pain, shortness of breath, orthopnea, PND, lower extremity edema, dizziness, presyncope, syncope, or neurologic sequela. The patient is tolerating medications without difficulties and is otherwise without complaint today.   Past Medical History:  Diagnosis Date  . Allergy   . Atrial fibrillation (HCC) 08/04/2015   CHA2DS2-VASc = 3  . CHF (congestive heart failure) (HCC) 2004   one episode  . Diabetes mellitus without complication (HCC)   . Hx of sleep apnea   . Hyperlipidemia   . Hypertension   . Normal coronary arteries 2004   Past Surgical History:  Procedure Laterality Date  . CARDIAC CATHETERIZATION  2005  . CARDIOVERSION N/A  08/05/2015   Procedure: CARDIOVERSION;  Surgeon: Jake Bathe, MD;  Location: [facility] ENDOSCOPY;  Service: Cardiovascular;  Laterality: N/A;  . HERNIA REPAIR    . TEE WITHOUT CARDIOVERSION N/A 08/05/2015   Procedure: TRANSESOPHAGEAL ECHOCARDIOGRAM (TEE);  Surgeon: Jake Bathe, MD;  Location: [facility] ENDOSCOPY;  Service: Cardiovascular;  Laterality: N/A;    Current Outpatient Prescriptions  Medication Sig Dispense Refill  . acetaminophen (TYLENOL) 325 MG tablet Take 2 tablets (650 mg total) by mouth every 4 (four) hours as needed for headache or mild pain.    Marland Kitchen apixaban (ELIQUIS) 5 MG TABS tablet Take 1 tablet (5 mg total) by mouth 2 (two) times daily. 180 tablet 3  . glipiZIDE (GLUCOTROL XL) 10 MG 24 hr tablet Take 1 tablet (10 mg total) by mouth 2 (two) times daily. 60 tablet 3  . ipratropium (ATROVENT) 0.03 % nasal spray Place 2 sprays into the nose 4 (four) times daily. 30 mL 6  . lisinopril-hydrochlorothiazide (PRINZIDE,ZESTORETIC) 20-12.5 MG tablet Take 0.5 tablets by mouth daily.    . metFORMIN (GLUCOPHAGE) 1000 MG tablet Take 1 tablet (1,000 mg total) by mouth 2 (two) times daily with a meal. 180 tablet 3  . metoprolol succinate (TOPROL-XL) 50 MG 24 hr tablet Take 1 tablet (50 mg total) by mouth daily. (Patient taking differently: Take 50 mg by mouth 2 (two) times daily. ) 60 tablet 1  . sildenafil (VIAGRA) 100 MG tablet Take 0.5-1 tablets (50-100 mg total) by mouth daily as needed for erectile dysfunction. 5 tablet 11   No current facility-administered medications for this encounter.     No Known Allergies  Social History   Social History  . Marital status: Married    Spouse name: N/A  . Number of children: N/A  . Years of education: N/A   Occupational History  . Not on file.   Social History Main Topics  . Smoking status: Former Games developer  . Smokeless tobacco: Never Used  . Alcohol use Yes     Comment: occ  . Drug use: No  . Sexual activity: Yes   Other Topics Concern  . Not  on file   Social History Narrative  . No narrative on file    Family History  Problem Relation Age of Onset  . Diabetes Mother     ROS- All systems are reviewed and negative except as per the HPI above  Physical Exam: Vitals:   05/05/16 1400  BP: 110/64  Pulse: 69  Weight: (!) 303 lb (137.4 kg)  Height: 5\' 9"  (1.753 m)    GEN- The patient is well appearing, alert and oriented x 3 today.   Head- normocephalic, atraumatic Eyes-  Sclera clear, conjunctiva pink Ears- hearing intact Oropharynx- clear  Neck- supple, no JVP Lymph- no cervical lymphadenopathy Lungs- Clear to ausculation bilaterally, normal work of breathing Heart- Regular rate and rhythm, no murmurs, rubs or gallops, PMI not laterally displaced GI- soft, NT, ND, + BS Extremities- no clubbing, cyanosis, or edema MS- no significant deformity or atrophy Skin- no rash or lesion Psych- euthymic mood, full affect Neuro- strength and sensation are intact  EKG-NSR, 67 bpm, Pr int 178 bpm, qrs int 88 ms, qtc 418 ms Epic records reviewed  Assessment and Plan: 1. NSR Recent return of afib 2/2 noncompliance  Re educated pt re afib guidelines He states that he will comply with plan Continue with metoprolol Continue with apixaban bid long term with chdsvasc score of two(htn, DM)  2. Risk factor modification Weight loss encouraged Pt does not want to pursue cpap titration at this point Avoid alcohol  f/u with Dr. Katrinka Blazing in 6 months afib clinic as needed   Lupita Leash C. [facility] [address] [place], [place] [address] [phone]

## 2016-05-09 ENCOUNTER — Encounter: Payer: Self-pay | Admitting: *Deleted

## 2016-05-09 ENCOUNTER — Telehealth: Payer: Self-pay | Admitting: *Deleted

## 2016-05-09 NOTE — Telephone Encounter (Signed)
Called the patient to inform him of his upcoming cpap titration and he has declined his sleep study at this time.  He understands that this is his providers recommendation for his health. He understands his cpap titration will be cancelled. A Fib clinic notified Misty Stanley) In lab test cancelled.

## 2016-06-24 ENCOUNTER — Encounter (HOSPITAL_BASED_OUTPATIENT_CLINIC_OR_DEPARTMENT_OTHER): Payer: Commercial Managed Care - HMO

## 2016-09-19 ENCOUNTER — Telehealth: Payer: Self-pay | Admitting: Interventional Cardiology

## 2016-09-19 MED ORDER — APIXABAN 5 MG PO TABS: 5.0000 mg | ORAL_TABLET | Freq: Two times a day (BID) | ORAL | 2 refills | Status: DC

## 2016-09-19 MED ORDER — METOPROLOL SUCCINATE ER 50 MG PO TB24: 50.0000 mg | ORAL_TABLET | Freq: Two times a day (BID) | ORAL | 2 refills | Status: DC

## 2016-09-19 NOTE — Telephone Encounter (Signed)
Called patient back to verify dosage of Metoprolol Succ.  Patient states he was once taking 25mg  daily and then was increased to 50mg  daily. He was having dizzy spells and was told to decrease back to 25mg  daily.  I do not see were he was told to decrease back to 25mg .  Last OV with AFIB Clinic it was document 50mg  twice daily.  He says he has been taking 25mg  once daily and still has had dizziness recently and was sent home today from work.  He takes his Eliquis most days once daily instead of twice.  He took it twice today and is resting at home with some dizziness.  He wants a sooner appointment than October, he was transferred to Scheduling.

## 2016-09-19 NOTE — Telephone Encounter (Signed)
New message     *STAT* If patient is at the pharmacy, call can be transferred to refill team.   1. Which medications need to be refilled? (please list name of each medication and dose if known) metoprolol 50 mg, Eliquis 5 mg  2. Which pharmacy/location (including street and city if local pharmacy) is medication to be sent to? [facility] on [address]   3. Do they need a 30 day or 90 day supply? 30 day

## 2016-09-19 NOTE — Telephone Encounter (Signed)
New message   Pt states refill was sent in wrong. Was supposed oto be 25MG  tablets not 50MG  tablets. He wants it sent in correctly.    *STAT* If patient is at the pharmacy, call can be transferred to refill team.   1. Which medications need to be refilled? (please list name of each medication and dose if known) metoprolol succinate (TOPROL-XL) 50 MG 24 hr tablet  2. Which pharmacy/location (including street and city if local pharmacy) is medication to be sent to? [facility] on [address]  3. Do they need a 30 day or 90 day supply? 30 day supply

## 2016-09-21 NOTE — Telephone Encounter (Signed)
Thanks for update

## 2016-09-22 MED ORDER — METOPROLOL SUCCINATE ER 25 MG PO TB24: 25.0000 mg | ORAL_TABLET | Freq: Two times a day (BID) | ORAL | 3 refills | Status: DC

## 2016-09-22 NOTE — Telephone Encounter (Signed)
Okay to take Metop Succ 25 mg BID

## 2016-09-22 NOTE — Telephone Encounter (Signed)
Spoke with pt and made him aware of Dr. Michaelle Copas response.  Sent medication to preferred pharmacy.  Pt verbalized understanding and was appreciative for call.

## 2016-09-22 NOTE — Telephone Encounter (Signed)
Spoke with pt about the importance of taking Eliquis twice a day.  Pt states he was forgetting to take his evening medications because he works three jobs.  He has now obtained a pill box and keeps his medications with him at all times so has been compliant with medication regimen.  Pt was prescribed Metoprolol Succinate 50mg  BID back in April but never started this dose.  He said pharmacy has been filling 25mg .  Pt was trying to make pills last longer by only taking QD but said he felt bad so he started taking them BID as prescribed and feels much better.  States now it's "like nothing ever happened", all sx went away.  Advised pt I would send message to Dr. Katrinka Blazing to see if ok for pt to stay on Metoprolol Succinate 25mg  BID since he never increased the dose? Pt appreciative for call.   Pt also mentioned that he was on Metorpolol Succinate 50mg  QD in the past and was very weak, fatigued and dizzy on this dose.

## 2016-10-31 ENCOUNTER — Encounter: Payer: Self-pay | Admitting: Interventional Cardiology

## 2016-11-11 ENCOUNTER — Encounter: Payer: Self-pay | Admitting: Interventional Cardiology

## 2016-11-11 ENCOUNTER — Encounter: Payer: Self-pay | Admitting: *Deleted

## 2016-11-11 ENCOUNTER — Ambulatory Visit (INDEPENDENT_AMBULATORY_CARE_PROVIDER_SITE_OTHER): Payer: 59 | Admitting: Interventional Cardiology

## 2016-11-11 VITALS — BP 136/84 | HR 81 | Ht 69.0 in | Wt 294.0 lb

## 2016-11-11 DIAGNOSIS — E1122 Type 2 diabetes mellitus with diabetic chronic kidney disease: Secondary | ICD-10-CM | POA: Diagnosis not present

## 2016-11-11 DIAGNOSIS — I480 Paroxysmal atrial fibrillation: Secondary | ICD-10-CM | POA: Diagnosis not present

## 2016-11-11 DIAGNOSIS — E785 Hyperlipidemia, unspecified: Secondary | ICD-10-CM | POA: Diagnosis not present

## 2016-11-11 DIAGNOSIS — I10 Essential (primary) hypertension: Secondary | ICD-10-CM

## 2016-11-11 DIAGNOSIS — N183 Chronic kidney disease, stage 3 (moderate): Secondary | ICD-10-CM | POA: Diagnosis not present

## 2016-11-11 MED ORDER — LISINOPRIL-HYDROCHLOROTHIAZIDE 20-12.5 MG PO TABS: 0.5000 | ORAL_TABLET | Freq: Every day | ORAL | 3 refills | Status: DC

## 2016-11-11 MED ORDER — METOPROLOL SUCCINATE ER 25 MG PO TB24: 25.0000 mg | ORAL_TABLET | Freq: Two times a day (BID) | ORAL | 3 refills | Status: DC

## 2016-11-11 MED ORDER — APIXABAN 5 MG PO TABS: 5.0000 mg | ORAL_TABLET | Freq: Two times a day (BID) | ORAL | 3 refills | Status: DC

## 2016-11-11 NOTE — Progress Notes (Signed)
Cardiology Office Note    Date:  11/11/2016   ID:  Juan Schwartz, DOB 1965/06/14, MRN [id number]  PCP:  Patient, No Pcp Per  Cardiologist: Lesleigh Noe, MD   Chief Complaint  Patient presents with  . Atrial Fibrillation    History of Present Illness:  Juan Schwartz is a 51 y.o. male with history of paroxysmal atrial fibrillation, electrical cardioversion in July 2017, anticoagulation started that time, mild reduction in LV function with mildly depressed LVEF in the 45% range, and no recurrence of palpitations.  Overall he is doing well. He mostly takes metoprolol succinate 25 mg daily. If he has palpitations don't.. We will continue this pattern. He has had no bleeding complications on Eliquis. He denies chest pain. No orthopnea PND.    Past Medical History:  Diagnosis Date  . Allergy   . Atrial fibrillation (HCC) 08/04/2015   CHA2DS2-VASc = 3  . CHF (congestive heart failure) (HCC) 2004   one episode  . Diabetes mellitus without complication (HCC)   . Hx of sleep apnea   . Hyperlipidemia   . Hypertension   . Normal coronary arteries 2004    Past Surgical History:  Procedure Laterality Date  . CARDIAC CATHETERIZATION  2005  . CARDIOVERSION N/A 08/05/2015   Procedure: CARDIOVERSION;  Surgeon: Jake Bathe, MD;  Location: [facility] ENDOSCOPY;  Service: Cardiovascular;  Laterality: N/A;  . HERNIA REPAIR    . TEE WITHOUT CARDIOVERSION N/A 08/05/2015   Procedure: TRANSESOPHAGEAL ECHOCARDIOGRAM (TEE);  Surgeon: Jake Bathe, MD;  Location: [facility] ENDOSCOPY;  Service: Cardiovascular;  Laterality: N/A;    Current Medications: Outpatient Medications Prior to Visit  Medication Sig Dispense Refill  . acetaminophen (TYLENOL) 325 MG tablet Take 2 tablets (650 mg total) by mouth every 4 (four) hours as needed for headache or mild pain.    Marland Kitchen glipiZIDE (GLUCOTROL XL) 10 MG 24 hr tablet Take 1 tablet (10 mg total) by mouth 2 (two) times daily. 60 tablet 3  . ipratropium  (ATROVENT) 0.03 % nasal spray Place 2 sprays into the nose 4 (four) times daily. 30 mL 6  . metFORMIN (GLUCOPHAGE) 1000 MG tablet Take 1 tablet (1,000 mg total) by mouth 2 (two) times daily with a meal. 180 tablet 3  . sildenafil (VIAGRA) 100 MG tablet Take 0.5-1 tablets (50-100 mg total) by mouth daily as needed for erectile dysfunction. 5 tablet 11  . apixaban (ELIQUIS) 5 MG TABS tablet Take 1 tablet (5 mg total) by mouth 2 (two) times daily. 60 tablet 2  . lisinopril-hydrochlorothiazide (PRINZIDE,ZESTORETIC) 20-12.5 MG tablet Take 0.5 tablets by mouth daily.    . metoprolol succinate (TOPROL-XL) 25 MG 24 hr tablet Take 1 tablet (25 mg total) by mouth 2 (two) times daily. 180 tablet 3   No facility-administered medications prior to visit.      Allergies:   Patient has no known allergies.   Social History   Social History  . Marital status: Married    Spouse name: N/A  . Number of children: N/A  . Years of education: N/A   Social History Main Topics  . Smoking status: Former Games developer  . Smokeless tobacco: Never Used  . Alcohol use Yes     Comment: occ  . Drug use: No  . Sexual activity: Yes   Other Topics Concern  . None   Social History Narrative  . None     Family History:  The patient's family history includes Diabetes in his  mother.   ROS:   Please see the history of present illness.    Snoring, otherwise no complaints but tactile dysfunction.  All other systems reviewed and are negative.   PHYSICAL EXAM:   VS:  BP 136/84 (BP Location: Left Arm)   Pulse 81   Ht 5\' 9"  (1.753 m)   Wt 294 lb (133.4 kg)   BMI 43.42 kg/m    GEN: Well nourished, well developed, in no acute distress . Morbid obesity HEENT: normal  Neck: no JVD, carotid bruits, or masses Cardiac: RRR; no murmurs, rubs, or gallops,no edema  Respiratory:  clear to auscultation bilaterally, normal work of breathing GI: soft, nontender, nondistended, + BS MS: no deformity or atrophy  Skin: warm and  dry, no rash Neuro:  Alert and Oriented x 3, Strength and sensation are intact Psych: euthymic mood, full affect  Wt Readings from Last 3 Encounters:  11/11/16 294 lb (133.4 kg)  05/05/16 (!) 303 lb (137.4 kg)  04/28/16 297 lb 12.8 oz (135.1 kg)      Studies/Labs Reviewed:   EKG:  EKG  Not repeated  Recent Labs: No results found for requested labs within last 8760 hours.   Lipid Panel    Component Value Date/Time   CHOL 182 08/25/2012 0818   TRIG 193 (H) 08/25/2012 0818   HDL 48 08/25/2012 0818   CHOLHDL 3.8 08/25/2012 0818   VLDL 39 08/25/2012 0818   LDLCALC 95 08/25/2012 0818    Additional studies/ records that were reviewed today include:   2-D Doppler echocardiogram performed July 2017: Study Conclusions   - Left ventricle: The cavity size was normal. There was moderate   concentric hypertrophy. Systolic function was mildly reduced. The   estimated ejection fraction was in the range of 45% to 50%.   Diffuse hypokinesis. Features are consistent with a pseudonormal   left ventricular filling pattern, with concomitant abnormal   relaxation and increased filling pressure (grade 2 diastolic   dysfunction). Doppler parameters are consistent with elevated   ventricular end-diastolic filling pressure. - Aortic root: The aortic root was normal in size. - Mitral valve: Structurally normal valve. - Left atrium: The atrium was mildly dilated. - Right ventricle: The cavity size was normal. Wall thickness was   normal. Systolic function was normal. - Right atrium: The atrium was normal in size. - Tricuspid valve: There was trivial regurgitation. - Pulmonic valve: There was no regurgitation. - Pulmonary arteries: Systolic pressure was within the normal   range. - Inferior vena cava: The vessel was normal in size. - Pericardium, extracardiac: There was no pericardial effusion.      ASSESSMENT:    1. Paroxysmal atrial fibrillation (HCC)   2. Essential hypertension     3. Hyperlipidemia with target low density lipoprotein (LDL) cholesterol less than 70 mg/dL   4. Type 2 diabetes mellitus with stage 3 chronic kidney disease, unspecified whether long term insulin use (HCC)      PLAN:  In order of problems listed above:  1. Currently controlled on his current medical regimen and protected from embolic stroke by Apixaban. 2. Blood pressures well controlled. Target should be 130/85 mmHg a less. He should take metoprolol succinate 25-50 mg daily. There are times when he has a full 50 mg a feels lightheaded and dizzy. We will play this by ear. 3. Has hyperlipidemia. He is diabetic. LDL target should be less than 70. This is being managed by primary care.  Clinical follow-up in one year. Call  if recurrent atrial fibrillation which causes him to be quite symptomatic.  Medication Adjustments/Labs and Tests Ordered: Current medicines are reviewed at length with the patient today.  Concerns regarding medicines are outlined above.  Medication changes, Labs and Tests ordered today are listed in the Patient Instructions below. Patient Instructions  Medication Instructions:  Your physician recommends that you continue on your current medications as directed. Please refer to the Current Medication list given to you today.   Labwork: Please take prescription provided to Urgent Care to have labs drawn along with others.  Testing/Procedures: None   Follow-Up: Your physician wants you to follow-up in 1 year with Dr. Katrinka BlazingSmith. You will receive a reminder letter in the mail two months in advance. If you don't receive a letter, please call our office to schedule the follow-up appointment.   Any Other Special Instructions Will Be Listed Below (If Applicable).     If you need a refill on your cardiac medications before your next appointment, please call your pharmacy.     Signed, Lesleigh NoeHenry W Yobana Culliton III, MD  11/11/2016 4:37 PM    [facility] [address], [place], [place]  [address] Phone: [phone]; Fax: [phone]

## 2016-11-11 NOTE — Patient Instructions (Addendum)
Medication Instructions:  Your physician recommends that you continue on your current medications as directed. Please refer to the Current Medication list given to you today.   Labwork: Please take prescription provided to Urgent Care to have labs drawn along with others.  Testing/Procedures: None   Follow-Up: Your physician wants you to follow-up in 1 year with Dr. Katrinka Blazing. You will receive a reminder letter in the mail two months in advance. If you don't receive a letter, please call our office to schedule the follow-up appointment.   Any Other Special Instructions Will Be Listed Below (If Applicable).     If you need a refill on your cardiac medications before your next appointment, please call your pharmacy.

## 2016-12-23 ENCOUNTER — Other Ambulatory Visit: Payer: Self-pay | Admitting: Interventional Cardiology

## 2017-07-17 ENCOUNTER — Ambulatory Visit: Payer: 59 | Admitting: Interventional Cardiology

## 2017-07-17 ENCOUNTER — Encounter: Payer: Self-pay | Admitting: *Deleted

## 2017-07-17 ENCOUNTER — Encounter: Payer: Self-pay | Admitting: Interventional Cardiology

## 2017-07-17 ENCOUNTER — Telehealth: Payer: Self-pay | Admitting: Interventional Cardiology

## 2017-07-17 VITALS — BP 100/68 | HR 76 | Ht 69.0 in | Wt 290.0 lb

## 2017-07-17 DIAGNOSIS — I10 Essential (primary) hypertension: Secondary | ICD-10-CM | POA: Diagnosis not present

## 2017-07-17 DIAGNOSIS — E1122 Type 2 diabetes mellitus with diabetic chronic kidney disease: Secondary | ICD-10-CM

## 2017-07-17 DIAGNOSIS — I4891 Unspecified atrial fibrillation: Secondary | ICD-10-CM

## 2017-07-17 DIAGNOSIS — Z7901 Long term (current) use of anticoagulants: Secondary | ICD-10-CM

## 2017-07-17 DIAGNOSIS — E785 Hyperlipidemia, unspecified: Secondary | ICD-10-CM | POA: Diagnosis not present

## 2017-07-17 DIAGNOSIS — N183 Chronic kidney disease, stage 3 (moderate): Secondary | ICD-10-CM

## 2017-07-17 LAB — CBC
Hematocrit: 43.8 % (ref 37.5–51.0)
Hemoglobin: 15 g/dL (ref 13.0–17.7)
MCH: 30.9 pg (ref 26.6–33.0)
MCHC: 34.2 g/dL (ref 31.5–35.7)
MCV: 90 fL (ref 79–97)
Platelets: 200 10*3/uL (ref 150–450)
RBC: 4.86 x10E6/uL (ref 4.14–5.80)
RDW: 14.5 % (ref 12.3–15.4)
WBC: 9.8 10*3/uL (ref 3.4–10.8)

## 2017-07-17 LAB — BASIC METABOLIC PANEL
BUN / CREAT RATIO: 23 — AB (ref 9–20)
BUN: 19 mg/dL (ref 6–24)
CHLORIDE: 98 mmol/L (ref 96–106)
CO2: 22 mmol/L (ref 20–29)
Calcium: 10.4 mg/dL — ABNORMAL HIGH (ref 8.7–10.2)
Creatinine, Ser: 0.82 mg/dL (ref 0.76–1.27)
GFR calc Af Amer: 119 mL/min/{1.73_m2} (ref >59)
GFR calc non Af Amer: 103 mL/min/{1.73_m2} (ref >59)
GLUCOSE: 298 mg/dL — AB (ref 65–99)
POTASSIUM: 5 mmol/L (ref 3.5–5.2)
SODIUM: 136 mmol/L (ref 134–144)

## 2017-07-17 LAB — PROTIME-INR
INR: 1.1 (ref 0.8–1.2)
PROTHROMBIN TIME: 11 s (ref 9.1–12.0)

## 2017-07-17 NOTE — Patient Instructions (Signed)
Medication Instructions:  1) STOP Lisinopril/HCTZ 2) START Metoprolol Succinate 25mg  twice daily  Labwork: BMET, CBC and INR today  Testing/Procedures: Your physician has recommended that you have a Cardioversion (DCCV). Electrical Cardioversion uses a jolt of electricity to your heart either through paddles or wired patches attached to your chest. This is a controlled, usually prescheduled, procedure. Defibrillation is done under light anesthesia in the hospital, and you usually go home the day of the procedure. This is done to get your heart back into a normal rhythm. You are not awake for the procedure. Please see the instruction sheet given to you today.   Follow-Up: Your physician recommends that you schedule a follow-up appointment in: 1-2 weeks after Cardioversion with an APP and an EKG.   Any Other Special Instructions Will Be Listed Below (If Applicable).     If you need a refill on your cardiac medications before your next appointment, please call your pharmacy.

## 2017-07-17 NOTE — Telephone Encounter (Signed)
Pt felt like he went into Afib yesterday.  Went to work this AM and felt bad.  Went to medical services at his job and they did an EKG that showed HR 150.  C/O dizziness.  Pt states he is in the parking lot as he thought we might want to see him.  Spoke with Dr. Katrinka Blazing and he advised for pt to come up for assessment.  Advised pt to come on up for an appt.  Pt appreciative.

## 2017-07-17 NOTE — Telephone Encounter (Signed)
New message   Patient states he started feeling signs of afib on Sunday  1) What is your heart rate? 150  2) Do you have a log of your heart rate readings (document readings)? no  3) Do you have any other symptoms? Dizziness  No chest  Pain No shortness of breath    .

## 2017-07-17 NOTE — Progress Notes (Signed)
 Cardiology Office Note    Date:  07/17/2017   ID:  Kais M Delpilar, DOB 05/20/1965, MRN [id number]  PCP:  Patient, No Pcp Per  Cardiologist: Lodie Waheed W Dwane Andres III, MD   Chief Complaint  Patient presents with  . Atrial Fibrillation    History of Present Illness:  Roshard M Lafond is a 51 y.o. male with history of paroxysmal atrial fibrillation, electrical cardioversion in July 2017, anticoagulation started that time, mild reduction in LV function with mildly depressed LVEF in the 45% range, and no recurrence of palpitations.  Has done well over the last 2 years.  Had atrial fibrillation in July 2017.  Cardioversion was performed with success.  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  Very physically active with no difficulty.  Starting Sunday morning he felt weak and dizzy but without palpitations.  Whenever he bends over and/or stands up he feels lightheaded and dizzy.  That has persisted into today.  He works for the [employer]  At work, he noticed that he was somewhat lightheaded and more fatigued than usual.  He went to the clinic and they found heart rate greater than 150 bpm.  We asked him to come into the office today and EKG demonstrates atrial fib with rapid ventricular response.  He had this in 2017 and required electrical cardioversion.  He had decreased LV function in the 40 to 45% range at that time.  EF improved to 45 to 50% several months after cardioversion.  He has been tested for sleep apnea but did not reach the threshold for therapy.   Past Medical History:  Diagnosis Date  . Allergy   . Atrial fibrillation (HCC) 08/04/2015   CHA2DS2-VASc = 3  . CHF (congestive heart failure) (HCC) 2004   one episode  . Diabetes mellitus without complication (HCC)   . Hx of sleep apnea   . Hyperlipidemia   . Hypertension   . Normal coronary arteries 2004    Past Surgical History:  Procedure Laterality Date  .  CARDIAC CATHETERIZATION  2005  . CARDIOVERSION N/A 08/05/2015   Procedure: CARDIOVERSION;  Surgeon: Mark C Skains, MD;  Location: [facility] ENDOSCOPY;  Service: Cardiovascular;  Laterality: N/A;  . HERNIA REPAIR    . TEE WITHOUT CARDIOVERSION N/A 08/05/2015   Procedure: TRANSESOPHAGEAL ECHOCARDIOGRAM (TEE);  Surgeon: Mark C Skains, MD;  Location: [facility] ENDOSCOPY;  Service: Cardiovascular;  Laterality: N/A;    Current Medications: Outpatient Medications Prior to Visit  Medication Sig Dispense Refill  . ELIQUIS 5 MG TABS tablet TAKE ONE TABLET BY MOUTH TWICE DAILY 180 tablet 1  . glipiZIDE (GLUCOTROL XL) 10 MG 24 hr tablet Take 1 tablet (10 mg total) by mouth 2 (two) times daily. 60 tablet 3  . ipratropium (ATROVENT) 0.03 % nasal spray Place 2 sprays into the nose 4 (four) times daily. (Patient not taking: Reported on 07/17/2017) 30 mL 6  . metFORMIN (GLUCOPHAGE) 1000 MG tablet Take 1 tablet (1,000 mg total) by mouth 2 (two) times daily with a meal. 180 tablet 3  . metoprolol succinate (TOPROL-XL) 25 MG 24 hr tablet Take 1 tablet (25 mg total) by mouth 2 (two) times daily. 180 tablet 3  . sildenafil (VIAGRA) 100 MG tablet Take 0.5-1 tablets (50-100 mg total) by mouth daily as needed for erectile dysfunction. (Patient not taking: Reported on 07/17/2017) 5 tablet 11  . acetaminophen (TYLENOL) 325 MG tablet Take 2 tablets (650 mg total) by mouth   every 4 (four) hours as needed for headache or mild pain. (Patient not taking: Reported on 07/17/2017)    . lisinopril-hydrochlorothiazide (PRINZIDE,ZESTORETIC) 20-12.5 MG tablet Take 0.5 tablets by mouth daily. 45 tablet 3   No facility-administered medications prior to visit.      Allergies:   Patient has no known allergies.   Social History   Socioeconomic History  . Marital status: Married    Spouse name: Not on file  . Number of children: Not on file  . Years of education: Not on file  . Highest education level: Not on file  Occupational History  . Not on  file  Social Needs  . Financial resource strain: Not on file  . Food insecurity:    Worry: Not on file    Inability: Not on file  . Transportation needs:    Medical: Not on file    Non-medical: Not on file  Tobacco Use  . Smoking status: Former Smoker  . Smokeless tobacco: Never Used  Substance and Sexual Activity  . Alcohol use: Yes    Comment: occ  . Drug use: No  . Sexual activity: Yes  Lifestyle  . Physical activity:    Days per week: Not on file    Minutes per session: Not on file  . Stress: Not on file  Relationships  . Social connections:    Talks on phone: Not on file    Gets together: Not on file    Attends religious service: Not on file    Active member of club or organization: Not on file    Attends meetings of clubs or organizations: Not on file    Relationship status: Not on file  Other Topics Concern  . Not on file  Social History Narrative  . Not on file     Family History:  The patient's family history includes Diabetes in his mother.   ROS:   Please see the history of present illness.    Has had some excessive sweating over the last several days, irregularity in heartbeat, and dizziness as noted above. All other systems reviewed and are negative.   PHYSICAL EXAM:   VS:  BP 100/68   Pulse 76   Ht 5' 9" (1.753 m)   Wt 290 lb (131.5 kg)   BMI 42.83 kg/m    GEN: Well nourished, morbidly obese, well developed, in no acute distress  HEENT: normal  Neck: no JVD, carotid bruits, or masses Cardiac: IIRR; no murmurs, rubs, or gallops,no edema.  Respiratory:  clear to auscultation bilaterally, normal work of breathing GI: soft, nontender, nondistended, + BS MS: no deformity or atrophy  Skin: warm and dry, no rash Neuro:  Alert and Oriented x 3, Strength and sensation are intact Psych: euthymic mood, full affect  Wt Readings from Last 3 Encounters:  07/17/17 290 lb (131.5 kg)  11/11/16 294 lb (133.4 kg)  05/05/16 (!) 303 lb (137.4 kg)       Studies/Labs Reviewed:   EKG:  EKG atrial fibrillation with RVR.  No injury pattern or other significant change when compared to prior tracing.  The atrial fibrillation is new.  Recent Labs: No results found for requested labs within last 8760 hours.   Lipid Panel    Component Value Date/Time   CHOL 182 08/25/2012 0818   TRIG 193 (H) 08/25/2012 0818   HDL 48 08/25/2012 0818   CHOLHDL 3.8 08/25/2012 0818   VLDL 39 08/25/2012 0818   LDLCALC 95 08/25/2012 0818      Additional studies/ records that were reviewed today include:  No new data    ASSESSMENT:    1. Atrial fibrillation, new onset (HCC)   2. Essential hypertension   3. Chronic anticoagulation   4. Dyslipidemia   5. Type 2 diabetes mellitus with stage 3 chronic kidney disease, unspecified whether long term insulin use (HCC)      PLAN:  In order of problems listed above:  1. Recurrent atrial fibrillation with RVR within the past 48 hours.  Plan to continue twice daily Eliquis, which she has not missed.  Elective cardioversion within the next 72 hours.  Abstain from physical work and driving if dizzy or lightheaded.  Stop Prinzide.  Increase Toprol-XL to 25 mg twice daily.  N.p.o. after midnight prior to the day of cardioversion.  Will need to consider antiarrhythmic therapy to prevent recurrent atrial fibrillation vs referral to EP.  Consider sotalol versus Multitaq.  Discussion about elective electrical cardioversion including risks of stroke, mechanical injury, airway injury if intubation, skin burn, and other potentially anesthesia related complications.  The patient is aware of these and has consented to proceed with outpatient elective cardioversion within 72 hours.  2. Currently, 100/60 mmHg.  Will stop ACE/diuretic combination for now and increase metoprolol succinate to 50 mg daily.  We will set up electrical cardioversion 07/19/2017 at noon time. 3. He has been on Eliquis continuously without any missed dose.   He is a trustworthy historian. 4. Not addressed 5. Diet and aerobic activity encouraged.  Elective electrical cardioversion followed by clinical reevaluation in 1 month with EKG.   Medication Adjustments/Labs and Tests Ordered: Current medicines are reviewed at length with the patient today.  Concerns regarding medicines are outlined above.  Medication changes, Labs and Tests ordered today are listed in the Patient Instructions below. Patient Instructions  Medication Instructions:  1) STOP Lisinopril/HCTZ 2) START Metoprolol Succinate 25mg twice daily  Labwork: BMET, CBC and INR today  Testing/Procedures: Your physician has recommended that you have a Cardioversion (DCCV). Electrical Cardioversion uses a jolt of electricity to your heart either through paddles or wired patches attached to your chest. This is a controlled, usually prescheduled, procedure. Defibrillation is done under light anesthesia in the hospital, and you usually go home the day of the procedure. This is done to get your heart back into a normal rhythm. You are not awake for the procedure. Please see the instruction sheet given to you today.   Follow-Up: Your physician recommends that you schedule a follow-up appointment in: 1-2 weeks after Cardioversion with an APP and an EKG.   Any Other Special Instructions Will Be Listed Below (If Applicable).     If you need a refill on your cardiac medications before your next appointment, please call your pharmacy.      Signed, Donjuan Robison W Antonea Gaut III, MD  07/17/2017 11:20 AM     [facility] [address], [place], [place]  [address] Phone: [phone]; Fax: [phone]  

## 2017-07-17 NOTE — H&P (View-Only) (Signed)
Cardiology Office Note    Date:  07/17/2017   ID:  Juan Schwartz, Juan Schwartz [birth year], MRN [id number]  PCP:  Patient, No Pcp Per  Cardiologist: Lesleigh Noe, MD   Chief Complaint  Patient presents with  . Atrial Fibrillation    History of Present Illness:  Juan Schwartz is a 52 y.o. male with history of paroxysmal atrial fibrillation, electrical cardioversion in July 2017, anticoagulation started that time, mild reduction in LV function with mildly depressed LVEF in the 45% range, and no recurrence of palpitations.  Has done well over the last 2 years.  Had atrial fibrillation in July 2017.  Cardioversion was performed with success.  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  Very physically active with no difficulty.  Starting Sunday morning he felt weak and dizzy but without palpitations.  Whenever he bends over and/or stands up he feels lightheaded and dizzy.  That has persisted into today.  He works for the [employer].  At work, he noticed that he was somewhat lightheaded and more fatigued than usual.  He went to the clinic and they found heart rate greater than 150 bpm.  We asked him to come into the office today and EKG demonstrates atrial fib with rapid ventricular response.  He had this in 2017 and required electrical cardioversion.  He had decreased LV function in the 40 to 45% range at that time.  EF improved to 45 to 50% several months after cardioversion.  He has been tested for sleep apnea but did not reach the threshold for therapy.   Past Medical History:  Diagnosis Date  . Allergy   . Atrial fibrillation (HCC) 08/04/2015   CHA2DS2-VASc = 3  . CHF (congestive heart failure) (HCC) 2004   one episode  . Diabetes mellitus without complication (HCC)   . Hx of sleep apnea   . Hyperlipidemia   . Hypertension   . Normal coronary arteries 2004    Past Surgical History:  Procedure Laterality Date  .  CARDIAC CATHETERIZATION  2005  . CARDIOVERSION N/A 08/05/2015   Procedure: CARDIOVERSION;  Surgeon: Jake Bathe, MD;  Location: [facility] ENDOSCOPY;  Service: Cardiovascular;  Laterality: N/A;  . HERNIA REPAIR    . TEE WITHOUT CARDIOVERSION N/A 08/05/2015   Procedure: TRANSESOPHAGEAL ECHOCARDIOGRAM (TEE);  Surgeon: Jake Bathe, MD;  Location: [facility] ENDOSCOPY;  Service: Cardiovascular;  Laterality: N/A;    Current Medications: Outpatient Medications Prior to Visit  Medication Sig Dispense Refill  . ELIQUIS 5 MG TABS tablet TAKE ONE TABLET BY MOUTH TWICE DAILY 180 tablet 1  . glipiZIDE (GLUCOTROL XL) 10 MG 24 hr tablet Take 1 tablet (10 mg total) by mouth 2 (two) times daily. 60 tablet 3  . ipratropium (ATROVENT) 0.03 % nasal spray Place 2 sprays into the nose 4 (four) times daily. (Patient not taking: Reported on 07/17/2017) 30 mL 6  . metFORMIN (GLUCOPHAGE) 1000 MG tablet Take 1 tablet (1,000 mg total) by mouth 2 (two) times daily with a meal. 180 tablet 3  . metoprolol succinate (TOPROL-XL) 25 MG 24 hr tablet Take 1 tablet (25 mg total) by mouth 2 (two) times daily. 180 tablet 3  . sildenafil (VIAGRA) 100 MG tablet Take 0.5-1 tablets (50-100 mg total) by mouth daily as needed for erectile dysfunction. (Patient not taking: Reported on 07/17/2017) 5 tablet 11  . acetaminophen (TYLENOL) 325 MG tablet Take 2 tablets (650 mg total) by mouth  every 4 (four) hours as needed for headache or mild pain. (Patient not taking: Reported on 07/17/2017)    . lisinopril-hydrochlorothiazide (PRINZIDE,ZESTORETIC) 20-12.5 MG tablet Take 0.5 tablets by mouth daily. 45 tablet 3   No facility-administered medications prior to visit.      Allergies:   Patient has no known allergies.   Social History   Socioeconomic History  . Marital status: Married    Spouse name: Not on file  . Number of children: Not on file  . Years of education: Not on file  . Highest education level: Not on file  Occupational History  . Not on  file  Social Needs  . Financial resource strain: Not on file  . Food insecurity:    Worry: Not on file    Inability: Not on file  . Transportation needs:    Medical: Not on file    Non-medical: Not on file  Tobacco Use  . Smoking status: Former Games developer  . Smokeless tobacco: Never Used  Substance and Sexual Activity  . Alcohol use: Yes    Comment: occ  . Drug use: No  . Sexual activity: Yes  Lifestyle  . Physical activity:    Days per week: Not on file    Minutes per session: Not on file  . Stress: Not on file  Relationships  . Social connections:    Talks on phone: Not on file    Gets together: Not on file    Attends religious service: Not on file    Active member of club or organization: Not on file    Attends meetings of clubs or organizations: Not on file    Relationship status: Not on file  Other Topics Concern  . Not on file  Social History Narrative  . Not on file     Family History:  The patient's family history includes Diabetes in his mother.   ROS:   Please see the history of present illness.    Has had some excessive sweating over the last several days, irregularity in heartbeat, and dizziness as noted above. All other systems reviewed and are negative.   PHYSICAL EXAM:   VS:  BP 100/68   Pulse 76   Ht 5\' 9"  (1.753 m)   Wt 290 lb (131.5 kg)   BMI 42.83 kg/m    GEN: Well nourished, morbidly obese, well developed, in no acute distress  HEENT: normal  Neck: no JVD, carotid bruits, or masses Cardiac: IIRR; no murmurs, rubs, or gallops,no edema.  Respiratory:  clear to auscultation bilaterally, normal work of breathing GI: soft, nontender, nondistended, + BS MS: no deformity or atrophy  Skin: warm and dry, no rash Neuro:  Alert and Oriented x 3, Strength and sensation are intact Psych: euthymic mood, full affect  Wt Readings from Last 3 Encounters:  07/17/17 290 lb (131.5 kg)  11/11/16 294 lb (133.4 kg)  05/05/16 (!) 303 lb (137.4 kg)       Studies/Labs Reviewed:   EKG:  EKG atrial fibrillation with RVR.  No injury pattern or other significant change when compared to prior tracing.  The atrial fibrillation is new.  Recent Labs: No results found for requested labs within last 8760 hours.   Lipid Panel    Component Value Date/Time   CHOL 182 08/25/2012 0818   TRIG 193 (H) 08/25/2012 0818   HDL 48 08/25/2012 0818   CHOLHDL 3.8 08/25/2012 0818   VLDL 39 08/25/2012 0818   LDLCALC 95 08/25/2012 0818  Additional studies/ records that were reviewed today include:  No new data    ASSESSMENT:    1. Atrial fibrillation, new onset (HCC)   2. Essential hypertension   3. Chronic anticoagulation   4. Dyslipidemia   5. Type 2 diabetes mellitus with stage 3 chronic kidney disease, unspecified whether long term insulin use (HCC)      PLAN:  In order of problems listed above:  1. Recurrent atrial fibrillation with RVR within the past 48 hours.  Plan to continue twice daily Eliquis, which she has not missed.  Elective cardioversion within the next 72 hours.  Abstain from physical work and driving if dizzy or lightheaded.  Stop Prinzide.  Increase Toprol-XL to 25 mg twice daily.  N.p.o. after midnight prior to the day of cardioversion.  Will need to consider antiarrhythmic therapy to prevent recurrent atrial fibrillation vs referral to EP.  Consider sotalol versus Multitaq.  Discussion about elective electrical cardioversion including risks of stroke, mechanical injury, airway injury if intubation, skin burn, and other potentially anesthesia related complications.  The patient is aware of these and has consented to proceed with outpatient elective cardioversion within 72 hours.  2. Currently, 100/60 mmHg.  Will stop ACE/diuretic combination for now and increase metoprolol succinate to 50 mg daily.  We will set up electrical cardioversion 07/19/2017 at noon time. 3. He has been on Eliquis continuously without any missed dose.   He is a Advice worker historian. 4. Not addressed 5. Diet and aerobic activity encouraged.  Elective electrical cardioversion followed by clinical reevaluation in 1 month with EKG.   Medication Adjustments/Labs and Tests Ordered: Current medicines are reviewed at length with the patient today.  Concerns regarding medicines are outlined above.  Medication changes, Labs and Tests ordered today are listed in the Patient Instructions below. Patient Instructions  Medication Instructions:  1) STOP Lisinopril/HCTZ 2) START Metoprolol Succinate 25mg  twice daily  Labwork: BMET, CBC and INR today  Testing/Procedures: Your physician has recommended that you have a Cardioversion (DCCV). Electrical Cardioversion uses a jolt of electricity to your heart either through paddles or wired patches attached to your chest. This is a controlled, usually prescheduled, procedure. Defibrillation is done under light anesthesia in the hospital, and you usually go home the day of the procedure. This is done to get your heart back into a normal rhythm. You are not awake for the procedure. Please see the instruction sheet given to you today.   Follow-Up: Your physician recommends that you schedule a follow-up appointment in: 1-2 weeks after Cardioversion with an APP and an EKG.   Any Other Special Instructions Will Be Listed Below (If Applicable).     If you need a refill on your cardiac medications before your next appointment, please call your pharmacy.      Signed, Lesleigh Noe, MD  07/17/2017 11:20 AM    [facility] [address] [place], [place], [place]  [address] Phone: [phone]; Fax: [phone]

## 2017-07-19 ENCOUNTER — Other Ambulatory Visit: Payer: Self-pay

## 2017-07-19 ENCOUNTER — Ambulatory Visit (HOSPITAL_COMMUNITY): Payer: 59 | Admitting: Certified Registered Nurse Anesthetist

## 2017-07-19 ENCOUNTER — Ambulatory Visit (HOSPITAL_COMMUNITY)
Admission: RE | Admit: 2017-07-19 | Discharge: 2017-07-19 | Disposition: A | Discharge status: Home / Self Care | Payer: 59 | Source: Ambulatory Visit | Attending: Cardiovascular Disease | Admitting: Cardiovascular Disease

## 2017-07-19 ENCOUNTER — Encounter (HOSPITAL_COMMUNITY): Payer: Self-pay | Admitting: *Deleted

## 2017-07-19 ENCOUNTER — Encounter (HOSPITAL_COMMUNITY)
Admission: RE | Disposition: A | Discharge status: Home / Self Care | Payer: Self-pay | Source: Ambulatory Visit | Attending: Cardiovascular Disease

## 2017-07-19 DIAGNOSIS — Z7984 Long term (current) use of oral hypoglycemic drugs: Secondary | ICD-10-CM | POA: Diagnosis not present

## 2017-07-19 DIAGNOSIS — E785 Hyperlipidemia, unspecified: Secondary | ICD-10-CM | POA: Diagnosis not present

## 2017-07-19 DIAGNOSIS — N183 Chronic kidney disease, stage 3 (moderate): Secondary | ICD-10-CM | POA: Diagnosis not present

## 2017-07-19 DIAGNOSIS — Z7901 Long term (current) use of anticoagulants: Secondary | ICD-10-CM | POA: Insufficient documentation

## 2017-07-19 DIAGNOSIS — G4730 Sleep apnea, unspecified: Secondary | ICD-10-CM | POA: Diagnosis not present

## 2017-07-19 DIAGNOSIS — Z6841 Body Mass Index (BMI) 40.0 and over, adult: Secondary | ICD-10-CM | POA: Diagnosis not present

## 2017-07-19 DIAGNOSIS — I480 Paroxysmal atrial fibrillation: Secondary | ICD-10-CM | POA: Insufficient documentation

## 2017-07-19 DIAGNOSIS — Z87891 Personal history of nicotine dependence: Secondary | ICD-10-CM | POA: Diagnosis not present

## 2017-07-19 DIAGNOSIS — I481 Persistent atrial fibrillation: Secondary | ICD-10-CM

## 2017-07-19 DIAGNOSIS — I129 Hypertensive chronic kidney disease with stage 1 through stage 4 chronic kidney disease, or unspecified chronic kidney disease: Secondary | ICD-10-CM | POA: Insufficient documentation

## 2017-07-19 DIAGNOSIS — E1122 Type 2 diabetes mellitus with diabetic chronic kidney disease: Secondary | ICD-10-CM | POA: Insufficient documentation

## 2017-07-19 HISTORY — PX: CARDIOVERSION: SHX1299

## 2017-07-19 LAB — GLUCOSE, CAPILLARY: GLUCOSE-CAPILLARY: 251 mg/dL — AB (ref 70–99)

## 2017-07-19 SURGERY — CARDIOVERSION
Anesthesia: General

## 2017-07-19 MED ORDER — PROPOFOL 10 MG/ML IV BOLUS
INTRAVENOUS | Status: DC | PRN
  Administered 2017-07-19: 75 mg via INTRAVENOUS
  Administered 2017-07-19: 25 mg via INTRAVENOUS

## 2017-07-19 MED ORDER — SODIUM CHLORIDE 0.9 % IV SOLN
INTRAVENOUS | Status: AC | PRN
  Administered 2017-07-19: 500 mL via INTRAVENOUS

## 2017-07-19 MED ORDER — LIDOCAINE 2% (20 MG/ML) 5 ML SYRINGE
INTRAMUSCULAR | Status: DC | PRN
  Administered 2017-07-19: 100 mg via INTRAVENOUS

## 2017-07-19 NOTE — Interval H&P Note (Signed)
History and Physical Interval Note:  07/19/2017 10:19 AM  Juan Schwartz  has presented today for surgery, with the diagnosis of A-FIB  The various methods of treatment have been discussed with the patient and family. After consideration of risks, benefits and other options for treatment, the patient has consented to  Procedure(s): CARDIOVERSION (N/A) as a surgical intervention .  The patient's history has been reviewed, patient examined, no change in status, stable for surgery.  I have reviewed the patient's chart and labs.  Questions were answered to the patient's satisfaction.     Kristeen Miss

## 2017-07-19 NOTE — Anesthesia Postprocedure Evaluation (Signed)
Anesthesia Post Note  Patient: Juan Schwartz  Procedure(s) Performed: CARDIOVERSION (N/A )     Patient location during evaluation: PACU Anesthesia Type: General Level of consciousness: awake and alert Pain management: pain level controlled Vital Signs Assessment: post-procedure vital signs reviewed and stable Respiratory status: spontaneous breathing, nonlabored ventilation, respiratory function stable and patient connected to nasal cannula oxygen Cardiovascular status: blood pressure returned to baseline and stable Postop Assessment: no apparent nausea or vomiting Anesthetic complications: no    Last Vitals:  Vitals:   07/19/17 1050 07/19/17 1100  BP: 101/79 106/83  Pulse: 84 74  Resp: 16 19  Temp:    SpO2: 99% 100%    Last Pain:  Vitals:   07/19/17 1100  TempSrc:   PainSc: 0-No pain                 Phillips Grout

## 2017-07-19 NOTE — Anesthesia Preprocedure Evaluation (Signed)
Anesthesia Evaluation  Patient identified by MRN, date of birth, ID band Patient awake    Reviewed: Allergy & Precautions, NPO status , Patient's Chart, lab work & pertinent test results  Airway Mallampati: II  TM Distance: >3 FB Neck ROM: Full    Dental no notable dental hx.    Pulmonary sleep apnea , former smoker,    Pulmonary exam normal breath sounds clear to auscultation       Cardiovascular hypertension, Pt. on medications + dysrhythmias Atrial Fibrillation  Rhythm:Irregular Rate:Normal     Neuro/Psych negative neurological ROS  negative psych ROS   GI/Hepatic negative GI ROS, Neg liver ROS,   Endo/Other  negative endocrine ROSdiabetesMorbid obesity  Renal/GU negative Renal ROS  negative genitourinary   Musculoskeletal negative musculoskeletal ROS (+)   Abdominal   Peds negative pediatric ROS (+)  Hematology negative hematology ROS (+)   Anesthesia Other Findings   Reproductive/Obstetrics negative OB ROS                             Anesthesia Physical Anesthesia Plan  ASA: III  Anesthesia Plan: General   Post-op Pain Management:    Induction: Intravenous  PONV Risk Score and Plan: 2 and Treatment may vary due to age or medical condition  Airway Management Planned: Mask  Additional Equipment:   Intra-op Plan:   Post-operative Plan: Extubation in OR  Informed Consent: I have reviewed the patients History and Physical, chart, labs and discussed the procedure including the risks, benefits and alternatives for the proposed anesthesia with the patient or authorized representative who has indicated his/her understanding and acceptance.   Dental advisory given  Plan Discussed with: CRNA  Anesthesia Plan Comments:         Anesthesia Quick Evaluation

## 2017-07-19 NOTE — Transfer of Care (Signed)
Immediate Anesthesia Transfer of Care Note  Patient: Juan Schwartz  Procedure(s) Performed: CARDIOVERSION (N/A )  Patient Location: Endoscopy Unit  Anesthesia Type:General  Level of Consciousness: drowsy and patient cooperative  Airway & Oxygen Therapy: Patient Spontanous Breathing  Post-op Assessment: Report given to RN, Post -op Vital signs reviewed and stable and Patient moving all extremities X 4  Post vital signs: Reviewed and stable  Last Vitals:  Vitals Value Taken Time  BP    Temp    Pulse    Resp    SpO2      Last Pain:  Vitals:   07/19/17 0944  TempSrc: Oral  PainSc: 0-No pain         Complications: No apparent anesthesia complications

## 2017-07-19 NOTE — CV Procedure (Signed)
    Cardioversion Note  Juan Schwartz [id number] 09/26/1965  Procedure: DC Cardioversion Indications: atrial fib   Procedure Details Consent: Obtained Time Out: Verified patient identification, verified procedure, site/side was marked, verified correct patient position, special equipment/implants available, Radiology Safety Procedures followed,  medications/allergies/relevent history reviewed, required imaging and test results available.  Performed  The patient has been on adequate anticoagulation.  The patient received IV Lidocaine 100 mg followed by Propofol 100 mg  for sedation.  Synchronous cardioversion was performed at 150  joules.  The cardioversion was successful.     Complications: No apparent complications Patient did tolerate procedure well.   Vesta Mixer, Montez Hageman., MD, [facility] 07/19/2017, 11:14 AM

## 2017-07-19 NOTE — Discharge Instructions (Signed)
Electrical Cardioversion, Care After °This sheet gives you information about how to care for yourself after your procedure. Your health care provider may also give you more specific instructions. If you have problems or questions, contact your health care provider. °What can I expect after the procedure? °After the procedure, it is common to have: °· Some redness on the skin where the shocks were given. ° °Follow these instructions at home: °· Do not drive for 24 hours if you were given a medicine to help you relax (sedative). °· Take over-the-counter and prescription medicines only as told by your health care provider. °· Ask your health care provider how to check your pulse. Check it often. °· Rest for 48 hours after the procedure or as told by your health care provider. °· Avoid or limit your caffeine use as told by your health care provider. °Contact a health care provider if: °· You feel like your heart is beating too quickly or your pulse is not regular. °· You have a serious muscle cramp that does not go away. °Get help right away if: °· You have discomfort in your chest. °· You are dizzy or you feel faint. °· You have trouble breathing or you are short of breath. °· Your speech is slurred. °· You have trouble moving an arm or leg on one side of your body. °· Your fingers or toes turn cold or blue. °This information is not intended to replace advice given to you by your health care provider. Make sure you discuss any questions you have with your health care provider. °Document Released: 10/31/2012 Document Revised: 08/14/2015 Document Reviewed: 07/17/2015 °Elsevier Interactive Patient Education © 2018 [employer] Inc[employer] ° °

## 2017-07-31 ENCOUNTER — Encounter: Payer: Self-pay | Admitting: Physician Assistant

## 2017-07-31 ENCOUNTER — Ambulatory Visit: Payer: 59 | Admitting: Physician Assistant

## 2017-07-31 VITALS — BP 124/74 | HR 66 | Ht 68.0 in | Wt 289.0 lb

## 2017-07-31 DIAGNOSIS — I420 Dilated cardiomyopathy: Secondary | ICD-10-CM | POA: Diagnosis not present

## 2017-07-31 DIAGNOSIS — I10 Essential (primary) hypertension: Secondary | ICD-10-CM | POA: Diagnosis not present

## 2017-07-31 DIAGNOSIS — I480 Paroxysmal atrial fibrillation: Secondary | ICD-10-CM | POA: Diagnosis not present

## 2017-07-31 DIAGNOSIS — E119 Type 2 diabetes mellitus without complications: Secondary | ICD-10-CM

## 2017-07-31 MED ORDER — LISINOPRIL 2.5 MG PO TABS: 2.5000 mg | ORAL_TABLET | Freq: Every day | ORAL | 3 refills | Status: DC

## 2017-07-31 NOTE — Patient Instructions (Addendum)
Medication Instructions:  1. START LISINOPRIL 2.5 MG 1 TABLET DAILY; RX HAS BEEN SENT IN   Labwork: NONE ORDERED TODAY  Testing/Procedures: NONE ORDERED TODAY  Follow-Up: DR. Katrinka Blazing ON November 02, 2017 @ 10:20 AM   Any Other Special Instructions Will Be Listed Below (If Applicable).     If you need a refill on your cardiac medications before your next appointment, please call your pharmacy.

## 2017-07-31 NOTE — Progress Notes (Signed)
Cardiology Office Note:    Date:  07/31/2017   ID:  Juan Schwartz, DOB 09/25/1965, MRN [id number]  PCP:  Patient, No Pcp Per  Cardiologist:  Lesleigh Noe, MD   Referring MD: No ref. provider found   Chief Complaint  Patient presents with  . Hospitalization Follow-up    Status post cardioversion    History of Present Illness:    Juan Schwartz is a 52 y.o. male with paroxysmal atrial fibrillation, mild reduction in LV systolic function, hypertension, hyperlipidemia, diabetes.  He had an episode of atrial fibrillation in July 2017 requiring cardioversion.  He was recently evaluated by Dr. Katrinka Blazing July 17, 2017 with recurrent atrial fibrillation with rapid ventricular rate.  His beta-blocker dose was increased.  His ACE inhibitor/thiazide diuretic combination drug was placed on hold due to low blood pressure.  Cardioversion was arranged and performed on 07/19/2017.  Juan Schwartz for follow-up after recent cardioversion.  Since his procedure, he has done well.  He denies chest pain, shortness of breath, palpitations, syncope or near syncope, leg swelling.  He denies any bleeding issues.  He remains on Apixaban.  Prior CV studies:   The following studies were reviewed today:  Echo 08/05/2015 Moderate concentric LVH, EF 45-50, diffuse HK, grade 2 diastolic dysfunction, mild LAE, trivial TR  TEE 08/05/2015 EF 40-45, diffuse HK  Cardiac catheterization 04/10/2002 Normal coronary arteries EF 50  Past Medical History:  Diagnosis Date  . Allergy   . Atrial fibrillation (HCC) 08/04/2015   CHA2DS2-VASc = 3  . CHF (congestive heart failure) (HCC) 2004   one episode  . Dehydration 08/04/2015  . Diabetes mellitus without complication (HCC)   . Diabetes mellitus, type 2 (HCC) 11/29/2011  . Dyslipidemia 08/04/2015  . Essential hypertension 08/04/2015  . Hx of sleep apnea   . Hyperlipidemia   . Hypertension   . Hypotension 08/04/2015  . Normal coronary arteries 2004  .  Obesity 11/29/2011  . Sleep apnea-untreated 12/16/2011   Surgical Hx: The patient  has a past surgical history that includes Hernia repair; Cardiac catheterization (2005); Cardioversion (N/A, 08/05/2015); TEE without cardioversion (N/A, 08/05/2015); and Cardioversion (N/A, 07/19/2017).   Current Medications: Current Meds  Medication Sig  . acetaminophen (TYLENOL) 500 MG tablet Take 500 mg by mouth every 6 (six) hours as needed for moderate pain or headache.  Marland Kitchen ELIQUIS 5 MG TABS tablet TAKE ONE TABLET BY MOUTH TWICE DAILY  . glipiZIDE (GLUCOTROL XL) 10 MG 24 hr tablet Take 1 tablet (10 mg total) by mouth 2 (two) times daily.  Marland Kitchen ipratropium (ATROVENT) 0.03 % nasal spray Place 2 sprays into the nose 4 (four) times daily.  . metFORMIN (GLUCOPHAGE) 1000 MG tablet Take 1 tablet (1,000 mg total) by mouth 2 (two) times daily with a meal.  . metoprolol succinate (TOPROL-XL) 25 MG 24 hr tablet Take 1 tablet (25 mg total) by mouth 2 (two) times daily.  . Multiple Vitamins-Minerals (MEGA MULTI MEN) TABS Take 1 tablet by mouth daily.  . sildenafil (VIAGRA) 100 MG tablet Take 0.5-1 tablets (50-100 mg total) by mouth daily as needed for erectile dysfunction.     Allergies:   Patient has no known allergies.   Social History   Tobacco Use  . Smoking status: Former Games developer  . Smokeless tobacco: Never Used  Substance Use Topics  . Alcohol use: Yes    Comment: occ  . Drug use: No     Family Hx: The patient's family history includes Diabetes  in his mother.  ROS:   Please see the history of present illness.    ROS All other systems reviewed and are negative.   EKGs/Labs/Other Test Reviewed:    EKG:  EKG is  ordered today.  The ekg ordered today demonstrates normal sinus rhythm, heart rate 66, rightward axis, QTC 438  Recent Labs: 07/17/2017: BUN 19; Creatinine, Ser 0.82; Hemoglobin 15.0; Platelets 200; Potassium 5.0; Sodium 136   Recent Lipid Panel Lab Results  Component Value Date/Time   CHOL  182 08/25/2012 08:18 AM   TRIG 193 (H) 08/25/2012 08:18 AM   HDL 48 08/25/2012 08:18 AM   CHOLHDL 3.8 08/25/2012 08:18 AM   LDLCALC 95 08/25/2012 08:18 AM    Physical Exam:    VS:  BP 124/74   Pulse 66   Ht 5\' 8"  (1.727 m)   Wt 289 lb (131.1 kg)   SpO2 98%   BMI 43.94 kg/m     Wt Readings from Last 3 Encounters:  07/31/17 289 lb (131.1 kg)  07/19/17 283 lb (128.4 kg)  07/17/17 290 lb (131.5 kg)     Physical Exam  Constitutional: He is oriented to person, place, and time. He appears well-developed and well-nourished. No distress.  HENT:  Head: Normocephalic and atraumatic.  Neck: Neck supple.  Cardiovascular: Normal rate, regular rhythm, S1 normal, S2 normal and normal heart sounds.  No murmur heard. Pulmonary/Chest: Effort normal. He has no rales.  Abdominal: Soft.  Musculoskeletal: He exhibits edema (trace bilat LE edema).  Neurological: He is alert and oriented to person, place, and time.  Skin: Skin is warm and dry.    ASSESSMENT & PLAN:    Paroxysmal atrial fibrillation (HCC)  Maintaining normal sinus rhythm after recent cardioversion.  He is tolerating anticoagulation with Apixaban.  I discussed his case with Dr. Katrinka Blazing.  We will hold off on adding any antiarrhythmics at this point.  If he has recurrent atrial fibrillation, we will need to start him on antiarrhythmic drug therapy.  Of note, he had a sleep study 2 years ago.  This showed very mild sleep apnea and CPAP was apparently not recommended.  Essential hypertension The patient's blood pressure is controlled on his current regimen.  Continue current therapy.   Type 2 diabetes mellitus without complication, without long-term current use of insulin (HCC) Continue follow-up with primary care.  I provided him with copy of his recent labs to take to his primary care physician.  Dilated cardiomyopathy EF has been reduced in the past.  This was likely related to tachycardia.  He was recently taken off of his ACE  inhibitor/thiazide diuretic combination due to low blood pressure.  Continue beta-blocker.  I have recommended that he start back on lisinopril 2.5 mg daily.  -Lisinopril 2.5 mg Once daily   Dispo:  Return in about 3 months (around 10/31/2017) for Routine Follow Up w/ Dr. Katrinka Blazing.   Medication Adjustments/Labs and Tests Ordered: Current medicines are reviewed at length with the patient today.  Concerns regarding medicines are outlined above.  Tests Ordered: Orders Placed This Encounter  Procedures  . EKG 12-Lead   Medication Changes: Meds ordered this encounter  Medications  . lisinopril (PRINIVIL,ZESTRIL) 2.5 MG tablet    Sig: Take 1 tablet (2.5 mg total) by mouth daily.    Dispense:  90 tablet    Refill:  3    Signed, Tereso Newcomer, PA-C  07/31/2017 5:19 PM    [facility] [address] [place], [place],  [place]  [address] Phone: [phone]; Fax: [phone]

## 2017-10-14 IMAGING — CR DG CHEST 1V PORT
1 series · 1 of 1 positions shown · non-contrast
Comparison: November 29, 2011

CLINICAL DATA: Dizziness today.  History of atrial fibrillation.

EXAM:
PORTABLE CHEST 1 VIEW

[AP]
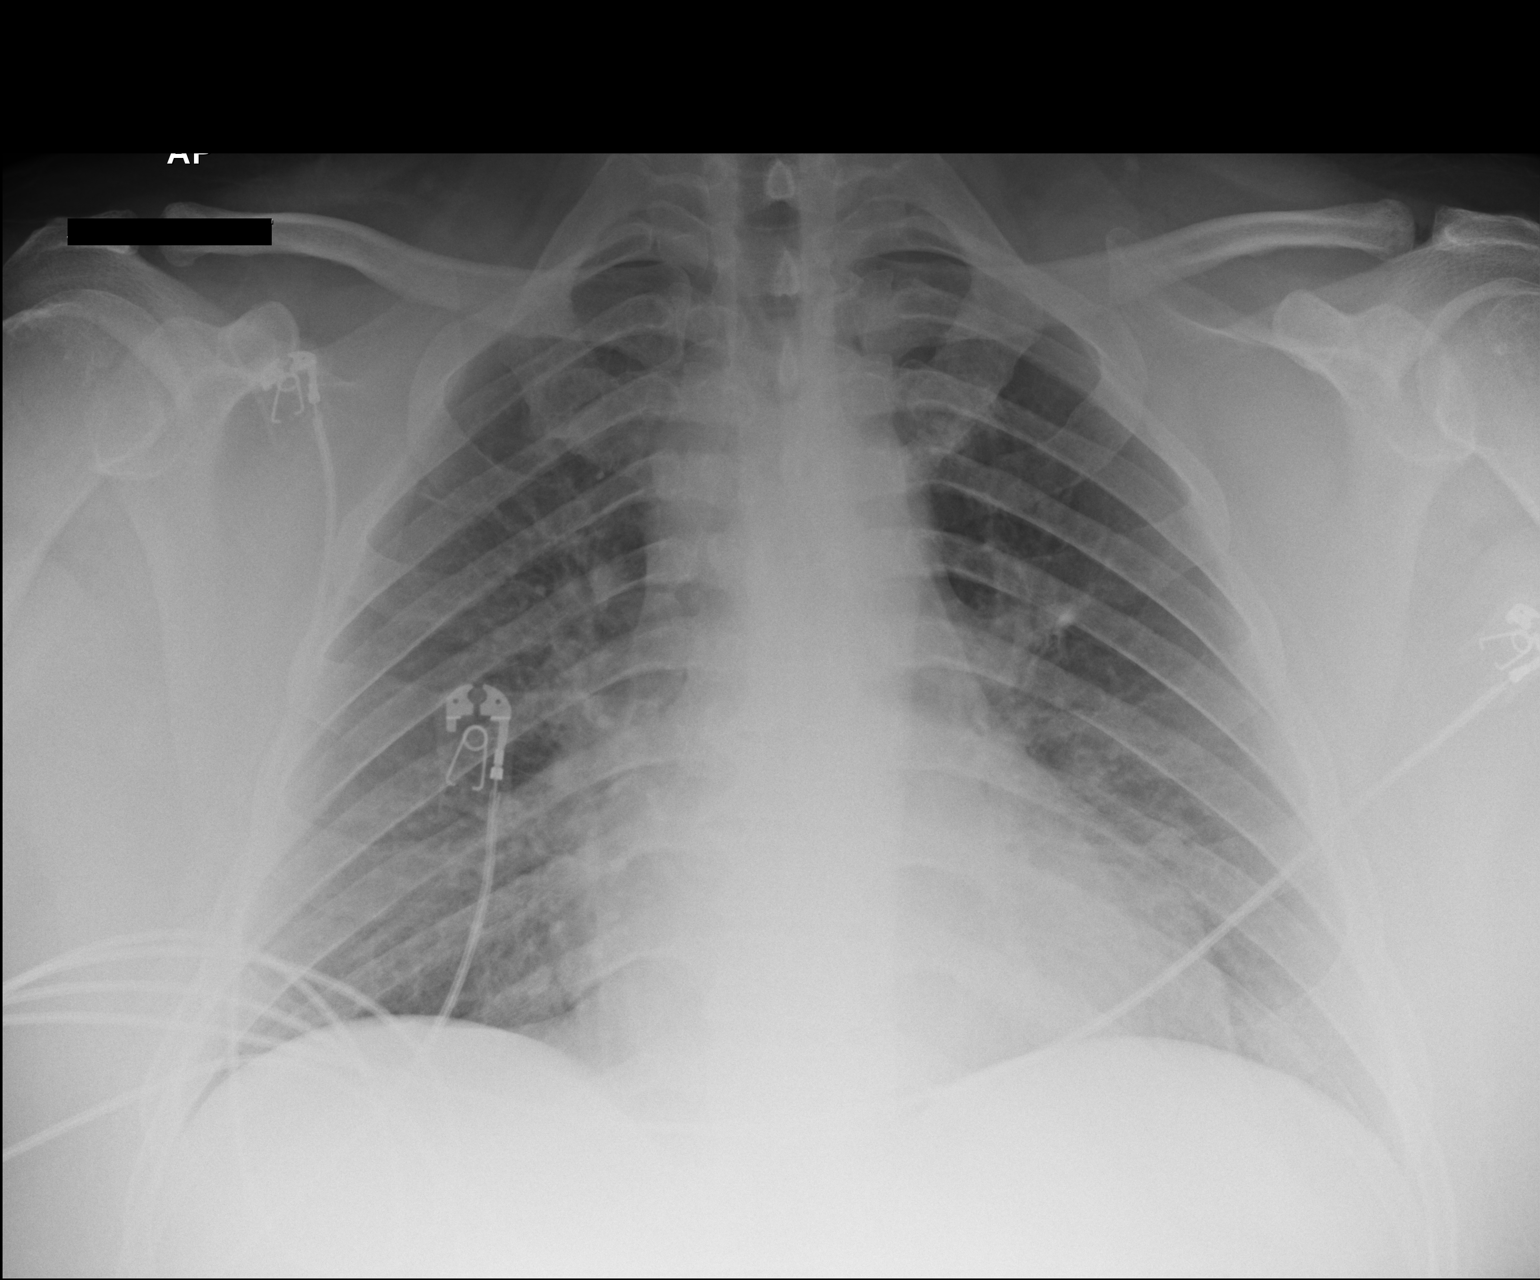

[1 of 1 positions shown; findings below may reference images not displayed]

FINDINGS: The heart size and mediastinal contours are stable. The heart size
is mildly enlarged. There is no focal infiltrate, pulmonary edema,
or pleural effusion. The visualized skeletal structures are
unremarkable.
IMPRESSION: No active cardiopulmonary disease.

## 2017-11-02 ENCOUNTER — Encounter: Payer: Self-pay | Admitting: Interventional Cardiology

## 2017-11-02 ENCOUNTER — Ambulatory Visit: Payer: 59 | Admitting: Interventional Cardiology

## 2017-11-02 VITALS — BP 128/72 | HR 109 | Ht 69.0 in | Wt 297.8 lb

## 2017-11-02 DIAGNOSIS — I10 Essential (primary) hypertension: Secondary | ICD-10-CM

## 2017-11-02 DIAGNOSIS — E785 Hyperlipidemia, unspecified: Secondary | ICD-10-CM

## 2017-11-02 DIAGNOSIS — I4891 Unspecified atrial fibrillation: Secondary | ICD-10-CM

## 2017-11-02 DIAGNOSIS — E1100 Type 2 diabetes mellitus with hyperosmolarity without nonketotic hyperglycemic-hyperosmolar coma (NKHHC): Secondary | ICD-10-CM

## 2017-11-02 DIAGNOSIS — G4730 Sleep apnea, unspecified: Secondary | ICD-10-CM | POA: Diagnosis not present

## 2017-11-02 NOTE — Progress Notes (Signed)
Cardiology Office Note:    Date:  11/02/2017   ID:  Juan Schwartz, DOB 1966/01/24, MRN [id number]  PCP:  Patient, No Pcp Per  Cardiologist:  Lesleigh Noe, MD   Referring MD: No ref. provider found   Chief Complaint  Patient presents with  . Irregular Heart Beat    PVC    History of Present Illness:    Juan Schwartz is a 52 y.o. male with a hx of paroxysmal atrial fibrillation, electrical cardioversion in July 2017, anticoagulation started that time, mild reduction in LV function with mildly depressed LVEF in the 45% range, and no recurrence of palpitations.   Patient has a history of paroxysmal atrial fibrillation.  He is here today for routine follow-up.  He is asymptomatic.  He works for the city.  He is in atrial fibrillation today and has no symptoms at all.  He denies orthopnea, PND, lower extremity edema.  No chest pain.  He does not have sleep apnea.  To prior tests have been negative.     Past Medical History:  Diagnosis Date  . Allergy   . Atrial fibrillation (HCC) 08/04/2015   CHA2DS2-VASc = 3  . CHF (congestive heart failure) (HCC) 2004   one episode  . Dehydration 08/04/2015  . Diabetes mellitus without complication (HCC)   . Diabetes mellitus, type 2 (HCC) 11/29/2011  . Dyslipidemia 08/04/2015  . Essential hypertension 08/04/2015  . Hx of sleep apnea   . Hyperlipidemia   . Hypertension   . Hypotension 08/04/2015  . Normal coronary arteries 2004  . Obesity 11/29/2011  . Sleep apnea-untreated 12/16/2011    Past Surgical History:  Procedure Laterality Date  . CARDIAC CATHETERIZATION  2005  . CARDIOVERSION N/A 08/05/2015   Procedure: CARDIOVERSION;  Surgeon: Jake Bathe, MD;  Location: [facility] ENDOSCOPY;  Service: Cardiovascular;  Laterality: N/A;  . CARDIOVERSION N/A 07/19/2017   Procedure: CARDIOVERSION;  Surgeon: Vesta Mixer, MD;  Location: [facility] ENDOSCOPY;  Service: Cardiovascular;  Laterality: N/A;  . HERNIA REPAIR    . TEE WITHOUT  CARDIOVERSION N/A 08/05/2015   Procedure: TRANSESOPHAGEAL ECHOCARDIOGRAM (TEE);  Surgeon: Jake Bathe, MD;  Location: [facility] ENDOSCOPY;  Service: Cardiovascular;  Laterality: N/A;    Current Medications: Current Meds  Medication Sig  . acetaminophen (TYLENOL) 500 MG tablet Take 500 mg by mouth every 6 (six) hours as needed for moderate pain or headache.  Marland Kitchen ELIQUIS 5 MG TABS tablet TAKE ONE TABLET BY MOUTH TWICE DAILY  . glipiZIDE (GLUCOTROL XL) 10 MG 24 hr tablet Take 1 tablet (10 mg total) by mouth 2 (two) times daily.  Marland Kitchen ipratropium (ATROVENT) 0.03 % nasal spray Place 2 sprays into the nose 4 (four) times daily.  Marland Kitchen lisinopril (PRINIVIL,ZESTRIL) 2.5 MG tablet Take 2.5 mg by mouth daily.  . metFORMIN (GLUCOPHAGE) 1000 MG tablet Take 1 tablet (1,000 mg total) by mouth 2 (two) times daily with a meal.  . metoprolol succinate (TOPROL-XL) 25 MG 24 hr tablet Take 1 tablet (25 mg total) by mouth 2 (two) times daily.  . Multiple Vitamins-Minerals (MEGA MULTI MEN) TABS Take 1 tablet by mouth daily.  . sildenafil (VIAGRA) 100 MG tablet Take 0.5-1 tablets (50-100 mg total) by mouth daily as needed for erectile dysfunction.     Allergies:   Patient has no known allergies.   Social History   Socioeconomic History  . Marital status: Married    Spouse name: Not on file  . Number of children: Not on  file  . Years of education: Not on file  . Highest education level: Not on file  Occupational History  . Not on file  Social Needs  . Financial resource strain: Not on file  . Food insecurity:    Worry: Not on file    Inability: Not on file  . Transportation needs:    Medical: Not on file    Non-medical: Not on file  Tobacco Use  . Smoking status: Former Games developer  . Smokeless tobacco: Never Used  Substance and Sexual Activity  . Alcohol use: Yes    Comment: occ  . Drug use: No  . Sexual activity: Yes  Lifestyle  . Physical activity:    Days per week: Not on file    Minutes per session: Not on  file  . Stress: Not on file  Relationships  . Social connections:    Talks on phone: Not on file    Gets together: Not on file    Attends religious service: Not on file    Active member of club or organization: Not on file    Attends meetings of clubs or organizations: Not on file    Relationship status: Not on file  Other Topics Concern  . Not on file  Social History Narrative  . Not on file     Family History: The patient's family history includes Diabetes in his mother.  ROS:   Please see the history of present illness.    No bleeding on anticoagulation therapy all other systems reviewed and are negative.  EKGs/Labs/Other Studies Reviewed:    The following studies were reviewed today: No data  EKG:  EKG is  ordered today.  The ekg ordered today demonstrates atrial fibrillation, rapid ventricular response to 103 bpm, otherwise unremarkable.  Recent Labs: 07/17/2017: BUN 19; Creatinine, Ser 0.82; Hemoglobin 15.0; Platelets 200; Potassium 5.0; Sodium 136  Recent Lipid Panel    Component Value Date/Time   CHOL 182 08/25/2012 0818   TRIG 193 (H) 08/25/2012 0818   HDL 48 08/25/2012 0818   CHOLHDL 3.8 08/25/2012 0818   VLDL 39 08/25/2012 0818   LDLCALC 95 08/25/2012 0818    Physical Exam:    VS:  BP 128/72   Pulse (!) 109   Ht 5\' 9"  (1.753 m)   Wt 297 lb 12.8 oz (135.1 kg)   BMI 43.98 kg/m     Wt Readings from Last 3 Encounters:  11/02/17 297 lb 12.8 oz (135.1 kg)  07/31/17 289 lb (131.1 kg)  07/19/17 283 lb (128.4 kg)     GEN: Obese.  Well nourished, well developed in no acute distress HEENT: Normal NECK: No JVD. LYMPHATICS: No lymphadenopathy CARDIAC: Irregularly irregular rapid rate/rhythm, no murmur, no gallop, no edema. VASCULAR: 2+ bilateral radial pulses.  No bruits. RESPIRATORY:  Clear to auscultation without rales, wheezing or rhonchi  ABDOMEN: Soft, non-tender, non-distended, No pulsatile mass, MUSCULOSKELETAL: No deformity  SKIN: Warm and  dry NEUROLOGIC:  Alert and oriented x 3 PSYCHIATRIC:  Normal affect   ASSESSMENT:    1. Atrial fibrillation, new onset (HCC)   2. Dyslipidemia   3. Essential hypertension   4. Sleep apnea, unspecified type   5. Type 2 diabetes mellitus with hyperosmolarity without coma, without long-term current use of insulin (HCC)    PLAN:    In order of problems listed above:  1. Asymptomatic but in atrial fibrillation with relatively rapid response.  He will wear a 48-hour Holter monitor to determine if this is  continuous and engage heart rate control.  Last LVEF was 45%.  May need to consider antiarrhythmic agent or ablation if appropriate. 2. LDL target should be less than 100.  This is followed by primary care.  Blood pressure target 130/80 mmHg. 3. States that recent studies have not documented sleep apnea. 4. Goal A1c is less than 7.  Recommended aerobic activity.  48-hour Holter monitor to determine if he is in continuous atrial fibrillation.  We may need to uptitrate beta-blocker therapy as heart rate not well controlled in atrial fibrillation on the current dose of metoprolol although he does not have symptoms currently.  We will determine if antiarrhythmic therapy for rhythm control versus referral to EP for consideration of ablation.  This will be based upon findings from monitor.   Medication Adjustments/Labs and Tests Ordered: Current medicines are reviewed at length with the patient today.  Concerns regarding medicines are outlined above.  No orders of the defined types were placed in this encounter.  No orders of the defined types were placed in this encounter.   There are no Patient Instructions on file for this visit.   Signed, Lesleigh Noe, MD  11/02/2017 11:05 AM    [facility]

## 2017-11-02 NOTE — Patient Instructions (Signed)
Medication Instructions:  Your physician recommends that you continue on your current medications as directed. Please refer to the Current Medication list given to you today.  If you need a refill on your cardiac medications before your next appointment, please call your pharmacy.   Lab work: None If you have labs (blood work) drawn today and your tests are completely normal, you will receive your results only by: Marland Kitchen MyChart Message (if you have MyChart) OR . A paper copy in the mail If you have any lab test that is abnormal or we need to change your treatment, we will call you to review the results.  Testing/Procedures: Your physician has recommended that you wear a 48 hour holter monitor. Holter monitors are medical devices that record the heart's electrical activity. Doctors most often use these monitors to diagnose arrhythmias. Arrhythmias are problems with the speed or rhythm of the heartbeat. The monitor is a small, portable device. You can wear one while you do your normal daily activities. This is usually used to diagnose what is causing palpitations/syncope (passing out).    Follow-Up: At [facility], you and your health needs are our priority.  As part of our continuing mission to provide you with exceptional heart care, we have created designated Provider Care Teams.  These Care Teams include your primary Cardiologist (physician) and Advanced Practice Providers (APPs -  Physician Assistants and Nurse Practitioners) who all work together to provide you with the care you need, when you need it. You will need a follow up appointment in 6 months.  Please call our office 2 months in advance to schedule this appointment.  You may see Lesleigh Noe, MD or one of the following Advanced Practice Providers on your designated Care Team:   Norma Fredrickson, NP Nada Boozer, NP . Georgie Chard, NP  Any Other Special Instructions Will Be Listed Below (If Applicable).

## 2017-11-03 ENCOUNTER — Encounter (HOSPITAL_COMMUNITY): Payer: Self-pay

## 2017-11-03 ENCOUNTER — Other Ambulatory Visit: Payer: Self-pay

## 2017-11-03 ENCOUNTER — Emergency Department (HOSPITAL_COMMUNITY)
Admission: EM | Admit: 2017-11-03 | Discharge: 2017-11-03 | Disposition: A | Discharge status: Home / Self Care | Payer: 59 | Attending: Emergency Medicine | Admitting: Emergency Medicine

## 2017-11-03 ENCOUNTER — Emergency Department (HOSPITAL_COMMUNITY): Payer: 59

## 2017-11-03 ENCOUNTER — Telehealth: Payer: Self-pay | Admitting: Interventional Cardiology

## 2017-11-03 DIAGNOSIS — I110 Hypertensive heart disease with heart failure: Secondary | ICD-10-CM | POA: Diagnosis not present

## 2017-11-03 DIAGNOSIS — R42 Dizziness and giddiness: Secondary | ICD-10-CM | POA: Diagnosis present

## 2017-11-03 DIAGNOSIS — Z87891 Personal history of nicotine dependence: Secondary | ICD-10-CM | POA: Insufficient documentation

## 2017-11-03 DIAGNOSIS — E119 Type 2 diabetes mellitus without complications: Secondary | ICD-10-CM | POA: Diagnosis not present

## 2017-11-03 DIAGNOSIS — Z79899 Other long term (current) drug therapy: Secondary | ICD-10-CM | POA: Diagnosis not present

## 2017-11-03 DIAGNOSIS — I480 Paroxysmal atrial fibrillation: Secondary | ICD-10-CM | POA: Insufficient documentation

## 2017-11-03 DIAGNOSIS — Z7901 Long term (current) use of anticoagulants: Secondary | ICD-10-CM | POA: Diagnosis not present

## 2017-11-03 DIAGNOSIS — I509 Heart failure, unspecified: Secondary | ICD-10-CM | POA: Diagnosis not present

## 2017-11-03 DIAGNOSIS — Z7984 Long term (current) use of oral hypoglycemic drugs: Secondary | ICD-10-CM | POA: Diagnosis not present

## 2017-11-03 DIAGNOSIS — I4891 Unspecified atrial fibrillation: Secondary | ICD-10-CM

## 2017-11-03 LAB — CBC
HCT: 45.1 % (ref 39.0–52.0)
Hemoglobin: 14.5 g/dL (ref 13.0–17.0)
MCH: 29.6 pg (ref 26.0–34.0)
MCHC: 32.2 g/dL (ref 30.0–36.0)
MCV: 92 fL (ref 80.0–100.0)
Platelets: 179 K/uL (ref 150–400)
RBC: 4.9 MIL/uL (ref 4.22–5.81)
RDW: 13.8 % (ref 11.5–15.5)
WBC: 11 K/uL — ABNORMAL HIGH (ref 4.0–10.5)
nRBC: 0 % (ref 0.0–0.2)

## 2017-11-03 LAB — BASIC METABOLIC PANEL WITH GFR
Anion gap: 11 (ref 5–15)
BUN: 22 mg/dL — ABNORMAL HIGH (ref 6–20)
CO2: 22 mmol/L (ref 22–32)
Calcium: 9.7 mg/dL (ref 8.9–10.3)
Chloride: 106 mmol/L (ref 98–111)
Creatinine, Ser: 0.98 mg/dL (ref 0.61–1.24)
GFR calc Af Amer: >60 mL/min (ref >60)
GFR calc non Af Amer: >60 mL/min (ref >60)
Glucose, Bld: 167 mg/dL — ABNORMAL HIGH (ref 70–99)
Potassium: 3.9 mmol/L (ref 3.5–5.1)
Sodium: 139 mmol/L (ref 135–145)

## 2017-11-03 NOTE — ED Provider Notes (Signed)
[facility] EMERGENCY DEPARTMENT Provider Note   CSN: [id number] Arrival date & time: 11/03/17  1625     History   Chief Complaint Chief Complaint  Patient presents with  . Atrial Fibrillation    HPI Juan Schwartz is a 52 y.o. male.  HPI   52 year old male with PMH stick for A. fib on Eliquis, CHF, T2DM, HTN and HLD who presents with chief complaint of A. fib and dizziness.  Patient states that he presented to his cardiologist yesterday was found to have heart rate in the 112's, a symptom medic.  This morning prior to work he saw his clinic at work and found to have heart rate 118.  During work patient had episode of lightheadedness upon standing lasting roughly 10 seconds, remitted with rest without chest pain nausea diaphoresis or abdominal pain.  Patient had a total of 3 episodes on a short period of time.  Patient went back to the clinic at his work and was found to have heart rate in the 140s prompting him to stop work and presented to the emergency department for further evaluation.  Patient is asymptomatic on initial exam.  Denies recent orthopnea, PND, bilateral lower extremity swelling.  Denies recent infectious symptoms.  Past Medical History:  Diagnosis Date  . Allergy   . Atrial fibrillation (HCC) 08/04/2015   CHA2DS2-VASc = 3  . CHF (congestive heart failure) (HCC) 2004   one episode  . Dehydration 08/04/2015  . Diabetes mellitus without complication (HCC)   . Diabetes mellitus, type 2 (HCC) 11/29/2011  . Dyslipidemia 08/04/2015  . Essential hypertension 08/04/2015  . Hx of sleep apnea   . Hyperlipidemia   . Hypertension   . Hypotension 08/04/2015  . Normal coronary arteries 2004  . Obesity 11/29/2011  . Sleep apnea-untreated 12/16/2011    Patient Active Problem List   Diagnosis Date Noted  . Atrial fibrillation, new onset (HCC) 08/04/2015  . Normal coronary arteries-2004 08/04/2015  . Essential hypertension 08/04/2015  . Dyslipidemia  08/04/2015  . Sleep apnea-untreated 12/16/2011  . Obesity 11/29/2011  . Diabetes mellitus, type 2 (HCC) 11/29/2011    Past Surgical History:  Procedure Laterality Date  . CARDIAC CATHETERIZATION  2005  . CARDIOVERSION N/A 08/05/2015   Procedure: CARDIOVERSION;  Surgeon: Jake Bathe, MD;  Location: [facility] ENDOSCOPY;  Service: Cardiovascular;  Laterality: N/A;  . CARDIOVERSION N/A 07/19/2017   Procedure: CARDIOVERSION;  Surgeon: Vesta Mixer, MD;  Location: [facility] ENDOSCOPY;  Service: Cardiovascular;  Laterality: N/A;  . HERNIA REPAIR    . TEE WITHOUT CARDIOVERSION N/A 08/05/2015   Procedure: TRANSESOPHAGEAL ECHOCARDIOGRAM (TEE);  Surgeon: Jake Bathe, MD;  Location: [facility] ENDOSCOPY;  Service: Cardiovascular;  Laterality: N/A;        Home Medications    Prior to Admission medications   Medication Sig Start Date End Date Taking? Authorizing Provider  acetaminophen (TYLENOL) 500 MG tablet Take 500 mg by mouth every 6 (six) hours as needed for moderate pain or headache.    [provider]  ELIQUIS 5 MG TABS tablet TAKE ONE TABLET BY MOUTH TWICE DAILY 12/26/16   Lyn Records, MD  glipiZIDE (GLUCOTROL XL) 10 MG 24 hr tablet Take 1 tablet (10 mg total) by mouth 2 (two) times daily. 08/27/12   Collene Gobble, MD  ipratropium (ATROVENT) 0.03 % nasal spray Place 2 sprays into the nose 4 (four) times daily. 09/11/12   Copland, Gwenlyn Found, MD  lisinopril (PRINIVIL,ZESTRIL) 2.5 MG tablet Take 2.5  mg by mouth daily.    [provider]  metFORMIN (GLUCOPHAGE) 1000 MG tablet Take 1 tablet (1,000 mg total) by mouth 2 (two) times daily with a meal. 08/25/12   Daub, Maylon Peppers, MD  metoprolol succinate (TOPROL-XL) 25 MG 24 hr tablet Take 1 tablet (25 mg total) by mouth 2 (two) times daily. 11/11/16   Lyn Records, MD  Multiple Vitamins-Minerals (MEGA MULTI MEN) TABS Take 1 tablet by mouth daily.    [provider]  sildenafil (VIAGRA) 100 MG tablet Take 0.5-1 tablets (50-100 mg total) by  mouth daily as needed for erectile dysfunction. 03/03/12   Collene Gobble, MD    Family History Family History  Problem Relation Age of Onset  . Diabetes Mother     Social History Social History   Tobacco Use  . Smoking status: Former Games developer  . Smokeless tobacco: Never Used  Substance Use Topics  . Alcohol use: Yes    Comment: occ  . Drug use: No     Allergies   Patient has no known allergies.   Review of Systems Review of Systems  Constitutional: Negative for chills and fever.  HENT: Negative for ear pain and sore throat.   Eyes: Negative for pain and visual disturbance.  Respiratory: Negative for cough and shortness of breath.   Cardiovascular: Negative for chest pain, palpitations and leg swelling.  Gastrointestinal: Negative for abdominal pain and vomiting.  Genitourinary: Negative for dysuria and hematuria.  Musculoskeletal: Negative for arthralgias and back pain.  Skin: Negative for color change and rash.  Neurological: Positive for light-headedness. Negative for seizures and syncope.  All other systems reviewed and are negative.    Physical Exam Updated Vital Signs BP 109/86   Pulse 75   Temp 98.9 F (37.2 C) (Oral)   Resp 14   SpO2 99%   Physical Exam  Constitutional: He appears well-developed and well-nourished.  HENT:  Head: Normocephalic and atraumatic.  Eyes: Conjunctivae are normal.  Neck: Neck supple.  Cardiovascular: Normal rate and regular rhythm.  No murmur heard. Pulmonary/Chest: Effort normal and breath sounds normal. No respiratory distress.  Abdominal: Soft. There is no tenderness.  Musculoskeletal: He exhibits no edema.  Neurological: He is alert.  Skin: Skin is warm and dry. Capillary refill takes less than 2 seconds.  Psychiatric: He has a normal mood and affect.  Nursing note and vitals reviewed.    ED Treatments / Results  Labs (all labs ordered are listed, but only abnormal results are displayed) Labs Reviewed  BASIC  METABOLIC PANEL - Abnormal; Notable for the following components:      Result Value   Glucose, Bld 167 (*)    BUN 22 (*)    All other components within normal limits  CBC - Abnormal; Notable for the following components:   WBC 11.0 (*)    All other components within normal limits    EKG EKG Interpretation  Date/Time:  Friday November 03 2017 16:34:40 EDT Ventricular Rate:  93 PR Interval:  164 QRS Duration: 84 QT Interval:  354 QTC Calculation: 440 R Axis:   139 Text Interpretation:  Normal sinus rhythm Right axis deviation No significant change since last tracing Confirmed by Gwyneth Sprout ([id number]) on 11/03/2017 5:11:26 PM   Radiology Dg Chest 2 View  Result Date: 11/03/2017 CLINICAL DATA:  Atrial fibrillation, dizziness today. EXAM: CHEST - 2 VIEW COMPARISON:  Chest x-rays dated 08/04/2015 and 11/29/2011. FINDINGS: Cardiomegaly is grossly stable. Lungs are clear. No pleural  effusion or pneumothorax seen. Osseous structures about the chest are unremarkable. IMPRESSION: 1. No active cardiopulmonary disease. No evidence of pneumonia or pulmonary edema. 2. Stable mild cardiomegaly. Electronically Signed   By: Bary Richard M.D.   On: 11/03/2017 17:08    Procedures Procedures (including critical care time)  Medications Ordered in ED Medications - No data to display   Initial Impression / Assessment and Plan / ED Course  I have reviewed the triage vital signs and the nursing notes.  Pertinent labs & imaging results that were available during my care of the patient were reviewed by me and considered in my medical decision making (see chart for details).     52 year old male with PMH stick for A. fib on Eliquis, CHF, T2DM, HTN and HLD who presents with chief complaint of A. fib and dizziness.  Patient with likely paroxysmal A. fib.  Doubt ACS due to absence of chest pain, history of paroxysmal A. fib, currently asymptomatic on evaluation.  Initial evaluation patient  asymptomatic.  Patient in normal sinus rhythm.  Labs and imaging negative.  Patient discharged at baseline.  Advise follow-up with cardiologist for continued management of paroxysmal A. fib.  Patient seen just with my attending Dr. Anitra Lauth who agrees with plan and disposition.    Final Clinical Impressions(s) / ED Diagnoses   Final diagnoses:  Paroxysmal atrial fibrillation [facility])    ED Discharge Orders    None       Margit Banda, MD 11/04/17 [id number]    Gwyneth Sprout, MD 11/05/17 2003

## 2017-11-03 NOTE — Discharge Instructions (Signed)
Return to the hospital if you start having strange sensations and elevated heart rate again.  Also return if you have any shortness of breath or chest pain

## 2017-11-03 NOTE — Telephone Encounter (Signed)
EKG obtained showing HR of 140 and Afib with RVR.  Showed to Dr. Clifton James, DOD.  He said pt needs to go to ER since he is symptomatic and rate is so high.  Advised pt of recommendations.  Pt agreeable to go to ER but states that he is going home to take a shower first.  Advised pt of importance of getting to the hospital.  Pt states he will go.

## 2017-11-03 NOTE — ED Notes (Signed)
ED Provider at bedside. 

## 2017-11-03 NOTE — Telephone Encounter (Signed)
° ° ° °  1) What is your heart rate? 140  2) Do you have a log of your heart rate readings (document readings)? 118, 113  3) Do you have any other symptoms? Dizziness, lightheaded  The patient's employer is faxing over EKG from today.

## 2017-11-03 NOTE — ED Triage Notes (Signed)
Pt presents for evaluation of afib, states got dizzy when he bent over earlier today. Hx of afib, on elliquis.

## 2017-11-03 NOTE — Telephone Encounter (Signed)
Spoke with pt and he states this morning he was dizzy and lightheaded.  Went to see nurse at work and HR was 118.  Went back to work and then returned to nurse because he was feeling worse.  She did an EKG and HR was 140.  Pt states they are faxing EKG over.  Advised once I received EKG I would speak to DOD and call pt back.

## 2017-11-05 NOTE — Telephone Encounter (Signed)
Start Multaq 400 mg BID 30 day monitor 2 D doppler echo EP consult once 30 day complete.

## 2017-11-06 MED ORDER — DRONEDARONE HCL 400 MG PO TABS: 400.0000 mg | ORAL_TABLET | Freq: Two times a day (BID) | ORAL | 3 refills | Status: DC

## 2017-11-06 NOTE — Telephone Encounter (Signed)
Spoke with pt and went over recommendations per Dr. Smith.  Pt verbalized understanding and was in agreement with this plan.   

## 2017-11-06 NOTE — Addendum Note (Signed)
Addended by: Julio Sicks on: 11/06/2017 06:28 PM   Modules accepted: Orders

## 2017-11-07 ENCOUNTER — Telehealth: Payer: Self-pay

## 2017-11-07 NOTE — Telephone Encounter (Signed)
Letter received via fax from [employer] stating that they have approved the pts Multaq PA. Approval good until 11/08/2018.    PA Case ID: [id number] - Rx #: [id number]  Outcome Approved today  Request Reference Number: [id number]. MULTAQ TAB 400MG  is approved through 11/08/2018.   I did call [facility] pharmacy and s/w Tiffany who confirmed that the PA was sucessful.

## 2017-11-07 NOTE — Telephone Encounter (Signed)
**Note De-Identified Cheyenna Pankowski Obfuscation** I have done a Multaq PA through covermymeds. Key: [id number]

## 2017-11-08 ENCOUNTER — Ambulatory Visit (INDEPENDENT_AMBULATORY_CARE_PROVIDER_SITE_OTHER): Payer: 59

## 2017-11-08 DIAGNOSIS — I4891 Unspecified atrial fibrillation: Secondary | ICD-10-CM

## 2017-11-10 ENCOUNTER — Telehealth: Payer: Self-pay | Admitting: Interventional Cardiology

## 2017-11-10 NOTE — Telephone Encounter (Signed)
New message   Patient would like a call to advise on a new medication that he is on. The patient does not know the name of the medication and states that the medication is too expensive. Please call the patient to discuss.

## 2017-11-10 NOTE — Telephone Encounter (Signed)
Spoke with pt and advised that I have a copay card that I can place at the front desk for him to pick up.  Pt will come by and get this.  Pt states Multaq will be $50/mo and he can't afford this.  Advised I will send message to my PA nurse and see if there is any kind of assistance for Multaq.  Pt appreciative for call.

## 2017-11-13 ENCOUNTER — Ambulatory Visit (HOSPITAL_COMMUNITY): Payer: 59 | Attending: Cardiovascular Disease

## 2017-11-13 ENCOUNTER — Other Ambulatory Visit: Payer: Self-pay

## 2017-11-13 DIAGNOSIS — I4891 Unspecified atrial fibrillation: Secondary | ICD-10-CM | POA: Diagnosis present

## 2017-11-14 ENCOUNTER — Telehealth: Payer: Self-pay

## 2017-11-14 NOTE — Telephone Encounter (Signed)
**Note De-Identified Audris Speaker Obfuscation** I called the pts pharmacy, [facility], and was advised that a 30 day supply of Multaq will cost the pt $787.09 for a 30 day supply and that aPA is not required as Multaq is not on the pts formulary for covered medications.  I have attempted a Multaq PA through covermymeds and am awaiting a determination or recommendations from his insurance on ways to lower the cost of Multaq going forward.

## 2017-11-15 ENCOUNTER — Telehealth: Payer: Self-pay | Admitting: Internal Medicine

## 2017-11-15 NOTE — Telephone Encounter (Signed)
Advised of  Echo result

## 2017-11-15 NOTE — Telephone Encounter (Signed)
New Message         Patient is calling today for lab results.

## 2017-11-21 ENCOUNTER — Telehealth: Payer: Self-pay | Admitting: *Deleted

## 2017-11-21 NOTE — Telephone Encounter (Signed)
New Message:    Please call asap, the pads form the monitor is irritating him so bad.This is the second type of pads he have tried.

## 2017-11-21 NOTE — Telephone Encounter (Signed)
**Note De-Identified Edilberto Roosevelt Obfuscation** Message received from covermymeds: [facility] Key: [id number] - PA Case ID: [id number]  Outcome  N/A on October 22  This medication or product was previously approved on [id number] From 11/07/2017 -To 11/08/2018. You will be able to fill a prescription for this medication at your pharmacy. If your pharmacy has questions regarding the processing of your prescription, please have them call the [facility] pharmacy help desk at [phone].  Will forward to Dr Katrinka Blazing and his nurse Victorino Dike as Lorain Childes.

## 2017-11-23 NOTE — Telephone Encounter (Signed)
Spoke with patient. Leaving a sample of Biotel Vermed hypoallergenic electrodes at front desk.  Contact message sent to [employer] to send another type of electrode patient and extend service for 3 days that patient has not been wearing monitor due to irritation.

## 2017-11-27 NOTE — Telephone Encounter (Signed)
Letter received from [employer] stating that they have approved the [place] PA. Approval good until 11/08/2018.   Case number:[id number]

## 2017-12-01 ENCOUNTER — Telehealth: Payer: Self-pay | Admitting: Interventional Cardiology

## 2017-12-01 NOTE — Telephone Encounter (Signed)
[employer] calling with critical  EKG

## 2017-12-01 NOTE — Telephone Encounter (Signed)
Greig Castilla with [employer] calling about a critical monitor. Patient having A. Flutter HR 120 with variable conduction on 12/01/17 at 10:41 am. Dr. Katrinka Blazing reviewed monitor, stated it shows; PAF, which is not new for patient. Patient was called, patient stated he is not wearing monitor all the time and is having issues with it staying on. Asked patient to contact [employer] for help. Patient verbalized understanding and agreed to try to keep wearing the monitor.

## 2017-12-08 ENCOUNTER — Other Ambulatory Visit: Payer: Self-pay | Admitting: Interventional Cardiology

## 2017-12-08 NOTE — Telephone Encounter (Signed)
Pt is 52 yr old male who saw Dr.Smith on 11/02/17, weight was 135.1Kg.  Last SCr on 11/03/17 was 0.98. Will refill Eliquis 5mg  BID.

## 2017-12-18 ENCOUNTER — Encounter: Payer: Self-pay | Admitting: Internal Medicine

## 2017-12-18 ENCOUNTER — Ambulatory Visit: Payer: 59 | Admitting: Internal Medicine

## 2017-12-18 VITALS — BP 100/60 | HR 68 | Ht 68.0 in | Wt 297.0 lb

## 2017-12-18 DIAGNOSIS — I480 Paroxysmal atrial fibrillation: Secondary | ICD-10-CM | POA: Diagnosis not present

## 2017-12-18 DIAGNOSIS — G4730 Sleep apnea, unspecified: Secondary | ICD-10-CM

## 2017-12-18 NOTE — Patient Instructions (Addendum)
Medication Instructions:  °Your physician recommends that you continue on your current medications as directed. Please refer to the Current Medication list given to you today. ° °Labwork: °None ordered. ° °Testing/Procedures: °None ordered. ° °Follow-Up: °Your physician wants you to follow-up in: 3 months with AFIB clinic. ° °Any Other Special Instructions Will Be Listed Below (If Applicable). ° °If you need a refill on your cardiac medications before your next appointment, please call your pharmacy.  ° ° ° ° °

## 2017-12-18 NOTE — Progress Notes (Signed)
Electrophysiology Office Note   Date:  12/18/2017   ID:  JAKI DUDAK, DOB 08-Feb-1965, MRN [id number]  PCP:  Patient, No Pcp Per  Cardiologist:  Dr Katrinka Blazing Primary Electrophysiologist: Hillis Range, MD    CC: afib   History of Present Illness: Juan Schwartz is a 52 y.o. male who presents today for electrophysiology evaluation.   He is referred by Dr Katrinka Blazing for EP consultation regarding paroxysmal atrial fibrillation.  He reports having atrial fibrillation for several years.  Episodes typically come and go on their own.  He has required cardioversion previously.  He is mostly unaware of his afib.  During afib, V rates are at times elevated.  He is very active.  He has lost over 100 lbs with lifestyle modification!    Today, he denies symptoms of palpitations, chest pain, shortness of breath, orthopnea, PND, lower extremity edema, claudication, dizziness, presyncope, syncope, bleeding, or neurologic sequela. The patient is tolerating medications without difficulties and is otherwise without complaint today.    Past Medical History:  Diagnosis Date  . Allergy   . Atrial fibrillation (HCC) 08/04/2015   CHA2DS2-VASc = 3  . CHF (congestive heart failure) (HCC) 2004   one episode  . Dehydration 08/04/2015  . Diabetes mellitus, type 2 (HCC) 11/29/2011  . Dyslipidemia 08/04/2015  . Essential hypertension 08/04/2015  . Hx of sleep apnea   . Hyperlipidemia   . Hypertension   . Normal coronary arteries 2004  . Obesity 11/29/2011  . Sleep apnea-untreated 12/16/2011   Pt denies having OSA when asked today   Past Surgical History:  Procedure Laterality Date  . CARDIAC CATHETERIZATION  2005  . CARDIOVERSION N/A 08/05/2015   Procedure: CARDIOVERSION;  Surgeon: Jake Bathe, MD;  Location: [facility] ENDOSCOPY;  Service: Cardiovascular;  Laterality: N/A;  . CARDIOVERSION N/A 07/19/2017   Procedure: CARDIOVERSION;  Surgeon: Vesta Mixer, MD;  Location: [facility] ENDOSCOPY;  Service:  Cardiovascular;  Laterality: N/A;  . HERNIA REPAIR    . TEE WITHOUT CARDIOVERSION N/A 08/05/2015   Procedure: TRANSESOPHAGEAL ECHOCARDIOGRAM (TEE);  Surgeon: Jake Bathe, MD;  Location: [facility] ENDOSCOPY;  Service: Cardiovascular;  Laterality: N/A;     Current Outpatient Medications  Medication Sig Dispense Refill  . acetaminophen (TYLENOL) 500 MG tablet Take 500 mg by mouth every 6 (six) hours as needed for moderate pain or headache.    . dronedarone (MULTAQ) 400 MG tablet Take 1 tablet (400 mg total) by mouth 2 (two) times daily with a meal. 60 tablet 3  . ELIQUIS 5 MG TABS tablet TAKE ONE TABLET BY MOUTH TWICE DAILY 180 tablet 1  . ELIQUIS 5 MG TABS tablet TAKE 1 TABLET BY MOUTH TWICE DAILY 60 tablet 10  . glipiZIDE (GLUCOTROL XL) 10 MG 24 hr tablet Take 1 tablet (10 mg total) by mouth 2 (two) times daily. 60 tablet 3  . ipratropium (ATROVENT) 0.03 % nasal spray Place 2 sprays into the nose 4 (four) times daily. 30 mL 6  . lisinopril (PRINIVIL,ZESTRIL) 2.5 MG tablet Take 2.5 mg by mouth daily.    . metFORMIN (GLUCOPHAGE) 1000 MG tablet Take 1 tablet (1,000 mg total) by mouth 2 (two) times daily with a meal. 180 tablet 3  . metoprolol succinate (TOPROL-XL) 25 MG 24 hr tablet Take 1 tablet (25 mg total) by mouth 2 (two) times daily. 180 tablet 3  . Multiple Vitamins-Minerals (MEGA MULTI MEN) TABS Take 1 tablet by mouth daily.    . sildenafil (VIAGRA) 100 MG tablet  Take 0.5-1 tablets (50-100 mg total) by mouth daily as needed for erectile dysfunction. 5 tablet 11  . JARDIANCE 25 MG TABS tablet Take 25 mg by mouth daily.  6   No current facility-administered medications for this visit.     Allergies:   Patient has no known allergies.   Social History:  The patient  reports that he has quit smoking. He has never used smokeless tobacco. He reports that he drinks alcohol. He reports that he does not use drugs.   Family History:  The patient's  family history includes Diabetes in his mother.     ROS:  Please see the history of present illness.   All other systems are personally reviewed and negative.    PHYSICAL EXAM: VS:  BP 100/60   Pulse 68   Ht 5\' 8"  (1.727 m)   Wt 297 lb (134.7 kg)   SpO2 97%   BMI 45.16 kg/m  , BMI Body mass index is 45.16 kg/m. GEN: Well nourished, well developed, in no acute distress  HEENT: normal  Neck: no JVD, carotid bruits, or masses Cardiac: RRR; no murmurs, rubs, or gallops,no edema  Respiratory:  clear to auscultation bilaterally, normal work of breathing GI: soft, nontender, nondistended, + BS MS: no deformity or atrophy  Skin: warm and dry  Neuro:  Strength and sensation are intact Psych: euthymic mood, full affect  EKG:  EKG is ordered today. The ekg ordered today is personally reviewed and shows sinus rhythm 68 bpm, PR 190 msec, QRS 102 msec, Qtc 435 msec   Recent Labs: 11/03/2017: BUN 22; Creatinine, Ser 0.98; Hemoglobin 14.5; Platelets 179; Potassium 3.9; Sodium 139  personally reviewed   Lipid Panel     Component Value Date/Time   CHOL 182 08/25/2012 0818   TRIG 193 (H) 08/25/2012 0818   HDL 48 08/25/2012 0818   CHOLHDL 3.8 08/25/2012 0818   VLDL 39 08/25/2012 0818   LDLCALC 95 08/25/2012 0818   personally reviewed   Wt Readings from Last 3 Encounters:  12/18/17 297 lb (134.7 kg)  11/02/17 297 lb 12.8 oz (135.1 kg)  07/31/17 289 lb (131.1 kg)     Other studies personally reviewed: Additional studies/ records that were reviewed today include: echo 10/25/17  Review of the above records today demonstrates: EF 60%, LA severely enlarged (volume 99 ml) Event monitor is reviewed also   ASSESSMENT AND PLAN:  1.  Paroxysmal atrial fibrillation The patient has symptomatic afib.  Despite severe LA enlargement, he has actually done reasonably well.  I am convinced that his aggressive lifestyle change with reduction of weight by over 100 lbs has been the reason that he has done so well. He has minimal symptoms with  his afib.  He is on multaq.  Chads2vasc score is 2.  he is anticoagulated with eliquis . Therapeutic strategies for afib including medicine and ablation were discussed in detail with the patient today.  We discussed flecainide, tikosyn, and ablation as options. At this time, he would prefer to continue multaq.  He feels that his AF is reasonably well controlled. Given his severe LA enlargement, I am not certain that we can further improve his current AF burden.  No changes today  2. Obesity Body mass index is 45.16 kg/m. We discussed lifestyle modification at length today  3. OSA He denies that he has sleep apnea.  Follow-up:  AF clinic every 3 months and with Dr Katrinka Blazing I will see when needed  Current medicines are reviewed  at length with the patient today.   The patient does not have concerns regarding his medicines.  The following changes were made today:  none  Labs/ tests ordered today include:  Orders Placed This Encounter  Procedures  . EKG 12-Lead     Signed, Hillis Range, MD  12/18/2017 9:32 AM     [facility] [address] [place] [address] [phone] (office) [phone] (fax)

## 2018-01-27 ENCOUNTER — Emergency Department (HOSPITAL_COMMUNITY): Payer: 59

## 2018-01-27 ENCOUNTER — Other Ambulatory Visit: Payer: Self-pay

## 2018-01-27 ENCOUNTER — Encounter (HOSPITAL_COMMUNITY): Payer: Self-pay

## 2018-01-27 ENCOUNTER — Emergency Department (HOSPITAL_COMMUNITY)
Admission: EM | Admit: 2018-01-27 | Discharge: 2018-01-27 | Disposition: A | Discharge status: Home / Self Care | Payer: 59 | Attending: Emergency Medicine | Admitting: Emergency Medicine

## 2018-01-27 DIAGNOSIS — Z79899 Other long term (current) drug therapy: Secondary | ICD-10-CM | POA: Diagnosis not present

## 2018-01-27 DIAGNOSIS — Z87891 Personal history of nicotine dependence: Secondary | ICD-10-CM | POA: Diagnosis not present

## 2018-01-27 DIAGNOSIS — I509 Heart failure, unspecified: Secondary | ICD-10-CM | POA: Diagnosis not present

## 2018-01-27 DIAGNOSIS — R42 Dizziness and giddiness: Secondary | ICD-10-CM | POA: Diagnosis present

## 2018-01-27 DIAGNOSIS — I110 Hypertensive heart disease with heart failure: Secondary | ICD-10-CM | POA: Insufficient documentation

## 2018-01-27 DIAGNOSIS — I480 Paroxysmal atrial fibrillation: Secondary | ICD-10-CM | POA: Insufficient documentation

## 2018-01-27 DIAGNOSIS — E119 Type 2 diabetes mellitus without complications: Secondary | ICD-10-CM | POA: Insufficient documentation

## 2018-01-27 DIAGNOSIS — Z7984 Long term (current) use of oral hypoglycemic drugs: Secondary | ICD-10-CM | POA: Diagnosis not present

## 2018-01-27 LAB — BASIC METABOLIC PANEL
Anion gap: 8 (ref 5–15)
BUN: 15 mg/dL (ref 6–20)
CO2: 26 mmol/L (ref 22–32)
Calcium: 9.9 mg/dL (ref 8.9–10.3)
Chloride: 104 mmol/L (ref 98–111)
Creatinine, Ser: 0.91 mg/dL (ref 0.61–1.24)
GFR calc Af Amer: >60 mL/min (ref >60)
GFR calc non Af Amer: >60 mL/min (ref >60)
Glucose, Bld: 141 mg/dL — ABNORMAL HIGH (ref 70–99)
POTASSIUM: 4.6 mmol/L (ref 3.5–5.1)
Sodium: 138 mmol/L (ref 135–145)

## 2018-01-27 LAB — CBC
HCT: 45.9 % (ref 39.0–52.0)
Hemoglobin: 14.5 g/dL (ref 13.0–17.0)
MCH: 29.5 pg (ref 26.0–34.0)
MCHC: 31.6 g/dL (ref 30.0–36.0)
MCV: 93.5 fL (ref 80.0–100.0)
Platelets: 167 10*3/uL (ref 150–400)
RBC: 4.91 MIL/uL (ref 4.22–5.81)
RDW: 14.2 % (ref 11.5–15.5)
WBC: 9.4 10*3/uL (ref 4.0–10.5)
nRBC: 0 % (ref 0.0–0.2)

## 2018-01-27 MED ORDER — METOPROLOL TARTRATE 5 MG/5ML IV SOLN: 5.0000 mg | Freq: Once | INTRAVENOUS | Status: DC

## 2018-01-27 MED ORDER — SODIUM CHLORIDE 0.9 % IV BOLUS: 1000.0000 mL | Freq: Once | INTRAVENOUS | Status: DC

## 2018-01-27 MED ORDER — METOPROLOL TARTRATE 25 MG PO TABS: 25.0000 mg | ORAL_TABLET | Freq: Once | ORAL | Status: DC

## 2018-01-27 NOTE — ED Triage Notes (Signed)
Pt presents to ED for eval of afib and dizziness on standing. Pt states his cardiologist told him to be evaluated if he was in afib and their office is closed. Pt denies CP, SOB, NV. Pt states his only symptom is dizziness on standing. Pt skin warm and dry. Pt A+Ox4, states has hx of afib, states he takes metoprolol and eliquis.

## 2018-01-27 NOTE — ED Notes (Signed)
Patient verbalizes understanding of discharge instructions. Opportunity for questioning and answers were provided. Armband removed by staff, pt discharged from ED ambulatory to home.  

## 2018-01-27 NOTE — Discharge Instructions (Signed)
Please continue your current medications including your metoprolol 25 mg twice daily, eliquis and multaq. You may also take 1 extra 25 mg metoprolol as needed for increased heart rate.  Please follow up with your cardiologist.    Return if you have worsening palpitations, shortness of breath or chest pain.

## 2018-01-27 NOTE — ED Provider Notes (Signed)
[facility] EMERGENCY DEPARTMENT Provider Note   CSN: [id number] Arrival date & time: 01/27/18  1327   History   Chief Complaint Chief Complaint  Patient presents with  . Atrial Fibrillation    HPI Juan Schwartz is a 53 y.o. male with PMH of atrial fibrillation on Eliquis, CHF, T2DM, hypertension and hyperlipidemia who presents with a chief complaint of concern for atrial fibrillation and dizziness.  Patient reports that yesterday afternoon he felt dizzy upon standing.  Reports that this continued into today so he came into the emergency room in order to determine if he is in atrial fibrillation.  He also states that he felt some palpitation.  Patient has previously had symptoms of lightheadedness upon standing when having episodes of atrial fibrillation. He is on Multaq, metoprolol and Eliquis at home and reports that he has not missed any doses. Multaq is new to him 2 months ago.   Does report that he has been drinking 7-8, 12oz beers per day, but has only had 1 beer in the past 48 hours.   Patient denies any chest pain, SOB, orthopnea, abdominal pain or recent infectious sx.   The history is provided by the patient and the spouse.    Past Medical History:  Diagnosis Date  . Allergy   . Atrial fibrillation (HCC) 08/04/2015   CHA2DS2-VASc = 3  . CHF (congestive heart failure) (HCC) 2004   one episode  . Dehydration 08/04/2015  . Diabetes mellitus, type 2 (HCC) 11/29/2011  . Dyslipidemia 08/04/2015  . Essential hypertension 08/04/2015  . Hx of sleep apnea   . Hyperlipidemia   . Hypertension   . Normal coronary arteries 2004  . Obesity 11/29/2011  . Sleep apnea-untreated 12/16/2011   Pt denies having OSA when asked today    Patient Active Problem List   Diagnosis Date Noted  . Atrial fibrillation, new onset (HCC) 08/04/2015  . Normal coronary arteries-2004 08/04/2015  . Essential hypertension 08/04/2015  . Dyslipidemia 08/04/2015  . Sleep  apnea-untreated 12/16/2011  . Obesity 11/29/2011  . Diabetes mellitus, type 2 (HCC) 11/29/2011    Past Surgical History:  Procedure Laterality Date  . CARDIAC CATHETERIZATION  2005  . CARDIOVERSION N/A 08/05/2015   Procedure: CARDIOVERSION;  Surgeon: Jake Bathe, MD;  Location: [facility] ENDOSCOPY;  Service: Cardiovascular;  Laterality: N/A;  . CARDIOVERSION N/A 07/19/2017   Procedure: CARDIOVERSION;  Surgeon: Vesta Mixer, MD;  Location: [facility] ENDOSCOPY;  Service: Cardiovascular;  Laterality: N/A;  . HERNIA REPAIR    . TEE WITHOUT CARDIOVERSION N/A 08/05/2015   Procedure: TRANSESOPHAGEAL ECHOCARDIOGRAM (TEE);  Surgeon: Jake Bathe, MD;  Location: [facility] ENDOSCOPY;  Service: Cardiovascular;  Laterality: N/A;        Home Medications    Prior to Admission medications   Medication Sig Start Date End Date Taking? Authorizing Provider  acetaminophen (TYLENOL) 500 MG tablet Take 500 mg by mouth every 6 (six) hours as needed for moderate pain or headache.    [provider]  dronedarone (MULTAQ) 400 MG tablet Take 1 tablet (400 mg total) by mouth 2 (two) times daily with a meal. 11/06/17   Lyn Records, MD  ELIQUIS 5 MG TABS tablet TAKE ONE TABLET BY MOUTH TWICE DAILY 12/26/16   Lyn Records, MD  ELIQUIS 5 MG TABS tablet TAKE 1 TABLET BY MOUTH TWICE DAILY 12/08/17   Lyn Records, MD  glipiZIDE (GLUCOTROL XL) 10 MG 24 hr tablet Take 1 tablet (10 mg total) by  mouth 2 (two) times daily. 08/27/12   Collene Gobble, MD  ipratropium (ATROVENT) 0.03 % nasal spray Place 2 sprays into the nose 4 (four) times daily. 09/11/12   Copland, Gwenlyn Found, MD  JARDIANCE 25 MG TABS tablet Take 25 mg by mouth daily. 11/26/17   [provider]  lisinopril (PRINIVIL,ZESTRIL) 2.5 MG tablet Take 2.5 mg by mouth daily.    [provider]  metFORMIN (GLUCOPHAGE) 1000 MG tablet Take 1 tablet (1,000 mg total) by mouth 2 (two) times daily with a meal. 08/25/12   Daub, Maylon Peppers, MD  metoprolol succinate  (TOPROL-XL) 25 MG 24 hr tablet Take 1 tablet (25 mg total) by mouth 2 (two) times daily. 11/11/16   Lyn Records, MD  Multiple Vitamins-Minerals (MEGA MULTI MEN) TABS Take 1 tablet by mouth daily.    [provider]  sildenafil (VIAGRA) 100 MG tablet Take 0.5-1 tablets (50-100 mg total) by mouth daily as needed for erectile dysfunction. 03/03/12   Collene Gobble, MD    Family History Family History  Problem Relation Age of Onset  . Diabetes Mother     Social History Social History   Tobacco Use  . Smoking status: Former Games developer  . Smokeless tobacco: Never Used  Substance Use Topics  . Alcohol use: Yes    Comment: occ  . Drug use: No     Allergies   Patient has no known allergies.   Review of Systems Review of Systems  Respiratory: Negative for chest tightness and shortness of breath.   Cardiovascular: Negative for chest pain, palpitations and leg swelling.  Gastrointestinal: Negative for constipation, diarrhea, nausea and vomiting.  Neurological: Positive for dizziness and tremors. Negative for headaches.  All other systems reviewed and are negative.    Physical Exam Updated Vital Signs BP 107/73 (BP Location: Right Arm)   Pulse 73   Temp 98.1 F (36.7 C) (Oral)   Resp 18   SpO2 99%   Physical Exam Vitals signs and nursing note reviewed.  Constitutional:      General: He is not in acute distress.    Appearance: Normal appearance. He is obese.  HENT:     Head: Normocephalic and atraumatic.     Nose: Nose normal.     Mouth/Throat:     Mouth: Mucous membranes are moist.     Pharynx: Oropharynx is clear.  Eyes:     Conjunctiva/sclera: Conjunctivae normal.     Pupils: Pupils are equal, round, and reactive to light.  Neck:     Musculoskeletal: Normal range of motion and neck supple.  Cardiovascular:     Rate and Rhythm: Tachycardia present. Rhythm irregular.     Pulses: Normal pulses.     Heart sounds: No murmur.  Pulmonary:     Effort: Pulmonary  effort is normal.     Breath sounds: Normal breath sounds.  Abdominal:     General: Bowel sounds are normal.     Palpations: Abdomen is soft.  Musculoskeletal: Normal range of motion.  Neurological:     General: No focal deficit present.     Mental Status: He is alert and oriented to person, place, and time.     Cranial Nerves: No cranial nerve deficit.     Motor: No weakness.  Psychiatric:        Mood and Affect: Mood normal.        Thought Content: Thought content normal.    ED Treatments / Results  Labs (all labs  ordered are listed, but only abnormal results are displayed) Labs Reviewed  BASIC METABOLIC PANEL - Abnormal; Notable for the following components:      Result Value   Glucose, Bld 141 (*)    All other components within normal limits  CBC    EKG EKG Interpretation  Date/Time:  Saturday January 27 2018 13:42:44 EST Ventricular Rate:  114 PR Interval:    QRS Duration: 76 QT Interval:  320 QTC Calculation: 441 R Axis:   152 Text Interpretation:  Atrial fibrillation with rapid ventricular response with premature ventricular or aberrantly conducted complexes Right axis deviation Anterior infarct , age undetermined Abnormal ECG previous EKG showed sinus  Confirmed by Richardean Canal [phone]) on 01/27/2018 4:25:48 PM   Radiology Dg Chest 2 View  Result Date: 01/27/2018 CLINICAL DATA:  Dizziness while standing today. Pt states he thinks he is in A-fib. Currently denies pain, SOB and weakness.Hx of HTN, DM.Ex-smoker, quit x13 years ago. EXAM: CHEST - 2 VIEW COMPARISON:  None. FINDINGS: Cardiac silhouette is normal in size. No mediastinal or hilar masses. No evidence of adenopathy. There are prominent bronchovascular markings bilaterally, stable. Lungs otherwise clear. No pleural effusion or pneumothorax. Skeletal structures are unremarkable. IMPRESSION: No active cardiopulmonary disease. Electronically Signed   By: Amie Portland M.D.   On: 01/27/2018 14:19     Procedures Procedures (including critical care time)  Medications Ordered in ED Medications  sodium chloride 0.9 % bolus 1,000 mL (1,000 mLs Intravenous Not Given 01/27/18 1839)  metoprolol tartrate (LOPRESSOR) injection 5 mg (5 mg Intravenous Not Given 01/27/18 1839)     Initial Impression / Assessment and Plan / ED Course  I have reviewed the triage vital signs and the nursing notes.  Pertinent labs & imaging results that were available during my care of the patient were reviewed by me and considered in my medical decision making (see chart for details).   This patients CHA2DS2-VASc Score is 3 (HTN, DM, CHF).   Patient given extra dose of metoprolol 25mg  orally from home medications and monitored after taking pill.   After extra metoprolol 25mg , patient remained around ~100 bpm in A Fib. Patient is stable with no chest pain. He is refusing any fluid bolus, or possible cardioversion if his heart rate were to increase. States he would like to go home and follow up with his cardiologist next week.   Vitals are stable and patient otherwise well- appearing. Counseled on taking metoprolol BID, continuing eliquis and multaq. He may take an extra metoprolol prn for palpitations and is to see his cardiologist as soon as he is able.  Also counseled on decreasing alcohol intake. Given return precautions and patient voiced understanding.   [place] Mervil Wacker, DO PGY-2, [facility]    Final Clinical Impressions(s) / ED Diagnoses   Final diagnoses:  Paroxysmal atrial fibrillation [facility])    ED Discharge Orders    None       Kaeya Schiffer, [place], DO 01/27/18 1840    Charlynne Pander, MD 01/27/18 [phone]

## 2018-01-30 ENCOUNTER — Telehealth (HOSPITAL_COMMUNITY): Payer: Self-pay | Admitting: *Deleted

## 2018-01-30 NOTE — Telephone Encounter (Signed)
I cld and left a msg for pt to clbk to sched follow up appt.  Pt was seen in ED in afib and refused dccv or bolus.  Pt due to follow up within 1 week.

## 2018-01-31 ENCOUNTER — Encounter (HOSPITAL_COMMUNITY): Payer: Self-pay | Admitting: Nurse Practitioner

## 2018-01-31 ENCOUNTER — Ambulatory Visit (HOSPITAL_COMMUNITY)
Admission: RE | Admit: 2018-01-31 | Discharge: 2018-01-31 | Disposition: A | Discharge status: Home / Self Care | Payer: 59 | Source: Ambulatory Visit | Attending: Nurse Practitioner | Admitting: Nurse Practitioner

## 2018-01-31 VITALS — BP 108/64 | HR 64 | Ht 68.0 in | Wt 300.0 lb

## 2018-01-31 DIAGNOSIS — I110 Hypertensive heart disease with heart failure: Secondary | ICD-10-CM | POA: Diagnosis not present

## 2018-01-31 DIAGNOSIS — Z6841 Body Mass Index (BMI) 40.0 and over, adult: Secondary | ICD-10-CM | POA: Insufficient documentation

## 2018-01-31 DIAGNOSIS — Z79899 Other long term (current) drug therapy: Secondary | ICD-10-CM | POA: Diagnosis not present

## 2018-01-31 DIAGNOSIS — Z7901 Long term (current) use of anticoagulants: Secondary | ICD-10-CM | POA: Diagnosis not present

## 2018-01-31 DIAGNOSIS — I4891 Unspecified atrial fibrillation: Secondary | ICD-10-CM | POA: Insufficient documentation

## 2018-01-31 DIAGNOSIS — Z7984 Long term (current) use of oral hypoglycemic drugs: Secondary | ICD-10-CM | POA: Diagnosis not present

## 2018-01-31 DIAGNOSIS — I509 Heart failure, unspecified: Secondary | ICD-10-CM | POA: Insufficient documentation

## 2018-01-31 DIAGNOSIS — Z87891 Personal history of nicotine dependence: Secondary | ICD-10-CM | POA: Insufficient documentation

## 2018-01-31 DIAGNOSIS — E669 Obesity, unspecified: Secondary | ICD-10-CM | POA: Diagnosis not present

## 2018-01-31 DIAGNOSIS — G4730 Sleep apnea, unspecified: Secondary | ICD-10-CM | POA: Diagnosis not present

## 2018-01-31 DIAGNOSIS — E119 Type 2 diabetes mellitus without complications: Secondary | ICD-10-CM | POA: Insufficient documentation

## 2018-01-31 DIAGNOSIS — I480 Paroxysmal atrial fibrillation: Secondary | ICD-10-CM

## 2018-01-31 DIAGNOSIS — Z833 Family history of diabetes mellitus: Secondary | ICD-10-CM | POA: Diagnosis not present

## 2018-01-31 NOTE — Progress Notes (Signed)
Patient ID: Juan Schwartz, male   DOB: [birth year], 53 y.o.   MRN: [id number]     Primary Care Physician: Patient, No Pcp Per Referring Physician: [facility] F/U Cardiologist: Dr. Lionel December Juan Schwartz is a 53 y.o. AA male with a h/o obesity, HTN, untreated sleep apnea, daily ETOH ("2 beers a day"), HLD, and NIDDM. He was seen by cardiology in 2004 when he presented with chest pain and SOB. An echo initially showed an EF of 35%. He was cathed by Dr Clarene Duke and had normal coronaries and an EF of 50%. It was felt he had an episode of CHF then but he has not had any further episodes.   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 He began to get weak and decided to stop for the day. The morning of admit 08/04/15 he went to work (works outside) and as soon as it started to warm up he again felt weak. He denied any chest pain or dyspnea. He denied any palpations. In the ED his EKG shows AF with RVR @ 126, poor ant RW. His B/P is a little low and his BUN was 24 suggesting he is mildly dehydrated. He was admitted and hydrated, IV heparin and dilt. It was noted he had sleep apnea and will need out pt sleep study.   His cardiac enzymes were negative. His TSH was normal. K+and Mg+ normal. He was eventually place on IV amiodarone and underwent TEE and DCCV on 08/05/15. EF was 45% with diffuse hypokinesis Tr MR. DCCV was successful and pt has maintained SR since.   He was seen by Dr. Katrinka Blazing and amiodarone was stopped. Metoprolol was added. His glucose off Metformin was elevated during hospitaltiztion.   Eliquis  added for 4 weeks and no asprin for that time. Plan for out pt sleep study.  F/u in afib clinic, 08/12/16. Pt states that he feels well. No return of afib. He returns to work next Tuesday and he does work outside in the heat. BP soft today at 98/64. He states that BP has been lower since metoprolol was added. He is not symptomatic with this currently. He does have a sleep  study pending. He is being compliant with eliquis and with a a chadsvasc score of at least 2(HYN, DM), it is felt that he should stay on anticoagulant.He has lost 150 lbs over the last couple of years, at one point weighting 468 lbs. He has plateaued with his weight  and plans to return to the gym and restart his walking 1-2 miles 2x a day. We discussed alcohol and afib connection and he has quit alcohol since hospitalization and plans  not drink alcohol going forward.   F/u in afib clinic 05/05/16.He recently was seen by [name], [name], for f/u and reported that he had not felt well recentlyy with more dizziness, fatigue.He was found to be in afib. It was recommended that he undergo a cpap titration trial due to return of afib but sleep study showed only mild sleep apnea last year. Pt states today that he does not want to pursue this. He states that it is all his fault that he went into afib as he had been non compliant with his meds. He was only taking BB occassionally and would often skip his anticoagulant.  He has increased his alcohol intake.He said before he did well with he followed the plan without afib.  F/u ina fib clinic for recent ER visit 01/31/2018.  He had been drinking a lot of beer over the holidays and had return of afib. He was rate controlled at time of ER visit and d/c home. Now in afib clinic he is back in SR. He converted about 2 hours after returning home from the ER. The ER MD suggested to go up on daily BB but this made him feel lightheaded. He has not had anymore alcohol and is writing this off. He continues on Multaq. He feels the holiday alcohol use was his trigger for recent afib episode.   Today, he denies symptoms of palpitations, chest pain, shortness of breath, orthopnea, PND, lower extremity edema, dizziness, presyncope, syncope, or neurologic sequela. The patient is tolerating medications without difficulties and is otherwise without complaint today.   Past Medical  History:  Diagnosis Date  . Allergy   . Atrial fibrillation (HCC) 08/04/2015   CHA2DS2-VASc = 3  . CHF (congestive heart failure) (HCC) 2004   one episode  . Dehydration 08/04/2015  . Diabetes mellitus, type 2 (HCC) 11/29/2011  . Dyslipidemia 08/04/2015  . Essential hypertension 08/04/2015  . Hx of sleep apnea   . Hyperlipidemia   . Hypertension   . Normal coronary arteries 2004  . Obesity 11/29/2011  . Sleep apnea-untreated 12/16/2011   Pt denies having OSA when asked today   Past Surgical History:  Procedure Laterality Date  . CARDIAC CATHETERIZATION  2005  . CARDIOVERSION N/A 08/05/2015   Procedure: CARDIOVERSION;  Surgeon: Jake Bathe, MD;  Location: [facility] ENDOSCOPY;  Service: Cardiovascular;  Laterality: N/A;  . CARDIOVERSION N/A 07/19/2017   Procedure: CARDIOVERSION;  Surgeon: Vesta Mixer, MD;  Location: [facility] ENDOSCOPY;  Service: Cardiovascular;  Laterality: N/A;  . HERNIA REPAIR    . TEE WITHOUT CARDIOVERSION N/A 08/05/2015   Procedure: TRANSESOPHAGEAL ECHOCARDIOGRAM (TEE);  Surgeon: Jake Bathe, MD;  Location: [facility] ENDOSCOPY;  Service: Cardiovascular;  Laterality: N/A;    Current Outpatient Medications  Medication Sig Dispense Refill  . acetaminophen (TYLENOL) 500 MG tablet Take 500 mg by mouth every 6 (six) hours as needed for moderate pain or headache.    . dronedarone (MULTAQ) 400 MG tablet Take 1 tablet (400 mg total) by mouth 2 (two) times daily with a meal. 60 tablet 3  . ELIQUIS 5 MG TABS tablet TAKE ONE TABLET BY MOUTH TWICE DAILY 180 tablet 1  . glipiZIDE (GLUCOTROL XL) 10 MG 24 hr tablet Take 1 tablet (10 mg total) by mouth 2 (two) times daily. 60 tablet 3  . ipratropium (ATROVENT) 0.03 % nasal spray Place 2 sprays into the nose 4 (four) times daily. 30 mL 6  . JARDIANCE 25 MG TABS tablet Take 25 mg by mouth daily.  6  . lisinopril (PRINIVIL,ZESTRIL) 2.5 MG tablet Take 2.5 mg by mouth daily.    . metFORMIN (GLUCOPHAGE) 1000 MG tablet Take 1 tablet (1,000 mg total)  by mouth 2 (two) times daily with a meal. 180 tablet 3  . metoprolol succinate (TOPROL-XL) 25 MG 24 hr tablet Take 1 tablet (25 mg total) by mouth 2 (two) times daily. 180 tablet 3  . Multiple Vitamins-Minerals (MEGA MULTI MEN) TABS Take 1 tablet by mouth daily.    . sildenafil (VIAGRA) 100 MG tablet Take 0.5-1 tablets (50-100 mg total) by mouth daily as needed for erectile dysfunction. 5 tablet 11   No current facility-administered medications for this encounter.     No Known Allergies  Social History   Socioeconomic History  . Marital status: Married  Spouse name: Not on file  . Number of children: Not on file  . Years of education: Not on file  . Highest education level: Not on file  Occupational History  . Not on file  Social Needs  . Financial resource strain: Not on file  . Food insecurity:    Worry: Not on file    Inability: Not on file  . Transportation needs:    Medical: Not on file    Non-medical: Not on file  Tobacco Use  . Smoking status: Former Games developer  . Smokeless tobacco: Never Used  Substance and Sexual Activity  . Alcohol use: Yes    Comment: occ  . Drug use: No  . Sexual activity: Yes  Lifestyle  . Physical activity:    Days per week: Not on file    Minutes per session: Not on file  . Stress: Not on file  Relationships  . Social connections:    Talks on phone: Not on file    Gets together: Not on file    Attends religious service: Not on file    Active member of club or organization: Not on file    Attends meetings of clubs or organizations: Not on file    Relationship status: Not on file  . Intimate partner violence:    Fear of current or ex partner: Not on file    Emotionally abused: Not on file    Physically abused: Not on file    Forced sexual activity: Not on file  Other Topics Concern  . 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    Family History  Problem  Relation Age of Onset  . Diabetes Mother     ROS- All systems are reviewed and negative except as per the HPI above  Physical Exam: Vitals:   01/31/18 1326  BP: 108/64  Pulse: 64  Weight: 136.1 kg  Height: 5\' 8"  (1.727 m)    GEN- The patient is well appearing, alert and oriented x 3 today.   Head- normocephalic, atraumatic Eyes-  Sclera clear, conjunctiva pink Ears- hearing intact Oropharynx- clear Neck- supple, no JVP Lymph- no cervical lymphadenopathy Lungs- Clear to ausculation bilaterally, normal work of breathing Heart- Regular rate and rhythm, no murmurs, rubs or gallops, PMI not laterally displaced GI- soft, NT, ND, + BS Extremities- no clubbing, cyanosis, or edema MS- no significant deformity or atrophy Skin- no rash or lesion Psych- euthymic mood, full affect Neuro- strength and sensation are intact  EKG-NSR, 64 bpm, Pr int 182 bpm, qrs int 88 ms, qtc 427 ms Epic records reviewed  Assessment and Plan: 1. NSR Recent return of afib 2/2 noncompliance with alcohol Re educated pt re afib triggers, states will avoid alcohol He states that he will comply with plan Continue with metoprolol bid with extra dose for breakthrough afib Continue multaq 400 mg bid Continue with apixaban bid long term with chdsvasc score of two(htn, DM)  2. Risk factor modification Weight loss encouraged Pt does not want to pursue cpap titration at this point Avoid alcohol  f/u with Dr. Katrinka Blazing as scheduled afib clinic as needed   Lupita Leash [facility] [address] [place], [place] [address] [phone]

## 2018-03-07 ENCOUNTER — Other Ambulatory Visit: Payer: Self-pay | Admitting: Interventional Cardiology

## 2018-03-21 ENCOUNTER — Ambulatory Visit (HOSPITAL_COMMUNITY): Payer: 59 | Admitting: Nurse Practitioner

## 2018-04-03 ENCOUNTER — Other Ambulatory Visit: Payer: Self-pay | Admitting: Interventional Cardiology

## 2018-04-23 ENCOUNTER — Telehealth: Payer: Self-pay | Admitting: Interventional Cardiology

## 2018-04-23 NOTE — Telephone Encounter (Signed)
Spoke with Dr. Katrinka Blazing and he said ok to work and he should practice social distancing.  Spoke with pt and made him aware.  Pt verbalized understanding.

## 2018-04-23 NOTE — Telephone Encounter (Signed)
New Message    Patient wants to know if it's okay for him to work outside. Please call patient.

## 2018-05-28 ENCOUNTER — Telehealth: Payer: Self-pay | Admitting: Physician Assistant

## 2018-05-28 NOTE — Telephone Encounter (Signed)
Spoke with patient who confirmed all demographics. Patient has smart phone and uses My Chart. Will have vitals ready for visit. ° °

## 2018-06-03 NOTE — Progress Notes (Signed)
Virtual Visit via Video Note   This visit type was conducted due to national recommendations for restrictions regarding the COVID-19 Pandemic (e.g. social distancing) in an effort to limit this patient's exposure and mitigate transmission in our community.  Due to his co-morbid illnesses, this patient is at least at moderate risk for complications without adequate follow up.  This format is felt to be most appropriate for this patient at this time.  All issues noted in this document were discussed and addressed.  A limited physical exam was performed with this format.  Please refer to the patient's chart for his consent to telehealth for [facility].   Date:  06/04/2018   ID:  Juan Schwartz, DOB 30-Jan-1965, MRN [id number]  Patient Location: Home Provider Location: Home  PCP:  Patient, No Pcp Per  Cardiologist:  Lesleigh Noe, MD  Electrophysiologist:  Dr. Johney Frame  Evaluation Performed:  Follow-Up Visit  Chief Complaint: afib follow up  History of Present Illness:    Juan Schwartz is a 53 y.o. male with hx of PAF, DM, HTN, OSA, obesity and alcohol abuse seen for follow up.  He was seen by cardiology in 2004 when he presented with chest pain and SOB. An echo initially showed an EF of 35%. He was cathed by Dr Clarene Duke and had normal coronaries and an EF of 50%.  Hx of afib dating back to 2017. He had afib RVR 01/2018 due to have alcohol use. Seen in afib clinic. He is on multaq 400mg  BID and Eliquis for anticoagulation.  The patient does not have symptoms concerning for COVID-19 infection (fever, chills, cough, or new shortness of breath).   Patient needed to go work during COVID-19 outbreak and following social distancing and wearing mask.  Patient reports intermittent palpitation which resolved with extra metoprolol.  This occurs once every few months.  He denies chest pain, shortness of breath, orthopnea, PND, syncope, lower extremity edema or melena.  Compliant with  medication.  He has cut back on alcohol drinking.  Not wearing CPAP, he said "my doctor told not to use this".  Unable to provide any further information.   Past Medical History:  Diagnosis Date  . Allergy   . Atrial fibrillation (HCC) 08/04/2015   CHA2DS2-VASc = 3  . CHF (congestive heart failure) (HCC) 2004   one episode  . Dehydration 08/04/2015  . Diabetes mellitus, type 2 (HCC) 11/29/2011  . Dyslipidemia 08/04/2015  . Essential hypertension 08/04/2015  . Hx of sleep apnea   . Hyperlipidemia   . Hypertension   . Normal coronary arteries 2004  . Obesity 11/29/2011  . Sleep apnea-untreated 12/16/2011   Pt denies having OSA when asked today   Past Surgical History:  Procedure Laterality Date  . CARDIAC CATHETERIZATION  2005  . CARDIOVERSION N/A 08/05/2015   Procedure: CARDIOVERSION;  Surgeon: Jake Bathe, MD;  Location: [facility] ENDOSCOPY;  Service: Cardiovascular;  Laterality: N/A;  . CARDIOVERSION N/A 07/19/2017   Procedure: CARDIOVERSION;  Surgeon: Vesta Mixer, MD;  Location: [facility] ENDOSCOPY;  Service: Cardiovascular;  Laterality: N/A;  . HERNIA REPAIR    . TEE WITHOUT CARDIOVERSION N/A 08/05/2015   Procedure: TRANSESOPHAGEAL ECHOCARDIOGRAM (TEE);  Surgeon: Jake Bathe, MD;  Location: [facility] ENDOSCOPY;  Service: Cardiovascular;  Laterality: N/A;     Current Meds  Medication Sig  . acetaminophen (TYLENOL) 500 MG tablet Take 500 mg by mouth every 6 (six) hours as needed for moderate pain or headache.  Marland Kitchen ELIQUIS  5 MG TABS tablet TAKE ONE TABLET BY MOUTH TWICE DAILY  . glipiZIDE (GLUCOTROL XL) 10 MG 24 hr tablet Take 1 tablet (10 mg total) by mouth 2 (two) times daily.  Marland Kitchen. JARDIANCE 25 MG TABS tablet Take 25 mg by mouth daily.  Marland Kitchen. lisinopril (PRINIVIL,ZESTRIL) 2.5 MG tablet Take 2.5 mg by mouth daily.  . metFORMIN (GLUCOPHAGE) 1000 MG tablet Take 1 tablet (1,000 mg total) by mouth 2 (two) times daily with a meal.  . metoprolol succinate (TOPROL-XL) 25 MG 24 hr tablet Take 1 tablet by mouth  twice daily  . MULTAQ 400 MG tablet TAKE 1 TABLET BY MOUTH TWICE DAILY WITH A MEAL  . Multiple Vitamins-Minerals (MEGA MULTI MEN) TABS Take 1 tablet by mouth daily.  . sildenafil (VIAGRA) 100 MG tablet Take 0.5-1 tablets (50-100 mg total) by mouth daily as needed for erectile dysfunction.     Allergies:   Patient has no known allergies.   Social History   Tobacco Use  . Smoking status: Former Games developermoker  . Smokeless tobacco: Never Used  Substance Use Topics  . Alcohol use: Yes    Comment: occ  . Drug use: No     Family Hx: The patient's family history includes Diabetes in his mother.  ROS:   Please see the history of present illness.    All other systems reviewed and are negative.   Prior CV studies:   The following studies were reviewed today:  Echo 10/2017 Study Conclusions  - Left ventricle: The cavity size was mildly dilated. Systolic   function was normal. The estimated ejection fraction was in the   range of 60% to 65%. Wall motion was normal; there were no   regional wall motion abnormalities. Left ventricular diastolic   function parameters were normal. - Aortic valve: Transvalvular velocity was within the normal range.   There was no stenosis. There was no regurgitation. - Mitral valve: Transvalvular velocity was within the normal range.   There was no evidence for stenosis. There was trivial   regurgitation. Valve area by pressure half-time: 1.95 cm^2. Valve   area by continuity equation (using LVOT flow): 2.02 cm^2. - Left atrium: The atrium was severely dilated. - Right ventricle: The cavity size was normal. Wall thickness was   normal. Systolic function was normal. - Atrial septum: No defect or patent foramen ovale was identified   by color flow Doppler. - Tricuspid valve: There was no regurgitation.  Labs/Other Tests and Data Reviewed:    EKG:  No ECG reviewed.  Recent Labs: 01/27/2018: BUN 15; Creatinine, Ser 0.91; Hemoglobin 14.5; Platelets 167;  Potassium 4.6; Sodium 138   Recent Lipid Panel Lab Results  Component Value Date/Time   CHOL 182 08/25/2012 08:18 AM   TRIG 193 (H) 08/25/2012 08:18 AM   HDL 48 08/25/2012 08:18 AM   CHOLHDL 3.8 08/25/2012 08:18 AM   LDLCALC 95 08/25/2012 08:18 AM    Wt Readings from Last 3 Encounters:  06/04/18 275 lb (124.7 kg)  01/31/18 300 lb (136.1 kg)  12/18/17 297 lb (134.7 kg)     Objective:    Vital Signs:  BP 99/70   Pulse 67   Ht 5\' 9"  (1.753 m)   Wt 275 lb (124.7 kg)   BMI 40.61 kg/m    VITAL SIGNS:  reviewed GEN:  no acute distress EYES:  sclerae anicteric, EOMI - Extraocular Movements Intact RESPIRATORY:  normal respiratory effort, symmetric expansion CARDIOVASCULAR:  no peripheral edema SKIN:  no rash, lesions  or ulcers. MUSCULOSKELETAL:  no obvious deformities. NEURO:  alert and oriented x 3, no obvious focal deficit PSYCH:  normal affect  ASSESSMENT & PLAN:    1. PAF - On Multaq and eliquis.  Intermittent palpitation occurring once every few months which resolves with extra metoprolol.  Stable for months.  2. HTN -Blood pressure relatively stable on current medication.  No change.  3. DM -Continue current diabetic medication.  4. Alcohol abuse  -He has cut back on alcohol drinking  COVID-19 Education: The signs and symptoms of COVID-19 were discussed with the patient and how to seek care for testing (follow up with PCP or arrange E-visit).  The importance of social distancing was discussed today.  Time:   Today, I have spent 7 minutes with the patient with telehealth technology discussing the above problems.     Medication Adjustments/Labs and Tests Ordered: Current medicines are reviewed at length with the patient today.  Concerns regarding medicines are outlined above.   Tests Ordered: No orders of the defined types were placed in this encounter.   Medication Changes: No orders of the defined types were placed in this encounter.   Disposition:   Follow up in 6 month(s)  Signed, Manson Passey, PA  06/04/2018 8:30 AM    [facility]

## 2018-06-04 ENCOUNTER — Encounter: Payer: Self-pay | Admitting: Physician Assistant

## 2018-06-04 ENCOUNTER — Telehealth (INDEPENDENT_AMBULATORY_CARE_PROVIDER_SITE_OTHER): Payer: 59 | Admitting: Physician Assistant

## 2018-06-04 ENCOUNTER — Other Ambulatory Visit: Payer: Self-pay

## 2018-06-04 VITALS — BP 99/70 | HR 67 | Ht 69.0 in | Wt 275.0 lb

## 2018-06-04 DIAGNOSIS — I480 Paroxysmal atrial fibrillation: Secondary | ICD-10-CM

## 2018-06-04 DIAGNOSIS — G4730 Sleep apnea, unspecified: Secondary | ICD-10-CM

## 2018-06-04 DIAGNOSIS — Z7901 Long term (current) use of anticoagulants: Secondary | ICD-10-CM

## 2018-06-04 DIAGNOSIS — I10 Essential (primary) hypertension: Secondary | ICD-10-CM

## 2018-06-04 DIAGNOSIS — E119 Type 2 diabetes mellitus without complications: Secondary | ICD-10-CM

## 2018-06-04 MED ORDER — LISINOPRIL 2.5 MG PO TABS: 2.5000 mg | ORAL_TABLET | Freq: Every day | ORAL | 3 refills | Status: DC

## 2018-06-04 MED ORDER — METOPROLOL SUCCINATE ER 25 MG PO TB24: 25.0000 mg | ORAL_TABLET | Freq: Two times a day (BID) | ORAL | 3 refills | Status: DC

## 2018-06-04 MED ORDER — DRONEDARONE HCL 400 MG PO TABS: ORAL_TABLET | ORAL | 11 refills | Status: DC

## 2018-06-04 MED ORDER — APIXABAN 5 MG PO TABS: 5.0000 mg | ORAL_TABLET | Freq: Two times a day (BID) | ORAL | 3 refills | Status: DC

## 2018-06-04 NOTE — Patient Instructions (Signed)
Medication Instructions:  Your physician recommends that you continue on your current medications as directed. Please refer to the Current Medication list given to you today.  Refills have been sent in to your pharmacy for your Cardiac medications.  If you need a refill on your cardiac medications before your next appointment, please call your pharmacy.   Lab work: NONE If you have labs (blood work) drawn today and your tests are completely normal, you will receive your results only by: Marland Kitchen MyChart Message (if you have MyChart) OR . A paper copy in the mail If you have any lab test that is abnormal or we need to change your treatment, we will call you to review the results.  Testing/Procedures: NONE  Follow-Up: At [facility], you and your health needs are our priority.  As part of our continuing mission to provide you with exceptional heart care, we have created designated Provider Care Teams.  These Care Teams include your primary Cardiologist (physician) and Advanced Practice Providers (APPs -  Physician Assistants and Nurse Practitioners) who all work together to provide you with the care you need, when you need it. You will need a follow up appointment in 6 months.  Please call our office 2 months in advance to schedule this appointment.  You may see Lesleigh Noe, MD or one of the following Advanced Practice Providers on your designated Care Team:   Norma Fredrickson, NP Nada Boozer, NP . Georgie Chard, NP  Any Other Special Instructions Will Be Listed Below (If Applicable).    Marland Kitchen

## 2018-06-11 ENCOUNTER — Telehealth: Payer: Self-pay | Admitting: Interventional Cardiology

## 2018-06-11 NOTE — Telephone Encounter (Signed)
Pt states he was in and out of Afib over the weekend.  He doubled up his Metoprolol Succ 50mg  to BID over the weekend.  Had terrible dizziness all weekend.  It has improved today.  Pt put pulse ox on finger while on the phone and it was 99 then 110 then 80s.  Had pt feel pulse in his wrist.  States he can feel 9 beats and then feels a rapid pulse.  Been sitting around mostly today but when he bends over he gets dizzy.  Advised I will send message to PA and have them review.  Pt appreciative for call.

## 2018-06-11 NOTE — Telephone Encounter (Signed)
Spoke with pt and made him aware of recommendations.  Tried to reach Afib clinic but they are closed.  Advised I will send message to Afib RN and have her reach out for appt.  Pt appreciative for call.

## 2018-06-11 NOTE — Telephone Encounter (Signed)
Message received from triage.  Patient's chart reviewed.  Sounds like he is having bouts of A. fib.  I will have him seen virtually with Juan Schwartz in the A. fib clinic tomorrow.  If he has not missed any doses of anticoagulation, he may need to be cardioverted.  Will send message to Juan Schwartz.

## 2018-06-11 NOTE — Telephone Encounter (Signed)
Patient had an afib episode on Saturday and he states that he still is not feeling well. He is having some dizziness every time he bends over. He wants to know if he needs to see the dr or if there is something else he can do. His HR is 112 right now while on the phone and goes down in the 70s. He tried to work today and got so dizzy he had to come back home. He is taking metoprolol 50 mg twice daily which is double his prescribed dosage. He seemed to feel a bit better when he took the extra dosage but he only took the 25 mg this morning. Please advise.

## 2018-06-12 ENCOUNTER — Ambulatory Visit (HOSPITAL_COMMUNITY)
Admission: RE | Admit: 2018-06-12 | Discharge: 2018-06-12 | Disposition: A | Discharge status: Home / Self Care | Payer: 59 | Source: Ambulatory Visit | Attending: Physician Assistant | Admitting: Physician Assistant

## 2018-06-12 ENCOUNTER — Other Ambulatory Visit: Payer: Self-pay

## 2018-06-12 ENCOUNTER — Other Ambulatory Visit (HOSPITAL_COMMUNITY): Payer: Self-pay | Admitting: *Deleted

## 2018-06-12 ENCOUNTER — Encounter (HOSPITAL_COMMUNITY): Payer: Self-pay | Admitting: Physician Assistant

## 2018-06-12 VITALS — HR 82 | Ht 69.0 in

## 2018-06-12 DIAGNOSIS — I480 Paroxysmal atrial fibrillation: Secondary | ICD-10-CM

## 2018-06-12 MED ORDER — METOPROLOL SUCCINATE ER 25 MG PO TB24: 25.0000 mg | ORAL_TABLET | Freq: Two times a day (BID) | ORAL | 3 refills | Status: DC

## 2018-06-12 NOTE — Telephone Encounter (Addendum)
Patient feeling better today but still can feel the fluttering. While on the phone HR was in the 80s. Concerned of what he should be doing with his metoprolol since he will run out early since he took extra over weekend. He took his normal dosing of metoprolol today. Also does not want to end up in the ER so wanted to do virtual visit today.

## 2018-06-12 NOTE — Progress Notes (Signed)
Electrophysiology TeleHealth Note   Due to national recommendations of social distancing due to COVID 19, Audio/video telehealth visit is felt to be most appropriate for this patient at this time.  See consent below from today for patient consent regarding telehealth for the Atrial Fibrillation Clinic. Consent obtained verbally.   Date:  06/12/2018   ID:  Juan Schwartz, DOB 1965/10/22, MRN [id number]  Location: home  Provider location: [address] [place], [place] [address] Evaluation Performed: Follow up  PCP:  Patient, No Pcp Per  Primary Cardiologist: Dr Katrinka Blazing Primary Electrophysiologist: Dr Johney Frame   CC: Follow up for atrial fibrillation   History of Present Illness: Juan Schwartz is a 53 y.o. male who presents via audio/video conferencing for a telehealth visit today.  Patient states that Friday afternoon at work he began to feel dizzy when he would bend over. He checked his heart rate on his pulse ox which showed rates in the 120s. He double his metoprolol through the weekend. Today, he feels much better with no dizziness and his HR is in the 80s on his regular dose of metoprolol. There were no specific triggers that the patient could identify. No significant alcohol use.  Today, he denies symptoms of palpitations, chest pain, shortness of breath, orthopnea, PND, lower extremity edema, claudication, dizziness, presyncope, syncope, bleeding, or neurologic sequela. The patient is tolerating medications without difficulties and is otherwise without complaint today.   he denies symptoms of cough, fevers, chills, or new SOB worrisome for COVID 19.    Atrial Fibrillation Risk Factors:  he does have symptoms or diagnosis of sleep apnea. he is not compliant with CPAP therapy. he does not have a history of rheumatic fever. he does have a history of alcohol use.   he has a BMI of Body mass index is 40.61 kg/m.Marland Kitchen Filed Weights    Past Medical History:  Diagnosis  Date  . Allergy   . Atrial fibrillation (HCC) 08/04/2015   CHA2DS2-VASc = 3  . CHF (congestive heart failure) (HCC) 2004   one episode  . Dehydration 08/04/2015  . Diabetes mellitus, type 2 (HCC) 11/29/2011  . Dyslipidemia 08/04/2015  . Essential hypertension 08/04/2015  . Hx of sleep apnea   . Hyperlipidemia   . Hypertension   . Normal coronary arteries 2004  . Obesity 11/29/2011  . Sleep apnea-untreated 12/16/2011   Pt denies having OSA when asked today   Past Surgical History:  Procedure Laterality Date  . CARDIAC CATHETERIZATION  2005  . CARDIOVERSION N/A 08/05/2015   Procedure: CARDIOVERSION;  Surgeon: Jake Bathe, MD;  [facility] END[facility]COPY;  Service: Cardiovascular;  Laterality: N/A;  . CARDIOVERSION N/A 07/19/2017   Procedure: CARDIOVERSION;  Surgeon: Vesta Mixer, MD;  Location: [facility] ENDOSCOPY;  Service: Cardiovascular;  Laterality: N/A;  . HERNIA REPAIR    . TEE WITHOUT CARDIOVERSION N/A 08/05/2015   Procedure: TRANSESOPHAGEAL ECHOCARDIOGRAM (TEE);  Surgeon: Jake Bathe, MD;  Location: [facility] ENDOSCOPY;  Service: Cardiovascular;  Laterality: N/A;     Current Outpatient Medications  Medication Sig Dispense Refill  . acetaminophen (TYLENOL) 500 MG tablet Take 500 mg by mouth every 6 (six) hours as needed for moderate pain or headache.    Marland Kitchen apixaban (ELIQUIS) 5 MG TABS tablet Take 1 tablet (5 mg total) by mouth 2 (two) times daily. 180 tablet 3  . dronedarone (MULTAQ) 400 MG tablet TAKE 1 TABLET BY MOUTH TWICE DAILY WITH A MEAL 60 tablet 11  . glipiZIDE (GLUCOTROL  XL) 10 MG 24 hr tablet Take 1 tablet (10 mg total) by mouth 2 (two) times daily. 60 tablet 3  . JARDIANCE 25 MG TABS tablet Take 25 mg by mouth daily.  6  . lisinopril (ZESTRIL) 2.5 MG tablet Take 1 tablet (2.5 mg total) by mouth daily. 90 tablet 3  . metFORMIN (GLUCOPHAGE) 1000 MG tablet Take 1 tablet (1,000 mg total) by mouth 2 (two) times daily with a meal. 180 tablet 3  . metoprolol succinate (TOPROL-XL) 25 MG  24 hr tablet Take 1 tablet (25 mg total) by mouth 2 (two) times daily. 180 tablet 3  . sildenafil (VIAGRA) 100 MG tablet Take 0.5-1 tablets (50-100 mg total) by mouth daily as needed for erectile dysfunction. 5 tablet 11  . Multiple Vitamins-Minerals (MEGA MULTI MEN) TABS Take 1 tablet by mouth daily.     No current facility-administered medications for this encounter.     Allergies:   Patient has no known allergies.   Social History:  The patient  reports that he has quit smoking. He has never used smokeless tobacco. He reports current alcohol use. He reports that he does not use drugs.   Family History:  The patient's  family history includes Diabetes in his mother.    ROS:  Please see the history of present illness.   All other systems are personally reviewed and negative.   Exam: Well appearing, alert and conversant, regular work of breathing,  good skin color Eyes- anicteric, neuro- grossly intact, skin- no apparent rash or lesions or cyanosis, mouth- oral mucosa is pink   Recent Labs: 01/27/2018: BUN 15; Creatinine, Ser 0.91; Hemoglobin 14.5; Platelets 167; Potassium 4.6; Sodium 138  personally reviewed    Other studies personally reviewed: Additional studies/ records that were reviewed today include: Epic notes   ASSESSMENT AND PLAN:  1.  Paroxysmal atrial fibrillation Patient appears to have had episode of afib over the weekend. No further symptoms today, likely back in SR, would not pursue cardioversion.  We discussed the possibility of changing AAD should his symptoms worsen or become more frequent.  Continue Toprol 25 mg BID. Continue Multaq 400 mg BID Continue Eliquis 5 mg BID  This patients CHA2DS2-VASc Score and unadjusted Ischemic Stroke Rate (% per year) is equal to 2.2 % stroke rate/year from a score of 2  Above score calculated as 1 point each if present [CHF, HTN, DM, Vascular=MI/PAD/Aortic Plaque, Age if 65-74, or Male] Above score calculated as 2 points  each if present [Age > 75, or Stroke/TIA/TE]  2. HTN No changes today. Encouraged pt to get home BP machine.  3. Obesity Body mass index is 40.61 kg/m. Lifestyle modification was discussed and encouraged including regular physical activity and weight reduction.  4. OSA Not on CPAP therapy.  COVID screen The patient does not have any symptoms that suggest any further testing/ screening at this time.  Social distancing reinforced today.    Follow-up with AF clinic in 2 months via telehealth.  Current medicines are reviewed at length with the patient today.   The patient does not have concerns regarding his medicines.  The following changes were made today:  none  Labs/ tests ordered today include:  No orders of the defined types were placed in this encounter.   Patient Risk:  after full review of this patients clinical status, I feel that they are at moderate risk at this time.   Today, I have spent 24 minutes with the patient with telehealth technology  discussing atrial fibrillation, medications, lifestyle, and COVID-19 precautions.    Dalia HeadingSigned, Ricky Tyliyah Mcmeekin PA-C 06/12/2018 3:45 PM  [facility] [address] [place], [place] [id number] [phone]   I hereby voluntarily request, consent and authorize the Atrial Fibrillation Clinic and its employed or contracted physicians, physician assistants, nurse practitioners or other licensed health care professionals (the Practitioner), to provide me with telemedicine health care services (the "Services") as deemed necessary by the treating Practitioner. I acknowledge and consent to receive the Services by the Practitioner via telemedicine. I understand that the telemedicine visit will involve communicating with the Practitioner through live audiovisual communication technology and the disclosure of certain medical information by electronic transmission. I acknowledge that I have been given the opportunity to request  an in-person assessment or other available alternative prior to the telemedicine visit and am voluntarily participating in the telemedicine visit.   I understand that I have the right to withhold or withdraw my consent to the use of telemedicine in the course of my care at any time, without affecting my right to future care or treatment, and that the Practitioner or I may terminate the telemedicine visit at any time. I understand that I have the right to inspect all information obtained and/or recorded in the course of the telemedicine visit and may receive copies of available information for a reasonable fee.  I understand that some of the potential risks of receiving the Services via telemedicine include:   Delay or interruption in medical evaluation due to technological equipment failure or disruption;  Information transmitted may not be sufficient (e.g. poor resolution of images) to allow for appropriate medical decision making by the Practitioner; and/or  In rare instances, security protocols could fail, causing a breach of personal health information.   Furthermore, I acknowledge that it is my responsibility to provide information about my medical history, conditions and care that is complete and accurate to the best of my ability. I acknowledge that Practitioner's advice, recommendations, and/or decision may be based on factors not within their control, such as incomplete or inaccurate data provided by me or distortions of diagnostic images or specimens that may result from electronic transmissions. I understand that the practice of medicine is not an exact science and that Practitioner makes no warranties or guarantees regarding treatment outcomes. I acknowledge that I will receive a copy of this consent concurrently upon execution via email to the email address I last provided but may also request a printed copy by calling the office of the Atrial Fibrillation Clinic.  I understand that my  insurance will be billed for this visit.   I have read or had this consent read to me.  I understand the contents of this consent, which adequately explains the benefits and risks of the Services being provided via telemedicine.  I have been provided ample opportunity to ask questions regarding this consent and the Services and have had my questions answered to my satisfaction.  I give my informed consent for the services to be provided through the use of telemedicine in my medical care  By participating in this telemedicine visit I agree to the above.

## 2018-10-11 ENCOUNTER — Telehealth: Payer: Self-pay | Admitting: *Deleted

## 2018-10-11 NOTE — Telephone Encounter (Signed)
PA for MULTAQ was approved through covermymeds.

## 2018-10-11 NOTE — Telephone Encounter (Signed)
Prior authorization for MULTAQ submitted through covermymeds.

## 2019-03-19 ENCOUNTER — Telehealth: Payer: Self-pay | Admitting: Interventional Cardiology

## 2019-03-19 NOTE — Telephone Encounter (Signed)
Left message to discuss

## 2019-03-19 NOTE — Telephone Encounter (Signed)
  Patient visited the medical services at his job because he feel like his heart is out of rhythm. During his visit to the medical services his HR fluctuated between 93-114.   The nurse at the medical services center advised him to contact his Cardiologist. He does not have any other symptoms, but he just wants to know what he should do

## 2019-03-19 NOTE — Telephone Encounter (Signed)
Patient has felt some intermittent heart racing since the weekend. Has been taking extra metoprolol which will help settle out HR but HR still running 80-119. Appt made for tomorrow for further assessment as pt is past due.

## 2019-03-20 ENCOUNTER — Other Ambulatory Visit: Payer: Self-pay

## 2019-03-20 ENCOUNTER — Ambulatory Visit (HOSPITAL_COMMUNITY)
Admission: RE | Admit: 2019-03-20 | Discharge: 2019-03-20 | Disposition: A | Discharge status: Home / Self Care | Payer: 59 | Source: Ambulatory Visit | Attending: Physician Assistant | Admitting: Physician Assistant

## 2019-03-20 ENCOUNTER — Encounter (HOSPITAL_COMMUNITY): Payer: Self-pay | Admitting: Physician Assistant

## 2019-03-20 VITALS — BP 118/82 | HR 96 | Ht 69.0 in | Wt 307.4 lb

## 2019-03-20 DIAGNOSIS — Z7984 Long term (current) use of oral hypoglycemic drugs: Secondary | ICD-10-CM | POA: Insufficient documentation

## 2019-03-20 DIAGNOSIS — D6869 Other thrombophilia: Secondary | ICD-10-CM | POA: Diagnosis not present

## 2019-03-20 DIAGNOSIS — Z7901 Long term (current) use of anticoagulants: Secondary | ICD-10-CM | POA: Diagnosis not present

## 2019-03-20 DIAGNOSIS — Z87891 Personal history of nicotine dependence: Secondary | ICD-10-CM | POA: Diagnosis not present

## 2019-03-20 DIAGNOSIS — Z01810 Encounter for preprocedural cardiovascular examination: Secondary | ICD-10-CM | POA: Diagnosis not present

## 2019-03-20 DIAGNOSIS — Z79899 Other long term (current) drug therapy: Secondary | ICD-10-CM | POA: Diagnosis not present

## 2019-03-20 DIAGNOSIS — Z01818 Encounter for other preprocedural examination: Secondary | ICD-10-CM | POA: Insufficient documentation

## 2019-03-20 DIAGNOSIS — Z9114 Patient's other noncompliance with medication regimen: Secondary | ICD-10-CM | POA: Insufficient documentation

## 2019-03-20 DIAGNOSIS — I10 Essential (primary) hypertension: Secondary | ICD-10-CM | POA: Insufficient documentation

## 2019-03-20 DIAGNOSIS — I480 Paroxysmal atrial fibrillation: Secondary | ICD-10-CM | POA: Diagnosis not present

## 2019-03-20 DIAGNOSIS — E118 Type 2 diabetes mellitus with unspecified complications: Secondary | ICD-10-CM | POA: Diagnosis not present

## 2019-03-20 DIAGNOSIS — E669 Obesity, unspecified: Secondary | ICD-10-CM | POA: Insufficient documentation

## 2019-03-20 DIAGNOSIS — E785 Hyperlipidemia, unspecified: Secondary | ICD-10-CM | POA: Diagnosis not present

## 2019-03-20 DIAGNOSIS — Z6841 Body Mass Index (BMI) 40.0 and over, adult: Secondary | ICD-10-CM | POA: Diagnosis not present

## 2019-03-20 DIAGNOSIS — Z20822 Contact with and (suspected) exposure to covid-19: Secondary | ICD-10-CM | POA: Diagnosis not present

## 2019-03-20 DIAGNOSIS — I4892 Unspecified atrial flutter: Secondary | ICD-10-CM | POA: Insufficient documentation

## 2019-03-20 DIAGNOSIS — Z01812 Encounter for preprocedural laboratory examination: Secondary | ICD-10-CM | POA: Diagnosis not present

## 2019-03-20 DIAGNOSIS — G4733 Obstructive sleep apnea (adult) (pediatric): Secondary | ICD-10-CM | POA: Insufficient documentation

## 2019-03-20 LAB — CBC
HCT: 48.5 % (ref 39.0–52.0)
Hemoglobin: 15.5 g/dL (ref 13.0–17.0)
MCH: 30.4 pg (ref 26.0–34.0)
MCHC: 32 g/dL (ref 30.0–36.0)
MCV: 95.1 fL (ref 80.0–100.0)
Platelets: 155 10*3/uL (ref 150–400)
RBC: 5.1 MIL/uL (ref 4.22–5.81)
RDW: 14.1 % (ref 11.5–15.5)
WBC: 7.4 10*3/uL (ref 4.0–10.5)
nRBC: 0 % (ref 0.0–0.2)

## 2019-03-20 LAB — BASIC METABOLIC PANEL
Anion gap: 13 (ref 5–15)
BUN: 14 mg/dL (ref 6–20)
CO2: 18 mmol/L — ABNORMAL LOW (ref 22–32)
Calcium: 9.4 mg/dL (ref 8.9–10.3)
Chloride: 108 mmol/L (ref 98–111)
Creatinine, Ser: 0.79 mg/dL (ref 0.61–1.24)
GFR calc Af Amer: >60 mL/min (ref >60)
GFR calc non Af Amer: >60 mL/min (ref >60)
Glucose, Bld: 192 mg/dL — ABNORMAL HIGH (ref 70–99)
Potassium: 4.6 mmol/L (ref 3.5–5.1)
Sodium: 139 mmol/L (ref 135–145)

## 2019-03-20 NOTE — H&P (View-Only) (Signed)
Patient ID: Juan Schwartz, male   DOB: Jul 28, 1965, 54 y.o.   MRN: [id number]     Primary Care Physician: Patient, No Pcp Per Referring Physician: [facility] F/U Cardiologist: Dr. Katrinka Blazing Primary EP: Dr Eliezer Lofts Juan Schwartz is a 54 y.o. AA male with a h/o obesity, HTN, untreated sleep apnea, daily ETOH ("2 beers a day"), HLD, and NIDDM. He was seen by cardiology in 2004 when he presented with chest pain and SOB. An echo initially showed an EF of 35%. He was cathed by Dr Clarene Duke and had normal coronaries and an EF of 50%. It was felt he had an episode of CHF then but he has not had any further episodes.   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 He began to get weak and decided to stop for the day. The morning of admit 08/04/15 he went to work (works outside) and as soon as it started to warm up he again felt weak. He denied any chest pain or dyspnea. He denied any palpations. In the ED his EKG shows AF with RVR @ 126, poor ant RW. His B/P is a little low and his BUN was 24 suggesting he is mildly dehydrated. He was admitted and hydrated, IV heparin and dilt. It was noted he had sleep apnea and will need out pt sleep study.   His cardiac enzymes were negative. His TSH was normal. K+and Mg+ normal. He was eventually place on IV amiodarone and underwent TEE and DCCV on 08/05/15. EF was 45% with diffuse hypokinesis Tr MR. DCCV was successful and pt has maintained SR since.   He was seen by Dr. Katrinka Blazing and amiodarone was stopped. Metoprolol was added. His glucose off Metformin was elevated during hospitaltiztion.   Eliquis  added for 4 weeks and no asprin for that time. Plan for out pt sleep study.  F/u in afib clinic, 08/12/16. Pt states that he feels well. No return of afib. He returns to work next Tuesday and he does work outside in the heat. BP soft today at 98/64. He states that BP has been lower since metoprolol was added. He is not symptomatic with this currently.  He does have a sleep study pending. He is being compliant with eliquis and with a a chadsvasc score of at least 2(HYN, DM), it is felt that he should stay on anticoagulant.He has lost 150 lbs over the last couple of years, at one point weighting 468 lbs. He has plateaued with his weight  and plans to return to the gym and restart his walking 1-2 miles 2x a day. We discussed alcohol and afib connection and he has quit alcohol since hospitalization and plans  not drink alcohol going forward.   F/u in afib clinic 05/05/16.He recently was seen by [name], [name], for f/u and reported that he had not felt well recentlyy with more dizziness, fatigue.He was found to be in afib. It was recommended that he undergo a cpap titration trial due to return of afib but sleep study showed only mild sleep apnea last year. Pt states today that he does not want to pursue this. He states that it is all his fault that he went into afib as he had been non compliant with his meds. He was only taking BB occassionally and would often skip his anticoagulant.  He has increased his alcohol intake.He said before he did well with he followed the plan without afib.  F/u ina fib clinic for  recent ER visit 01/31/2018. He had been drinking a lot of beer over the holidays and had return of afib. He was rate controlled at time of ER visit and d/c home. Now in afib clinic he is back in [name]. He converted about 2 hours after returning home from the ER. The ER MD suggested to go up on daily BB but this made him feel lightheaded. He has not had anymore alcohol and is writing this off. He continues on Multaq. He feels the holiday alcohol use was his trigger for recent afib episode.  Follow up in the AF clinic 03/20/19. Patient reports that on 03/16/19 he felt lightheaded and check his pulse oximeter which showed a rate in the 130s. He doubled his BB dose which resolved his symptoms. He remains in rate controlled afib today. Patient admits that the night  before, he had a glass of wine and 3-4 beers. Alcohol has been a trigger for his afib in the past. He is on Eliquis for a CHADS2VASC score of 2, he denies any missed doses of anticoagulation.   Today, he denies symptoms of palpitations, chest pain, shortness of breath, orthopnea, PND, lower extremity edema, presyncope, syncope, or neurologic sequela. The patient is tolerating medications without difficulties and is otherwise without complaint today.   Past Medical History:  Diagnosis Date  . Allergy   . Atrial fibrillation ([place]) 08/04/2015   CHA2DS2-VASc = 3  . CHF (congestive heart failure) ([place]) 2004   one episode  . Dehydration 08/04/2015  . Diabetes mellitus, type 2 ([place]) 11/29/2011  . Dyslipidemia 08/04/2015  . Essential hypertension 08/04/2015  . Hx of sleep apnea   . Hyperlipidemia   . Hypertension   . Normal coronary arteries 2004  . Obesity 11/29/2011  . Sleep apnea-untreated 12/16/2011   Pt denies having OSA when asked today   Past Surgical History:  Procedure Laterality Date  . CARDIAC CATHETERIZATION  2005  . CARDIOVERSION N/A 08/05/2015   Procedure: CARDIOVERSION;  Surgeon: Jerline Pain, MD;  Location: [place];  Service: Cardiovascular;  Laterality: N/A;  . CARDIOVERSION N/A 07/19/2017   Procedure: CARDIOVERSION;  Surgeon: Thayer Headings, MD;  Location: [place];  Service: Cardiovascular;  Laterality: N/A;  . HERNIA REPAIR    . TEE WITHOUT CARDIOVERSION N/A 08/05/2015   Procedure: TRANSESOPHAGEAL ECHOCARDIOGRAM (TEE);  Surgeon: Jerline Pain, MD;  Location[facility] ENDOSCOPY;  Service: Cardiovascular;  Laterality: N/A;    Current Outpatient Medications  Medication Sig Dispense Refill  . acetaminophen (TYLENOL) 500 MG tablet Take 500 mg by mouth every 6 (six) hours as needed for moderate pain or headache.    Marland Kitchen apixaban (ELIQUIS) 5 MG TABS tablet Take 1 tablet (5 mg total) by mouth 2 (two) times daily. 180 tablet 3  . dronedarone (MULTAQ) 400 MG tablet TAKE 1 TABLET BY  MOUTH TWICE DAILY WITH A MEAL 60 tablet 11  . glipiZIDE (GLUCOTROL XL) 10 MG 24 hr tablet Take 1 tablet (10 mg total) by mouth 2 (two) times daily. 60 tablet 3  . ipratropium (ATROVENT) 0.03 % nasal spray ipratropium bromide 21 mcg (0.03 %) nasal spray    . JARDIANCE 25 MG TABS tablet Take 25 mg by mouth daily.  6  . lisinopril (ZESTRIL) 2.5 MG tablet Take 1 tablet (2.5 mg total) by mouth daily. 90 tablet 3  . metFORMIN (GLUCOPHAGE) 1000 MG tablet Take 1 tablet (1,000 mg total) by mouth 2 (two) times daily with a meal. 180 tablet 3  . metoprolol  succinate (TOPROL-XL) 25 MG 24 hr tablet Take 1 tablet (25 mg total) by mouth 2 (two) times daily. May take an extra tablet as needed for breakthrough 200 tablet 3  . Multiple Vitamins-Minerals (MEGA MULTI MEN) TABS Take 1 tablet by mouth daily.    . sildenafil (VIAGRA) 100 MG tablet Take 0.5-1 tablets (50-100 mg total) by mouth daily as needed for erectile dysfunction. 5 tablet 11   No current facility-administered medications for this encounter.    No Known Allergies  Social History   Socioeconomic History  . Marital status: Married    Spouse name: Not on file  . Number of children: Not on file  . Years of education: Not on file  . Highest education level: Not on file  Occupational History  . Not on file  Tobacco Use  . Smoking status: Former Games developer  . Smokeless tobacco: Never Used  Substance and Sexual Activity  . Alcohol use: Yes    Alcohol/week: 4.0 standard drinks    Types: 2 Cans of beer, 2 Glasses of wine per week    Comment: occ  . Drug use: No  . Sexual activity: Yes  Other Topics Concern  . 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   Social Determinants of Health   Financial Resource Strain:   . Difficulty of Paying Living Expenses: Not on file  Food Insecurity:   . Worried About Programme researcher, broadcasting/film/video in the Last Year: Not on file  . Ran Out of Food  in the Last Year: Not on file  Transportation Needs:   . Lack of Transportation (Medical): Not on file  . Lack of Transportation (Non-Medical): Not on file  Physical Activity:   . Days of Exercise per Week: Not on file  . Minutes of Exercise per Session: Not on file  Stress:   . Feeling of Stress : Not on file  Social Connections:   . Frequency of Communication with Friends and Family: Not on file  . Frequency of Social Gatherings with Friends and Family: Not on file  . Attends Religious Services: Not on file  . Active Member of Clubs or Organizations: Not on file  . Attends Banker Meetings: Not on file  . Marital Status: Not on file  Intimate Partner Violence:   . Fear of Current or Ex-Partner: Not on file  . Emotionally Abused: Not on file  . Physically Abused: Not on file  . Sexually Abused: Not on file    Family History  Problem Relation Age of Onset  . Diabetes Mother     ROS- All systems are reviewed and negative except as per the HPI above  Physical Exam: Vitals:   03/20/19 0832  BP: 118/82  Pulse: 96  Weight: (!) 139.4 kg  Height: 5\' 9"  (1.753 m)    GEN- The patient is well appearing obese male, alert and oriented x 3 today.   HEENT-head normocephalic, atraumatic, sclera clear, conjunctiva pink, hearing intact, trachea midline. Lungs- Clear to ausculation bilaterally, normal work of breathing Heart- irregular rate and rhythm, no murmurs, rubs or gallops  GI- soft, NT, ND, + BS Extremities- no clubbing, cyanosis, or edema MS- no significant deformity or atrophy Skin- no rash or lesion Psych- euthymic mood, full affect Neuro- strength and sensation are intact   EKG- afib HR 96, RAD, slow R wave prog, QRS 86, QTc 439  Echo 11/13/17  demonstrated  Left ventricle: The cavity size was mildly dilated. Systolic  function was normal. The estimated ejection fraction was in the  range of 60% to 65%. Wall motion was normal; there were no    regional wall motion abnormalities. Left ventricular diastolic  function parameters were normal.  - Aortic valve: Transvalvular velocity was within the normal range.  There was no stenosis. There was no regurgitation.  - Mitral valve: Transvalvular velocity was within the normal range.  There was no evidence for stenosis. There was trivial  regurgitation. Valve area by pressure half-time: 1.95 cm^2. Valve  area by continuity equation (using LVOT flow): 2.02 cm^2.  - Left atrium: The atrium was severely dilated.  - Right ventricle: The cavity size was normal. Wall thickness was  normal. Systolic function was normal.  - Atrial septum: No defect or patent foramen ovale was identified  by color flow Doppler.  - Tricuspid valve: There was no regurgitation.   Epic records reviewed  Assessment and Plan: 1. Paroxysmal atrial fibrillation Patient in afib since 2/20, likely triggered by alcohol use. We discussed lifestyle modification including alcohol cessation in detail today. Will arrange for DCCV.  Continue increased dose of Toprol 50 mg BID until DCCV, then decrease back to 25 mg BID dosing.  Continue Multaq 400 mg BID Continue Eliquis 5 mg BID  This patients CHA2DS2-VASc Score and unadjusted Ischemic Stroke Rate (% per year) is equal to 2.2 % stroke rate/year from a score of 2  Above score calculated as 1 point each if present [CHF, HTN, DM, Vascular=MI/PAD/Aortic Plaque, Age if 65-74, or Male] Above score calculated as 2 points each if present [Age > 75, or Stroke/TIA/TE]   2. HTN Stable, no changes today.  3. OSA The importance of adequate treatment of sleep apnea was discussed today in order to improve our ability to maintain sinus rhythm long term. Patient not on CPAP therapy.  4. Obesity Body mass index is 45.4 kg/m. Lifestyle modification was discussed and encouraged including regular physical activity and weight reduction.   Follow up in the AF  clinic one week post DCCV.   Jorja Loa PA-C Afib Clinic [facility] [address] [place], [place] [address] [phone]

## 2019-03-20 NOTE — Patient Instructions (Signed)
Cardioversion scheduled for Monday, March 1st  - Arrive at the [facility] and go to admitting at [facility] not eat or drink anything after midnight the night prior to your procedure.  - Take all your morning medication with a sip of water prior to arrival.  - You will not be able to drive home after your procedure.   Day of cardioversion go back to metoprolol 25mg  twice a day

## 2019-03-20 NOTE — Progress Notes (Signed)
Patient ID: Juan Schwartz, male   DOB: Jul 28, 1965, 54 y.o.   MRN: [id number]     Primary Care Physician: Patient, No Pcp Per Referring Physician: [facility] F/U Cardiologist: Dr. Katrinka Blazing Primary EP: Dr Eliezer Lofts Juan Schwartz is a 54 y.o. AA male with a h/o obesity, HTN, untreated sleep apnea, daily ETOH ("2 beers a day"), HLD, and NIDDM. He was seen by cardiology in 2004 when he presented with chest pain and SOB. An echo initially showed an EF of 35%. He was cathed by Dr Clarene Duke and had normal coronaries and an EF of 50%. It was felt he had an episode of CHF then but he has not had any further episodes.   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 He began to get weak and decided to stop for the day. The morning of admit 08/04/15 he went to work (works outside) and as soon as it started to warm up he again felt weak. He denied any chest pain or dyspnea. He denied any palpations. In the ED his EKG shows AF with RVR @ 126, poor ant RW. His B/P is a little low and his BUN was 24 suggesting he is mildly dehydrated. He was admitted and hydrated, IV heparin and dilt. It was noted he had sleep apnea and will need out pt sleep study.   His cardiac enzymes were negative. His TSH was normal. K+and Mg+ normal. He was eventually place on IV amiodarone and underwent TEE and DCCV on 08/05/15. EF was 45% with diffuse hypokinesis Tr MR. DCCV was successful and pt has maintained SR since.   He was seen by Dr. Katrinka Blazing and amiodarone was stopped. Metoprolol was added. His glucose off Metformin was elevated during hospitaltiztion.   Eliquis  added for 4 weeks and no asprin for that time. Plan for out pt sleep study.  F/u in afib clinic, 08/12/16. Pt states that he feels well. No return of afib. He returns to work next Tuesday and he does work outside in the heat. BP soft today at 98/64. He states that BP has been lower since metoprolol was added. He is not symptomatic with this currently.  He does have a sleep study pending. He is being compliant with eliquis and with a a chadsvasc score of at least 2(HYN, DM), it is felt that he should stay on anticoagulant.He has lost 150 lbs over the last couple of years, at one point weighting 468 lbs. He has plateaued with his weight  and plans to return to the gym and restart his walking 1-2 miles 2x a day. We discussed alcohol and afib connection and he has quit alcohol since hospitalization and plans  not drink alcohol going forward.   F/u in afib clinic 05/05/16.He recently was seen by Juan Schwartz, [name], for f/u and reported that he had not felt well recentlyy with more dizziness, fatigue.He was found to be in afib. It was recommended that he undergo a cpap titration trial due to return of afib but sleep study showed only mild sleep apnea last year. Pt states today that he does not want to pursue this. He states that it is all his fault that he went into afib as he had been non compliant with his meds. He was only taking BB occassionally and would often skip his anticoagulant.  He has increased his alcohol intake.He said before he did well with he followed the plan without afib.  F/u ina fib clinic for  recent ER visit 01/31/2018. He had been drinking a lot of beer over the holidays and had return of afib. He was rate controlled at time of ER visit and d/c home. Now in afib clinic he is back in [name]. He converted about 2 hours after returning home from the ER. The ER MD suggested to go up on daily BB but this made him feel lightheaded. He has not had anymore alcohol and is writing this off. He continues on Multaq. He feels the holiday alcohol use was his trigger for recent afib episode.  Follow up in the AF clinic 03/20/19. Patient reports that on 03/16/19 he felt lightheaded and check his pulse oximeter which showed a rate in the 130s. He doubled his BB dose which resolved his symptoms. He remains in rate controlled afib today. Patient admits that the night  before, he had a glass of wine and 3-4 beers. Alcohol has been a trigger for his afib in the past. He is on Eliquis for a CHADS2VASC score of 2, he denies any missed doses of anticoagulation.   Today, he denies symptoms of palpitations, chest pain, shortness of breath, orthopnea, PND, lower extremity edema, presyncope, syncope, or neurologic sequela. The patient is tolerating medications without difficulties and is otherwise without complaint today.   Past Medical History:  Diagnosis Date  . Allergy   . Atrial fibrillation ([place]) 08/04/2015   CHA2DS2-VASc = 3  . CHF (congestive heart failure) ([place]) 2004   one episode  . Dehydration 08/04/2015  . Diabetes mellitus, type 2 ([place]) 11/29/2011  . Dyslipidemia 08/04/2015  . Essential hypertension 08/04/2015  . Hx of sleep apnea   . Hyperlipidemia   . Hypertension   . Normal coronary arteries 2004  . Obesity 11/29/2011  . Sleep apnea-untreated 12/16/2011   Pt denies having OSA when asked today   Past Surgical History:  Procedure Laterality Date  . CARDIAC CATHETERIZATION  2005  . CARDIOVERSION N/A 08/05/2015   Procedure: CARDIOVERSION;  Surgeon: Jerline Pain, MD;  Location: [facility];  Service: Cardiovascular;  Laterality: N/A;  . CARDIOVERSION N/A 07/19/2017   Procedure: CARDIOVERSION;  Surgeon: Thayer Headings, MD;  Location: [place];  Service: Cardiovascular;  Laterality: N/A;  . HERNIA REPAIR    . TEE WITHOUT CARDIOVERSION N/A 08/05/2015   Procedure: TRANSESOPHAGEAL ECHOCARDIOGRAM (TEE);  Surgeon: Jerline Pain, MD;  [facility] ENDOSCOPY;  Service: Cardiovascular;  Laterality: N/A;    Current Outpatient Medications  Medication Sig Dispense Refill  . acetaminophen (TYLENOL) 500 MG tablet Take 500 mg by mouth every 6 (six) hours as needed for moderate pain or headache.    Marland Kitchen apixaban (ELIQUIS) 5 MG TABS tablet Take 1 tablet (5 mg total) by mouth 2 (two) times daily. 180 tablet 3  . dronedarone (MULTAQ) 400 MG tablet TAKE 1 TABLET BY  MOUTH TWICE DAILY WITH A MEAL 60 tablet 11  . glipiZIDE (GLUCOTROL XL) 10 MG 24 hr tablet Take 1 tablet (10 mg total) by mouth 2 (two) times daily. 60 tablet 3  . ipratropium (ATROVENT) 0.03 % nasal spray ipratropium bromide 21 mcg (0.03 %) nasal spray    . JARDIANCE 25 MG TABS tablet Take 25 mg by mouth daily.  6  . lisinopril (ZESTRIL) 2.5 MG tablet Take 1 tablet (2.5 mg total) by mouth daily. 90 tablet 3  . metFORMIN (GLUCOPHAGE) 1000 MG tablet Take 1 tablet (1,000 mg total) by mouth 2 (two) times daily with a meal. 180 tablet 3  . metoprolol  succinate (TOPROL-XL) 25 MG 24 hr tablet Take 1 tablet (25 mg total) by mouth 2 (two) times daily. May take an extra tablet as needed for breakthrough 200 tablet 3  . Multiple Vitamins-Minerals (MEGA MULTI MEN) TABS Take 1 tablet by mouth daily.    . sildenafil (VIAGRA) 100 MG tablet Take 0.5-1 tablets (50-100 mg total) by mouth daily as needed for erectile dysfunction. 5 tablet 11   No current facility-administered medications for this encounter.    No Known Allergies  Social History   Socioeconomic History  . Marital status: Married    Spouse name: Not on file  . Number of children: Not on file  . Years of education: Not on file  . Highest education level: Not on file  Occupational History  . Not on file  Tobacco Use  . Smoking status: Former Games developer  . Smokeless tobacco: Never Used  Substance and Sexual Activity  . Alcohol use: Yes    Alcohol/week: 4.0 standard drinks    Types: 2 Cans of beer, 2 Glasses of wine per week    Comment: occ  . Drug use: No  . Sexual activity: Yes  Other Topics Concern  . 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   Social Determinants of Health   Financial Resource Strain:   . Difficulty of Paying Living Expenses: Not on file  Food Insecurity:   . Worried About Programme researcher, broadcasting/film/video in the Last Year: Not on file  . Ran Out of Food  in the Last Year: Not on file  Transportation Needs:   . Lack of Transportation (Medical): Not on file  . Lack of Transportation (Non-Medical): Not on file  Physical Activity:   . Days of Exercise per Week: Not on file  . Minutes of Exercise per Session: Not on file  Stress:   . Feeling of Stress : Not on file  Social Connections:   . Frequency of Communication with Friends and Family: Not on file  . Frequency of Social Gatherings with Friends and Family: Not on file  . Attends Religious Services: Not on file  . Active Member of Clubs or Organizations: Not on file  . Attends Banker Meetings: Not on file  . Marital Status: Not on file  Intimate Partner Violence:   . Fear of Current or Ex-Partner: Not on file  . Emotionally Abused: Not on file  . Physically Abused: Not on file  . Sexually Abused: Not on file    Family History  Problem Relation Age of Onset  . Diabetes Mother     ROS- All systems are reviewed and negative except as per the HPI above  Physical Exam: Vitals:   03/20/19 0832  BP: 118/82  Pulse: 96  Weight: (!) 139.4 kg  Height: 5\' 9"  (1.753 m)    GEN- The patient is well appearing obese male, alert and oriented x 3 today.   HEENT-head normocephalic, atraumatic, sclera clear, conjunctiva pink, hearing intact, trachea midline. Lungs- Clear to ausculation bilaterally, normal work of breathing Heart- irregular rate and rhythm, no murmurs, rubs or gallops  GI- soft, NT, ND, + BS Extremities- no clubbing, cyanosis, or edema MS- no significant deformity or atrophy Skin- no rash or lesion Psych- euthymic mood, full affect Neuro- strength and sensation are intact   EKG- afib HR 96, RAD, slow R wave prog, QRS 86, QTc 439  Echo 11/13/17  demonstrated  Left ventricle: The cavity size was mildly dilated. Systolic  function was normal. The estimated ejection fraction was in the  range of 60% to 65%. Wall motion was normal; there were no    regional wall motion abnormalities. Left ventricular diastolic  function parameters were normal.  - Aortic valve: Transvalvular velocity was within the normal range.  There was no stenosis. There was no regurgitation.  - Mitral valve: Transvalvular velocity was within the normal range.  There was no evidence for stenosis. There was trivial  regurgitation. Valve area by pressure half-time: 1.95 cm^2. Valve  area by continuity equation (using LVOT flow): 2.02 cm^2.  - Left atrium: The atrium was severely dilated.  - Right ventricle: The cavity size was normal. Wall thickness was  normal. Systolic function was normal.  - Atrial septum: No defect or patent foramen ovale was identified  by color flow Doppler.  - Tricuspid valve: There was no regurgitation.   Epic records reviewed  Assessment and Plan: 1. Paroxysmal atrial fibrillation Patient in afib since 2/20, likely triggered by alcohol use. We discussed lifestyle modification including alcohol cessation in detail today. Will arrange for DCCV.  Continue increased dose of Toprol 50 mg BID until DCCV, then decrease back to 25 mg BID dosing.  Continue Multaq 400 mg BID Continue Eliquis 5 mg BID  This patients CHA2DS2-VASc Score and unadjusted Ischemic Stroke Rate (% per year) is equal to 2.2 % stroke rate/year from a score of 2  Above score calculated as 1 point each if present [CHF, HTN, DM, Vascular=MI/PAD/Aortic Plaque, Age if 65-74, or Male] Above score calculated as 2 points each if present [Age > 75, or Stroke/TIA/TE]   2. HTN Stable, no changes today.  3. OSA The importance of adequate treatment of sleep apnea was discussed today in order to improve our ability to maintain sinus rhythm long term. Patient not on CPAP therapy.  4. Obesity Body mass index is 45.4 kg/m. Lifestyle modification was discussed and encouraged including regular physical activity and weight reduction.   Follow up in the AF  clinic one week post DCCV.   Ricky Rea Kalama PA-C Afib [facility] [address] , [place] [address] [phone]  

## 2019-03-21 ENCOUNTER — Other Ambulatory Visit (HOSPITAL_COMMUNITY)
Admission: RE | Admit: 2019-03-21 | Discharge: 2019-03-21 | Disposition: A | Discharge status: Home / Self Care | Payer: 59 | Source: Ambulatory Visit | Attending: Cardiology | Admitting: Cardiology

## 2019-03-21 DIAGNOSIS — Z01812 Encounter for preprocedural laboratory examination: Secondary | ICD-10-CM | POA: Insufficient documentation

## 2019-03-21 DIAGNOSIS — Z20822 Contact with and (suspected) exposure to covid-19: Secondary | ICD-10-CM | POA: Diagnosis not present

## 2019-03-21 LAB — SARS CORONAVIRUS 2 (TAT 6-24 HRS): SARS Coronavirus 2: NEGATIVE

## 2019-03-22 ENCOUNTER — Other Ambulatory Visit (HOSPITAL_COMMUNITY): Payer: 59

## 2019-03-25 ENCOUNTER — Other Ambulatory Visit: Payer: Self-pay

## 2019-03-25 ENCOUNTER — Ambulatory Visit (HOSPITAL_COMMUNITY)
Admission: RE | Admit: 2019-03-25 | Discharge: 2019-03-25 | Disposition: A | Discharge status: Home / Self Care | Payer: 59 | Attending: Cardiology | Admitting: Cardiology

## 2019-03-25 ENCOUNTER — Encounter (HOSPITAL_COMMUNITY): Payer: Self-pay | Admitting: Cardiology

## 2019-03-25 ENCOUNTER — Ambulatory Visit (HOSPITAL_COMMUNITY): Payer: 59 | Admitting: Certified Registered Nurse Anesthetist

## 2019-03-25 ENCOUNTER — Encounter (HOSPITAL_COMMUNITY)
Admission: RE | Disposition: A | Discharge status: Home / Self Care | Payer: Self-pay | Source: Home / Self Care | Attending: Cardiology

## 2019-03-25 DIAGNOSIS — Z833 Family history of diabetes mellitus: Secondary | ICD-10-CM | POA: Insufficient documentation

## 2019-03-25 DIAGNOSIS — Z7984 Long term (current) use of oral hypoglycemic drugs: Secondary | ICD-10-CM | POA: Insufficient documentation

## 2019-03-25 DIAGNOSIS — E785 Hyperlipidemia, unspecified: Secondary | ICD-10-CM | POA: Insufficient documentation

## 2019-03-25 DIAGNOSIS — Z6841 Body Mass Index (BMI) 40.0 and over, adult: Secondary | ICD-10-CM | POA: Diagnosis not present

## 2019-03-25 DIAGNOSIS — E669 Obesity, unspecified: Secondary | ICD-10-CM | POA: Insufficient documentation

## 2019-03-25 DIAGNOSIS — I480 Paroxysmal atrial fibrillation: Secondary | ICD-10-CM | POA: Diagnosis not present

## 2019-03-25 DIAGNOSIS — Z7901 Long term (current) use of anticoagulants: Secondary | ICD-10-CM | POA: Diagnosis not present

## 2019-03-25 DIAGNOSIS — E119 Type 2 diabetes mellitus without complications: Secondary | ICD-10-CM | POA: Insufficient documentation

## 2019-03-25 DIAGNOSIS — Z79899 Other long term (current) drug therapy: Secondary | ICD-10-CM | POA: Insufficient documentation

## 2019-03-25 DIAGNOSIS — Z8679 Personal history of other diseases of the circulatory system: Secondary | ICD-10-CM | POA: Insufficient documentation

## 2019-03-25 DIAGNOSIS — Z87891 Personal history of nicotine dependence: Secondary | ICD-10-CM | POA: Diagnosis not present

## 2019-03-25 DIAGNOSIS — G4733 Obstructive sleep apnea (adult) (pediatric): Secondary | ICD-10-CM | POA: Insufficient documentation

## 2019-03-25 HISTORY — PX: CARDIOVERSION: SHX1299

## 2019-03-25 LAB — GLUCOSE, CAPILLARY: Glucose-Capillary: 154 mg/dL — ABNORMAL HIGH (ref 70–99)

## 2019-03-25 SURGERY — CARDIOVERSION
Anesthesia: General

## 2019-03-25 MED ORDER — ONDANSETRON HCL 4 MG/2ML IJ SOLN: 4.0000 mg | Freq: Once | INTRAMUSCULAR | Status: DC | PRN

## 2019-03-25 MED ORDER — SODIUM CHLORIDE 0.9 % IV SOLN
INTRAVENOUS | Status: AC | PRN
  Administered 2019-03-25: 500 mL via INTRAVENOUS

## 2019-03-25 MED ORDER — PROPOFOL 10 MG/ML IV BOLUS
INTRAVENOUS | Status: DC | PRN
  Administered 2019-03-25: 70 mg via INTRAVENOUS
  Administered 2019-03-25: 20 mg via INTRAVENOUS

## 2019-03-25 MED ORDER — SODIUM CHLORIDE 0.9 % IV SOLN: INTRAVENOUS | Status: DC | PRN

## 2019-03-25 MED ORDER — LIDOCAINE 2% (20 MG/ML) 5 ML SYRINGE
INTRAMUSCULAR | Status: DC | PRN
  Administered 2019-03-25: 80 mg via INTRAVENOUS

## 2019-03-25 MED ORDER — FENTANYL CITRATE (PF) 100 MCG/2ML IJ SOLN: 25.0000 ug | INTRAMUSCULAR | Status: DC | PRN

## 2019-03-25 NOTE — Interval H&P Note (Signed)
History and Physical Interval Note:  03/25/2019 9:17 AM  Juan Schwartz  has presented today for surgery, with the diagnosis of A-FIB.  The various methods of treatment have been discussed with the patient and family. After consideration of risks, benefits and other options for treatment, the patient has consented to  Procedure(s): CARDIOVERSION (N/A) as a surgical intervention.  The patient's history has been reviewed, patient examined, no change in status, stable for surgery.  I have reviewed the patient's chart and labs.  Questions were answered to the patient's satisfaction.     [employer]

## 2019-03-25 NOTE — Anesthesia Preprocedure Evaluation (Signed)
Anesthesia Evaluation  Patient identified by MRN, date of birth, ID band Patient awake    Reviewed: Allergy & Precautions, NPO status , Patient's Chart, lab work & pertinent test results, reviewed documented beta blocker date and time   Airway Mallampati: II  TM Distance: >3 FB Neck ROM: Full    Dental  (+) Dental Advisory Given, Missing   Pulmonary sleep apnea , former smoker,    Pulmonary exam normal breath sounds clear to auscultation       Cardiovascular hypertension, Pt. on medications and Pt. on home beta blockers +CHF  + dysrhythmias Atrial Fibrillation  Rhythm:Irregular Rate:Abnormal     Neuro/Psych negative neurological ROS  negative psych ROS   GI/Hepatic negative GI ROS,   Endo/Other  diabetes, Type 2, Oral Hypoglycemic AgentsMorbid obesity  Renal/GU negative Renal ROS     Musculoskeletal negative musculoskeletal ROS (+)   Abdominal   Peds  Hematology  (+) Blood dyscrasia (Eliquis), ,   Anesthesia Other Findings Day of surgery medications reviewed with the patient.  Reproductive/Obstetrics                             Anesthesia Physical Anesthesia Plan  ASA: III  Anesthesia Plan: General   Post-op Pain Management:    Induction: Intravenous  PONV Risk Score and Plan: 2 and Treatment may vary due to age or medical condition  Airway Management Planned: Mask  Additional Equipment:   Intra-op Plan:   Post-operative Plan:   Informed Consent: I have reviewed the patients History and Physical, chart, labs and discussed the procedure including the risks, benefits and alternatives for the proposed anesthesia with the patient or authorized representative who has indicated his/her understanding and acceptance.     Dental advisory given  Plan Discussed with: CRNA  Anesthesia Plan Comments:         Anesthesia Quick Evaluation

## 2019-03-25 NOTE — Anesthesia Postprocedure Evaluation (Signed)
Anesthesia Post Note  Patient: Juan Schwartz  Procedure(s) Performed: CARDIOVERSION (N/A )     Patient location during evaluation: PACU Anesthesia Type: General Level of consciousness: awake and alert Pain management: pain level controlled Vital Signs Assessment: post-procedure vital signs reviewed and stable Respiratory status: spontaneous breathing, nonlabored ventilation and respiratory function stable Cardiovascular status: blood pressure returned to baseline and stable Postop Assessment: no apparent nausea or vomiting Anesthetic complications: no    Last Vitals:  Vitals:   03/25/19 1000 03/25/19 1007  BP: (!) 84/54 (!) 87/53  Pulse: 72 71  Resp: 15 14  Temp:    SpO2: 100% 100%    Last Pain:  Vitals:   03/25/19 0955  TempSrc: Oral  PainSc: 0-No pain                 Cecile Hearing

## 2019-03-25 NOTE — CV Procedure (Signed)
    Electrical Cardioversion Procedure Note Juan Schwartz [id number] February 23, 1965  Procedure: Electrical Cardioversion Indications:  Atrial Fibrillation  Time Out: Verified patient identification, verified procedure,medications/allergies/relevent history reviewed, required imaging and test results available.  Performed  Procedure Details  The patient was NPO after midnight. Anesthesia was administered at the beside  by Dr. Desmond Lope with propofol.  Cardioversion was performed with synchronized biphasic defibrillation via AP pads with 120, 200 joules.  2 attempt(s) were performed.  The patient converted to normal sinus rhythm. The patient tolerated the procedure well   IMPRESSION:  Successful cardioversion of atrial fibrillation on [employer] 03/25/2019, 9:47 AM

## 2019-03-25 NOTE — Anesthesia Procedure Notes (Signed)
Procedure Name: General with mask airway Date/Time: 03/25/2019 9:40 AM Performed by: Nils Pyle, CRNA Pre-anesthesia Checklist: Patient identified, Emergency Drugs available, Suction available and Patient being monitored Patient Re-evaluated:Patient Re-evaluated prior to induction Oxygen Delivery Method: Ambu bag Preoxygenation: Pre-oxygenation with 100% oxygen Induction Type: IV induction Placement Confirmation: positive ETCO2 and breath sounds checked- equal and bilateral Dental Injury: Teeth and Oropharynx as per pre-operative assessment

## 2019-03-25 NOTE — Discharge Instructions (Signed)
Electrical Cardioversion Electrical cardioversion is the delivery of a jolt of electricity to restore a normal rhythm to the heart. A rhythm that is too fast or is not regular keeps the heart from pumping well. In this procedure, sticky patches or metal paddles are placed on the chest to deliver electricity to the heart from a device. This procedure may be done in an emergency if:  There is low or no blood pressure as a result of the heart rhythm.  Normal rhythm must be restored as fast as possible to protect the brain and heart from further damage.  It may save a life. This may also be a scheduled procedure for irregular or fast heart rhythms that are not immediately life-threatening. Tell a health care provider about:  Any allergies you have.  All medicines you are taking, including vitamins, herbs, eye drops, creams, and over-the-counter medicines.  Any problems you or family members have had with anesthetic medicines.  Any blood disorders you have.  Any surgeries you have had.  Any medical conditions you have.  Whether you are pregnant or may be pregnant. What are the risks? Generally, this is a safe procedure. However, problems may occur, including:  Allergic reactions to medicines.  A blood clot that breaks free and travels to other parts of your body.  The possible return of an abnormal heart rhythm within hours or days after the procedure.  Your heart stopping (cardiac arrest). This is rare. What happens before the procedure? Medicines  Your health care provider may have you start taking: ? Blood-thinning medicines (anticoagulants) so your blood does not clot as easily. ? Medicines to help stabilize your heart rate and rhythm.  Ask your health care provider about: ? Changing or stopping your regular medicines. This is especially important if you are taking diabetes medicines or blood thinners. ? Taking medicines such as aspirin and ibuprofen. These medicines can  thin your blood. Do not take these medicines unless your health care provider tells you to take them. ? Taking over-the-counter medicines, vitamins, herbs, and supplements. General instructions  Follow instructions from your health care provider about eating or drinking restrictions.  Plan to have someone take you home from the hospital or clinic.  If you will be going home right after the procedure, plan to have someone with you for 24 hours.  Ask your health care provider what steps will be taken to help prevent infection. These may include washing your skin with a germ-killing soap. What happens during the procedure?   An IV will be inserted into one of your veins.  Sticky patches (electrodes) or metal paddles may be placed on your chest.  You will be given a medicine to help you relax (sedative).  An electrical shock will be delivered. The procedure may vary among health care providers and hospitals. What can I expect after the procedure?  Your blood pressure, heart rate, breathing rate, and blood oxygen level will be monitored until you leave the hospital or clinic.  Your heart rhythm will be watched to make sure it does not change.  You may have some redness on the skin where the shocks were given. Follow these instructions at home:  Do not drive for 24 hours if you were given a sedative during your procedure.  Take over-the-counter and prescription medicines only as told by your health care provider.  Ask your health care provider how to check your pulse. Check it often.  Rest for 48 hours after the procedure or   as told by your health care provider.  Avoid or limit your caffeine use as told by your health care provider.  Keep all follow-up visits as told by your health care provider. This is important. Contact a health care provider if:  You feel like your heart is beating too quickly or your pulse is not regular.  You have a serious muscle cramp that does not go  away. Get help right away if:  You have discomfort in your chest.  You are dizzy or you feel faint.  You have trouble breathing or you are short of breath.  Your speech is slurred.  You have trouble moving an arm or leg on one side of your body.  Your fingers or toes turn cold or blue. Summary  Electrical cardioversion is the delivery of a jolt of electricity to restore a normal rhythm to the heart.  This procedure may be done right away in an emergency or may be a scheduled procedure if the condition is not an emergency.  Generally, this is a safe procedure.  After the procedure, check your pulse often as told by your health care provider. This information is not intended to replace advice given to you by your health care provider. Make sure you discuss any questions you have with your health care provider. Document Revised: 08/13/2018 Document Reviewed: 08/13/2018 Elsevier Patient Education  2020 [employer] Inc[employer]  

## 2019-03-25 NOTE — Transfer of Care (Signed)
Immediate Anesthesia Transfer of Care Note  Patient: Arch OVERTON BOGGUS  Procedure(s) Performed: CARDIOVERSION (N/A )  Patient Location: Endoscopy Unit  Anesthesia Type:General  Level of Consciousness: awake, alert  and oriented  Airway & Oxygen Therapy: Patient Spontanous Breathing  Post-op Assessment: Report given to RN, Post -op Vital signs reviewed and stable and Patient moving all extremities X 4  Post vital signs: Reviewed and stable  Last Vitals:  Vitals Value Taken Time  BP    Temp    Pulse    Resp    SpO2      Last Pain:  Vitals:   03/25/19 0916  TempSrc: Oral  PainSc: 0-No pain         Complications: No apparent anesthesia complications

## 2019-04-01 ENCOUNTER — Other Ambulatory Visit: Payer: Self-pay

## 2019-04-01 ENCOUNTER — Ambulatory Visit (HOSPITAL_COMMUNITY)
Admission: RE | Admit: 2019-04-01 | Discharge: 2019-04-01 | Disposition: A | Discharge status: Home / Self Care | Payer: 59 | Source: Ambulatory Visit | Attending: Physician Assistant | Admitting: Physician Assistant

## 2019-04-01 ENCOUNTER — Encounter (HOSPITAL_COMMUNITY): Payer: Self-pay | Admitting: Physician Assistant

## 2019-04-01 VITALS — BP 118/70 | HR 57 | Ht 69.0 in | Wt 307.4 lb

## 2019-04-01 DIAGNOSIS — Z7901 Long term (current) use of anticoagulants: Secondary | ICD-10-CM | POA: Diagnosis not present

## 2019-04-01 DIAGNOSIS — Z833 Family history of diabetes mellitus: Secondary | ICD-10-CM | POA: Diagnosis not present

## 2019-04-01 DIAGNOSIS — Z79899 Other long term (current) drug therapy: Secondary | ICD-10-CM | POA: Diagnosis not present

## 2019-04-01 DIAGNOSIS — E785 Hyperlipidemia, unspecified: Secondary | ICD-10-CM | POA: Insufficient documentation

## 2019-04-01 DIAGNOSIS — G4733 Obstructive sleep apnea (adult) (pediatric): Secondary | ICD-10-CM | POA: Diagnosis not present

## 2019-04-01 DIAGNOSIS — D6869 Other thrombophilia: Secondary | ICD-10-CM

## 2019-04-01 DIAGNOSIS — I480 Paroxysmal atrial fibrillation: Secondary | ICD-10-CM | POA: Diagnosis not present

## 2019-04-01 DIAGNOSIS — E119 Type 2 diabetes mellitus without complications: Secondary | ICD-10-CM | POA: Diagnosis not present

## 2019-04-01 DIAGNOSIS — Z7984 Long term (current) use of oral hypoglycemic drugs: Secondary | ICD-10-CM | POA: Diagnosis not present

## 2019-04-01 DIAGNOSIS — Z87891 Personal history of nicotine dependence: Secondary | ICD-10-CM | POA: Diagnosis not present

## 2019-04-01 DIAGNOSIS — E669 Obesity, unspecified: Secondary | ICD-10-CM | POA: Insufficient documentation

## 2019-04-01 DIAGNOSIS — Z6841 Body Mass Index (BMI) 40.0 and over, adult: Secondary | ICD-10-CM | POA: Insufficient documentation

## 2019-04-01 DIAGNOSIS — I4891 Unspecified atrial fibrillation: Secondary | ICD-10-CM | POA: Diagnosis present

## 2019-04-01 DIAGNOSIS — I10 Essential (primary) hypertension: Secondary | ICD-10-CM | POA: Insufficient documentation

## 2019-04-01 NOTE — Progress Notes (Signed)
Patient ID: Juan Schwartz, male   DOB: Jul 28, 1965, 54 y.o.   MRN: [id number]     Primary Care Physician: Patient, No Pcp Per Referring Physician: [facility] F/U Cardiologist: Dr. Katrinka Blazing Primary EP: Dr Eliezer Lofts Juan Schwartz is a 54 y.o. AA male with a h/o obesity, HTN, untreated sleep apnea, daily ETOH ("2 beers a day"), HLD, and NIDDM. He was seen by cardiology in 2004 when he presented with chest pain and SOB. An echo initially showed an EF of 35%. He was cathed by Dr Clarene Duke and had normal coronaries and an EF of 50%. It was felt he had an episode of CHF then but he has not had any further episodes.   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 He began to get weak and decided to stop for the day. The morning of admit 08/04/15 he went to work (works outside) and as soon as it started to warm up he again felt weak. He denied any chest pain or dyspnea. He denied any palpations. In the ED his EKG shows AF with RVR @ 126, poor ant RW. His B/P is a little low and his BUN was 24 suggesting he is mildly dehydrated. He was admitted and hydrated, IV heparin and dilt. It was noted he had sleep apnea and will need out pt sleep study.   His cardiac enzymes were negative. His TSH was normal. K+and Mg+ normal. He was eventually place on IV amiodarone and underwent TEE and DCCV on 08/05/15. EF was 45% with diffuse hypokinesis Tr MR. DCCV was successful and pt has maintained SR since.   He was seen by Dr. Katrinka Blazing and amiodarone was stopped. Metoprolol was added. His glucose off Metformin was elevated during hospitaltiztion.   Eliquis  added for 4 weeks and no asprin for that time. Plan for out pt sleep study.  F/u in afib clinic, 08/12/16. Pt states that he feels well. No return of afib. He returns to work next Tuesday and he does work outside in the heat. BP soft today at 98/64. He states that BP has been lower since metoprolol was added. He is not symptomatic with this currently.  He does have a sleep study pending. He is being compliant with eliquis and with a a chadsvasc score of at least 2(HYN, DM), it is felt that he should stay on anticoagulant.He has lost 150 lbs over the last couple of years, at one point weighting 468 lbs. He has plateaued with his weight  and plans to return to the gym and restart his walking 1-2 miles 2x a day. We discussed alcohol and afib connection and he has quit alcohol since hospitalization and plans  not drink alcohol going forward.   F/u in afib clinic 05/05/16.He recently was seen by [name], [name], for f/u and reported that he had not felt well recentlyy with more dizziness, fatigue.He was found to be in afib. It was recommended that he undergo a cpap titration trial due to return of afib but sleep study showed only mild sleep apnea last year. Pt states today that he does not want to pursue this. He states that it is all his fault that he went into afib as he had been non compliant with his meds. He was only taking BB occassionally and would often skip his anticoagulant.  He has increased his alcohol intake.He said before he did well with he followed the plan without afib.  F/u ina fib clinic for  recent ER visit 01/31/2018. He had been drinking a lot of beer over the holidays and had return of afib. He was rate controlled at time of ER visit and d/c home. Now in afib clinic he is back in [place]. He converted about 2 hours after returning home from the ER. The ER MD suggested to go up on daily BB but this made him feel lightheaded. He has not had anymore alcohol and is writing this off. He continues on Multaq. He feels the holiday alcohol use was his trigger for recent afib episode.  Follow up in the AF clinic 03/20/19. Patient reports that on 03/16/19 he felt lightheaded and check his pulse oximeter which showed a rate in the 130s. He doubled his BB dose which resolved his symptoms. He remains in rate controlled afib today. Patient admits that the night  before, he had a glass of wine and 3-4 beers. Alcohol has been a trigger for his afib in the past. He is on Eliquis for a CHADS2VASC score of 2, he denies any missed doses of anticoagulation.   Follow up in the AF clinic 04/01/19. Patient is s/p DCCV on 03/25/19. He reports that he feels "100% better" since his procedure. He has not had any further heart racing or palpitations. He has not had any alcohol to drink recently.   Today, he denies symptoms of palpitations, chest pain, shortness of breath, orthopnea, PND, lower extremity edema, presyncope, syncope, or neurologic sequela. The patient is tolerating medications without difficulties and is otherwise without complaint today.   Past Medical History:  Diagnosis Date  . Allergy   . Atrial fibrillation ([place]) 08/04/2015   CHA2DS2-VASc = 3  . CHF (congestive heart failure) ([place]) 2004   one episode  . Dehydration 08/04/2015  . Diabetes mellitus, type 2 ([place]) 11/29/2011  . Dyslipidemia 08/04/2015  . Essential hypertension 08/04/2015  . Hx of sleep apnea   . Hyperlipidemia   . Hypertension   . Normal coronary arteries 2004  . Obesity 11/29/2011  . Sleep apnea-untreated 12/16/2011   Pt denies having OSA when asked today   Past Surgical History:  Procedure Laterality Date  . CARDIAC CATHETERIZATION  2005  . CARDIOVERSION N/A 08/05/2015   Procedure: CARDIOVERSION;  Surgeon: Jerline Pain, MD;  Location: [place];  Service: Cardiovascular;  Laterality: N/A;  . CARDIOVERSION N/A 07/19/2017   Procedure: CARDIOVERSION;  Surgeon: Thayer Headings, MD;  Location: [place];  Service: Cardiovascular;  Laterality: N/A;  . CARDIOVERSION N/A 03/25/2019   Procedure: CARDIOVERSION;  Surgeon: Jerline Pain, MD;  Location: [place];  Service: Cardiovascular;  Laterality: N/A;  . HERNIA REPAIR    . TEE WITHOUT CARDIOVERSION N/A 08/05/2015   Procedure: TRANSESOPHAGEAL ECHOCARDIOGRAM (TEE);  Surgeon: Jerline Pain, MD;  Location: [facility] ENDOSCOPY;  Service:  Cardiovascular;  Laterality: N/A;    Current Outpatient Medications  Medication Sig Dispense Refill  . apixaban (ELIQUIS) 5 MG TABS tablet Take 1 tablet (5 mg total) by mouth 2 (two) times daily. 180 tablet 3  . dronedarone (MULTAQ) 400 MG tablet TAKE 1 TABLET BY MOUTH TWICE DAILY WITH A MEAL (Patient taking differently: Take 400 mg by mouth in the morning and at bedtime. ) 60 tablet 11  . glipiZIDE (GLUCOTROL) 10 MG tablet Take 10 mg by mouth 2 (two) times daily.    Marland Kitchen JARDIANCE 25 MG TABS tablet Take 25 mg by mouth daily.  6  . lisinopril (ZESTRIL) 2.5 MG tablet Take 1 tablet (2.5 mg total)  by mouth daily. 90 tablet 3  . metFORMIN (GLUCOPHAGE) 1000 MG tablet Take 1 tablet (1,000 mg total) by mouth 2 (two) times daily with a meal. (Patient taking differently: Take 500-1,000 mg by mouth See admin instructions. Take 1000 mg daily, may take a second 500 mg dose during the day 3 or 4 times per week) 180 tablet 3  . metoprolol succinate (TOPROL-XL) 25 MG 24 hr tablet Take 1 tablet (25 mg total) by mouth 2 (two) times daily. May take an extra tablet as needed for breakthrough (Patient taking differently: Take 25 mg by mouth See admin instructions. Take 25 mg twice daily, when in afib take 50 mg twice daily) 200 tablet 3  . sildenafil (VIAGRA) 100 MG tablet Take 0.5-1 tablets (50-100 mg total) by mouth daily as needed for erectile dysfunction. 5 tablet 11   No current facility-administered medications for this encounter.    No Known Allergies  Social History   Socioeconomic History  . Marital status: Married    Spouse name: Not on file  . Number of children: Not on file  . Years of education: Not on file  . Highest education level: Not on file  Occupational History  . Not on file  Tobacco Use  . Smoking status: Former Games developer  . Smokeless tobacco: Never Used  Substance and Sexual Activity  . Alcohol use: Yes    Alcohol/week: 4.0 standard drinks    Types: 2 Cans of beer, 2 Glasses of wine  per week    Comment: occ  . Drug use: No  . Sexual activity: Yes  Other Topics Concern  . 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   Social Determinants of Health   Financial Resource Strain:   . Difficulty of Paying Living Expenses: Not on file  Food Insecurity:   . Worried About Programme researcher, broadcasting/film/video in the Last Year: Not on file  . Ran Out of Food in the Last Year: Not on file  Transportation Needs:   . Lack of Transportation (Medical): Not on file  . Lack of Transportation (Non-Medical): Not on file  Physical Activity:   . Days of Exercise per Week: Not on file  . Minutes of Exercise per Session: Not on file  Stress:   . Feeling of Stress : Not on file  Social Connections:   . Frequency of Communication with Friends and Family: Not on file  . Frequency of Social Gatherings with Friends and Family: Not on file  . Attends Religious Services: Not on file  . Active Member of Clubs or Organizations: Not on file  . Attends Banker Meetings: Not on file  . Marital Status: Not on file  Intimate Partner Violence:   . Fear of Current or Ex-Partner: Not on file  . Emotionally Abused: Not on file  . Physically Abused: Not on file  . Sexually Abused: Not on file    Family History  Problem Relation Age of Onset  . Diabetes Mother     ROS- All systems are reviewed and negative except as per the HPI above  Physical Exam: Vitals:   04/01/19 0839  BP: 118/70  Pulse: (!) 57  Weight: (!) 139.4 kg  Height: 5\' 9"  (1.753 m)    GEN- The patient is well appearing obese male, alert and oriented x 3 today.   HEENT-head normocephalic, atraumatic, sclera clear, conjunctiva pink, hearing intact,  trachea midline. Lungs- Clear to ausculation bilaterally, normal work of breathing Heart- Regular rate and rhythm, no murmurs, rubs or gallops  GI- soft, NT, ND, + BS Extremities- no clubbing, cyanosis, or  edema MS- no significant deformity or atrophy Skin- no rash or lesion Psych- euthymic mood, full affect Neuro- strength and sensation are intact   EKG- SB HR 57, LPFB, PR 190, QRS 84, QTc 496  Echo 11/13/17 demonstrated  Left ventricle: The cavity size was mildly dilated. Systolic  function was normal. The estimated ejection fraction was in the  range of 60% to 65%. Wall motion was normal; there were no  regional wall motion abnormalities. Left ventricular diastolic  function parameters were normal.  - Aortic valve: Transvalvular velocity was within the normal range.  There was no stenosis. There was no regurgitation.  - Mitral valve: Transvalvular velocity was within the normal range.  There was no evidence for stenosis. There was trivial  regurgitation. Valve area by pressure half-time: 1.95 cm^2. Valve  area by continuity equation (using LVOT flow): 2.02 cm^2.  - Left atrium: The atrium was severely dilated.  - Right ventricle: The cavity size was normal. Wall thickness was  normal. Systolic function was normal.  - Atrial septum: No defect or patent foramen ovale was identified  by color flow Doppler.  - Tricuspid valve: There was no regurgitation.   Epic records reviewed  Assessment and Plan: 1. Paroxysmal atrial fibrillation S/p DCCV  On 03/25/19 Patient appears to be maintaining SR. Toprol 25 mg BID Continue Multaq 400 mg BID Continue Eliquis 5 mg BID Lifestyle modification including alcohol cessation.  This patients CHA2DS2-VASc Score and unadjusted Ischemic Stroke Rate (% per year) is equal to 2.2 % stroke rate/year from a score of 2  Above score calculated as 1 point each if present [CHF, HTN, DM, Vascular=MI/PAD/Aortic Plaque, Age if 65-74, or Male] Above score calculated as 2 points each if present [Age > 75, or Stroke/TIA/TE]   2. HTN Stable, no changes today.  3. OSA The importance of adequate treatment of sleep apnea was discussed  today in order to improve our ability to maintain sinus rhythm long term. Encouraged CPAP use.  4. Obesity Body mass index is 45.4 kg/m. Lifestyle modification was discussed and encouraged including regular physical activity and weight reduction.   Follow up in the AF clinic in 4 months.   Jorja Loa PA-C [facility] [address] [place], [place] [address] [phone]

## 2019-04-29 ENCOUNTER — Encounter (HOSPITAL_COMMUNITY): Payer: Self-pay | Admitting: Physician Assistant

## 2019-04-29 ENCOUNTER — Other Ambulatory Visit: Payer: Self-pay

## 2019-04-29 ENCOUNTER — Ambulatory Visit (HOSPITAL_COMMUNITY)
Admission: RE | Admit: 2019-04-29 | Discharge: 2019-04-29 | Disposition: A | Discharge status: Home / Self Care | Payer: 59 | Source: Ambulatory Visit | Attending: Physician Assistant | Admitting: Physician Assistant

## 2019-04-29 VITALS — BP 110/74 | HR 110 | Ht 69.0 in | Wt 310.4 lb

## 2019-04-29 DIAGNOSIS — Z6841 Body Mass Index (BMI) 40.0 and over, adult: Secondary | ICD-10-CM | POA: Diagnosis not present

## 2019-04-29 DIAGNOSIS — D6869 Other thrombophilia: Secondary | ICD-10-CM | POA: Diagnosis not present

## 2019-04-29 DIAGNOSIS — I110 Hypertensive heart disease with heart failure: Secondary | ICD-10-CM | POA: Diagnosis not present

## 2019-04-29 DIAGNOSIS — I509 Heart failure, unspecified: Secondary | ICD-10-CM | POA: Insufficient documentation

## 2019-04-29 DIAGNOSIS — Z7901 Long term (current) use of anticoagulants: Secondary | ICD-10-CM | POA: Diagnosis not present

## 2019-04-29 DIAGNOSIS — E785 Hyperlipidemia, unspecified: Secondary | ICD-10-CM | POA: Diagnosis not present

## 2019-04-29 DIAGNOSIS — I4819 Other persistent atrial fibrillation: Secondary | ICD-10-CM | POA: Diagnosis present

## 2019-04-29 DIAGNOSIS — E119 Type 2 diabetes mellitus without complications: Secondary | ICD-10-CM | POA: Insufficient documentation

## 2019-04-29 DIAGNOSIS — E669 Obesity, unspecified: Secondary | ICD-10-CM | POA: Diagnosis not present

## 2019-04-29 DIAGNOSIS — G4733 Obstructive sleep apnea (adult) (pediatric): Secondary | ICD-10-CM | POA: Diagnosis not present

## 2019-04-29 DIAGNOSIS — Z79899 Other long term (current) drug therapy: Secondary | ICD-10-CM | POA: Insufficient documentation

## 2019-04-29 DIAGNOSIS — Z87891 Personal history of nicotine dependence: Secondary | ICD-10-CM | POA: Insufficient documentation

## 2019-04-29 DIAGNOSIS — Z7984 Long term (current) use of oral hypoglycemic drugs: Secondary | ICD-10-CM | POA: Diagnosis not present

## 2019-04-29 MED ORDER — FLECAINIDE ACETATE 50 MG PO TABS: 50.0000 mg | ORAL_TABLET | Freq: Two times a day (BID) | ORAL | 3 refills | Status: DC

## 2019-04-29 NOTE — Progress Notes (Signed)
Patient ID: Juan Schwartz, male   DOB: Jul 28, 1965, 54 y.o.   MRN: [id number]     Primary Care Physician: Patient, No Pcp Per Referring Physician: [facility] F/U Cardiologist: Dr. Katrinka Schwartz Primary EP: Dr Juan Schwartz is a 54 y.o. AA male with a h/o obesity, HTN, untreated sleep apnea, daily ETOH ("2 beers a day"), HLD, and NIDDM. He was seen by cardiology in 2004 when he presented with chest pain and SOB. An echo initially showed an EF of 35%. He was cathed by Dr Clarene Duke and had normal coronaries and an EF of 50%. It was felt he had an episode of CHF then but he has not had any further episodes.   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 He began to get weak and decided to stop for the day. The morning of admit 08/04/15 he went to work (works outside) and as soon as it started to warm up he again felt weak. He denied any chest pain or dyspnea. He denied any palpations. In the ED his EKG shows AF with RVR @ 126, poor ant RW. His B/P is a little low and his BUN was 24 suggesting he is mildly dehydrated. He was admitted and hydrated, IV heparin and dilt. It was noted he had sleep apnea and will need out pt sleep study.   His cardiac enzymes were negative. His TSH was normal. K+and Mg+ normal. He was eventually place on IV amiodarone and underwent TEE and DCCV on 08/05/15. EF was 45% with diffuse hypokinesis Tr MR. DCCV was successful and pt has maintained SR since.   He was seen by Dr. Katrinka Schwartz and amiodarone was stopped. Metoprolol was added. His glucose off Metformin was elevated during hospitaltiztion.   Eliquis  added for 4 weeks and no asprin for that time. Plan for out pt sleep study.  F/u in afib clinic, 08/12/16. Pt states that he feels well. No return of afib. He returns to work next Tuesday and he does work outside in the heat. BP soft today at 98/64. He states that BP has been lower since metoprolol was added. He is not symptomatic with this currently.  He does have a sleep study pending. He is being compliant with eliquis and with a a chadsvasc score of at least 2(HYN, DM), it is felt that he should stay on anticoagulant.He has lost 150 lbs over the last couple of years, at one point weighting 468 lbs. He has plateaued with his weight  and plans to return to the gym and restart his walking 1-2 miles 2x a day. We discussed alcohol and afib connection and he has quit alcohol since hospitalization and plans  not drink alcohol going forward.   F/u in afib clinic 05/05/16.He recently was seen by [name], [name], for f/u and reported that he had not felt well recentlyy with more dizziness, fatigue.He was found to be in afib. It was recommended that he undergo a cpap titration trial due to return of afib but sleep study showed only mild sleep apnea last year. Pt states today that he does not want to pursue this. He states that it is all his fault that he went into afib as he had been non compliant with his meds. He was only taking BB occassionally and would often skip his anticoagulant.  He has increased his alcohol intake.He said before he did well with he followed the plan without afib.  F/u ina fib clinic for  recent ER visit 01/31/2018. He had been drinking a lot of beer over the holidays and had return of afib. He was rate controlled at time of ER visit and d/c home. Now in afib clinic he is back in SR. He converted about 2 hours after returning home from the ER. The ER MD suggested to go up on daily BB but this made him feel lightheaded. He has not had anymore alcohol and is writing this off. He continues on Multaq. He feels the holiday alcohol use was his trigger for recent afib episode.  Follow up in the AF clinic 03/20/19. Patient reports that on 03/16/19 he felt lightheaded and check his pulse oximeter which showed a rate in the 130s. He doubled his BB dose which resolved his symptoms. He remains in rate controlled afib today. Patient admits that the night  before, he had a glass of wine and 3-4 beers. Alcohol has been a trigger for his afib in the past. He is on Eliquis for a CHADS2VASC score of 2, he denies any missed doses of anticoagulation.   Follow up in the AF clinic 04/01/19. Patient is s/p DCCV on 03/25/19. He reports that he feels "100% better" since his procedure. He has not had any further heart racing or palpitations. He has not had any alcohol to drink recently.   Follow up in the AF clinic 04/29/19. Patient reports that on 04/26/19 he started having symptoms of lightheadedness and palpitations which has been typical of his afib symptoms in the past. He double his BB with good rate control. He denies specific triggers, he has had very little alcohol. He does admit he has gained about 30-40 lbs during the COVID pandemic.   Today, he denies symptoms of chest pain, shortness of breath, orthopnea, PND, lower extremity edema, presyncope, syncope, or neurologic sequela. The patient is tolerating medications without difficulties and is otherwise without complaint today.   Past Medical History:  Diagnosis Date  . Allergy   . Atrial fibrillation (HCC) 08/04/2015   CHA2DS2-VASc = 3  . CHF (congestive heart failure) (HCC) 2004   one episode  . Dehydration 08/04/2015  . Diabetes mellitus, type 2 (HCC) 11/29/2011  . Dyslipidemia 08/04/2015  . Essential hypertension 08/04/2015  . Hx of sleep apnea   . Hyperlipidemia   . Hypertension   . Normal coronary arteries 2004  . Obesity 11/29/2011  . Sleep apnea-untreated 12/16/2011   Pt denies having OSA when asked today   Past Surgical History:  Procedure Laterality Date  . CARDIAC CATHETERIZATION  2005  . CARDIOVERSION N/A 08/05/2015   Procedure: CARDIOVERSION;  Surgeon: Jake Bathe, MD;  Location[facility] ENDOSCOPY;  Service: Cardiovascular;  Laterality: N/A;  . CARDIOVERSION N/A 07/19/2017   Procedure: CARDIOVERSION;  Surgeon: Vesta Mixer, MD;  Location: [facility] ENDOSCOPY;  Service: Cardiovascular;   Laterality: N/A;  . CARDIOVERSION N/A 03/25/2019   Procedure: CARDIOVERSION;  Surgeon: Jake Bathe, MD;  Location: [facility] ENDOSCOPY;  Service: Cardiovascular;  Laterality: N/A;  . HERNIA REPAIR    . TEE WITHOUT CARDIOVERSION N/A 08/05/2015   Procedure: TRANSESOPHAGEAL ECHOCARDIOGRAM (TEE);  Surgeon: Jake Bathe, MD;  Location: [facility] ENDOSCOPY;  Service: Cardiovascular;  Laterality: N/A;    Current Outpatient Medications  Medication Sig Dispense Refill  . apixaban (ELIQUIS) 5 MG TABS tablet Take 1 tablet (5 mg total) by mouth 2 (two) times daily. 180 tablet 3  . glipiZIDE (GLUCOTROL) 10 MG tablet Take 10 mg by mouth 2 (two) times daily.    Marland Kitchen  JARDIANCE 25 MG TABS tablet Take 25 mg by mouth daily.  6  . lisinopril (ZESTRIL) 2.5 MG tablet Take 1 tablet (2.5 mg total) by mouth daily. 90 tablet 3  . metFORMIN (GLUCOPHAGE) 1000 MG tablet Take 1 tablet (1,000 mg total) by mouth 2 (two) times daily with a meal. (Patient taking differently: Take 500-1,000 mg by mouth See admin instructions. Take 1000 mg daily, may take a second 500 mg dose during the day 3 or 4 times per week) 180 tablet 3  . metoprolol succinate (TOPROL-XL) 25 MG 24 hr tablet Take 1 tablet (25 mg total) by mouth 2 (two) times daily. May take an extra tablet as needed for breakthrough (Patient taking differently: Take 25 mg by mouth See admin instructions. Take 25 mg twice daily, when in afib take 50 mg twice daily) 200 tablet 3  . sildenafil (VIAGRA) 100 MG tablet Take 0.5-1 tablets (50-100 mg total) by mouth daily as needed for erectile dysfunction. 5 tablet 11  . [START ON 05/03/2019] flecainide (TAMBOCOR) 50 MG tablet Take 1 tablet (50 mg total) by mouth 2 (two) times daily. 60 tablet 3   No current facility-administered medications for this encounter.    No Known Allergies  Social History   Socioeconomic History  . Marital status: Married    Spouse name: Not on file  . Number of children: Not on file  . Years of education: Not on  file  . Highest education level: Not on file  Occupational History  . Not on file  Tobacco Use  . Smoking status: Former Games developer  . Smokeless tobacco: Never Used  Substance and Sexual Activity  . Alcohol use: Yes    Alcohol/week: 4.0 standard drinks    Types: 2 Cans of beer, 2 Glasses of wine per week    Comment: occ  . Drug use: No  . Sexual activity: Yes  Other Topics Concern  . 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   Social Determinants of Health   Financial Resource Strain:   . Difficulty of Paying Living Expenses:   Food Insecurity:   . Worried About Programme researcher, broadcasting/film/video in the Last Year:   . Barista in the Last Year:   Transportation Needs:   . Freight forwarder (Medical):   Marland Kitchen Lack of Transportation (Non-Medical):   Physical Activity:   . Days of Exercise per Week:   . Minutes of Exercise per Session:   Stress:   . Feeling of Stress :   Social Connections:   . Frequency of Communication with Friends and Family:   . Frequency of Social Gatherings with Friends and Family:   . Attends Religious Services:   . Active Member of Clubs or Organizations:   . Attends Banker Meetings:   Marland Kitchen Marital Status:   Intimate Partner Violence:   . Fear of Current or Ex-Partner:   . Emotionally Abused:   Marland Kitchen Physically Abused:   . Sexually Abused:     Family History  Problem Relation Age of Onset  . Diabetes Mother     ROS- All systems are reviewed and negative except as per the HPI above  Physical Exam: Vitals:   04/29/19 1456  BP: 110/74  Pulse: (!) 110  Weight: (!) 140.8 kg  Height: 5\' 9"  (1.753 m)    GEN- The patient is well appearing obese male, alert and  oriented x 3 today.   HEENT-head normocephalic, atraumatic, sclera clear, conjunctiva pink, hearing intact, trachea midline. Lungs- Clear to ausculation bilaterally, normal work of breathing Heart- irregular rate  and rhythm, no murmurs, rubs or gallops  GI- soft, NT, ND, + BS Extremities- no clubbing, cyanosis, or edema MS- no significant deformity or atrophy Skin- no rash or lesion Psych- euthymic mood, full affect Neuro- strength and sensation are intact   EKG- afib HR 110, LPFB, QRS 86, QTc 387  Echo 11/13/17 demonstrated  Left ventricle: The cavity size was mildly dilated. Systolic  function was normal. The estimated ejection fraction was in the  range of 60% to 65%. Wall motion was normal; there were no  regional wall motion abnormalities. Left ventricular diastolic  function parameters were normal.  - Aortic valve: Transvalvular velocity was within the normal range.  There was no stenosis. There was no regurgitation.  - Mitral valve: Transvalvular velocity was within the normal range.  There was no evidence for stenosis. There was trivial  regurgitation. Valve area by pressure half-time: 1.95 cm^2. Valve  area by continuity equation (using LVOT flow): 2.02 cm^2.  - Left atrium: The atrium was severely dilated.  - Right ventricle: The cavity size was normal. Wall thickness was  normal. Systolic function was normal.  - Atrial septum: No defect or patent foramen ovale was identified  by color flow Doppler.  - Tricuspid valve: There was no regurgitation.   Epic records reviewed  Assessment and Plan: 1. Persistent atrial fibrillation S/p DCCV on 03/25/19. He is back in afib today. We discussed therapeutic options today including changing AAD. Will plan to stop Multaq and start flecainide 50 mg BID after 3 day washout.  Continue increased dose of Toprol 50 mg BID for now. (He did not take extra dose prior to his appt) Continue Eliquis 5 mg BID, he denies any missed doses.   This patients CHA2DS2-VASc Score and unadjusted Ischemic Stroke Rate (% per year) is equal to 2.2 % stroke rate/year from a score of 2  Above score calculated as 1 point each if present [CHF, HTN,  DM, Vascular=MI/PAD/Aortic Plaque, Age if 65-74, or Male] Above score calculated as 2 points each if present [Age > 75, or Stroke/TIA/TE]  2. HTN Stable, no changes today.  3. OSA The importance of adequate treatment of sleep apnea was discussed today in order to improve our ability to maintain sinus rhythm long term. Patient reports that his last sleep study ~4 years ago showed mild OSA which did not qualify for CPAP. He weighed >400 lbs at the time.   4. Obesity Body mass index is 45.84 kg/m. Lifestyle modification was discussed and encouraged including regular physical activity and weight reduction.   Follow up in the AF clinic next week.    [facility] [address] [place], [place] [id number] [phone]

## 2019-04-29 NOTE — Patient Instructions (Signed)
STOP Multaq  On Friday Morning (4/9) you will START flecainide 50mg  twice a day  Continue increased dose of metoprolol

## 2019-05-07 ENCOUNTER — Other Ambulatory Visit: Payer: Self-pay

## 2019-05-07 ENCOUNTER — Ambulatory Visit (HOSPITAL_COMMUNITY)
Admission: RE | Admit: 2019-05-07 | Discharge: 2019-05-07 | Disposition: A | Discharge status: Home / Self Care | Payer: 59 | Source: Ambulatory Visit | Attending: Physician Assistant | Admitting: Physician Assistant

## 2019-05-07 ENCOUNTER — Encounter (HOSPITAL_COMMUNITY): Payer: Self-pay | Admitting: Physician Assistant

## 2019-05-07 VITALS — BP 116/68 | HR 70 | Ht 69.0 in | Wt 308.8 lb

## 2019-05-07 DIAGNOSIS — Z79899 Other long term (current) drug therapy: Secondary | ICD-10-CM | POA: Insufficient documentation

## 2019-05-07 DIAGNOSIS — Z87891 Personal history of nicotine dependence: Secondary | ICD-10-CM | POA: Diagnosis not present

## 2019-05-07 DIAGNOSIS — Z6841 Body Mass Index (BMI) 40.0 and over, adult: Secondary | ICD-10-CM | POA: Diagnosis not present

## 2019-05-07 DIAGNOSIS — E785 Hyperlipidemia, unspecified: Secondary | ICD-10-CM | POA: Insufficient documentation

## 2019-05-07 DIAGNOSIS — E119 Type 2 diabetes mellitus without complications: Secondary | ICD-10-CM | POA: Diagnosis not present

## 2019-05-07 DIAGNOSIS — I110 Hypertensive heart disease with heart failure: Secondary | ICD-10-CM | POA: Insufficient documentation

## 2019-05-07 DIAGNOSIS — G4733 Obstructive sleep apnea (adult) (pediatric): Secondary | ICD-10-CM | POA: Diagnosis not present

## 2019-05-07 DIAGNOSIS — E669 Obesity, unspecified: Secondary | ICD-10-CM | POA: Diagnosis not present

## 2019-05-07 DIAGNOSIS — I480 Paroxysmal atrial fibrillation: Secondary | ICD-10-CM

## 2019-05-07 DIAGNOSIS — I509 Heart failure, unspecified: Secondary | ICD-10-CM | POA: Insufficient documentation

## 2019-05-07 DIAGNOSIS — D6869 Other thrombophilia: Secondary | ICD-10-CM

## 2019-05-07 DIAGNOSIS — Z7901 Long term (current) use of anticoagulants: Secondary | ICD-10-CM | POA: Diagnosis not present

## 2019-05-07 DIAGNOSIS — Z7984 Long term (current) use of oral hypoglycemic drugs: Secondary | ICD-10-CM | POA: Diagnosis not present

## 2019-05-07 DIAGNOSIS — Z833 Family history of diabetes mellitus: Secondary | ICD-10-CM | POA: Diagnosis not present

## 2019-05-07 DIAGNOSIS — I4819 Other persistent atrial fibrillation: Secondary | ICD-10-CM | POA: Diagnosis present

## 2019-05-07 NOTE — Progress Notes (Signed)
Patient ID: Juan Schwartz, male   DOB: 08/03/1965, 54 y.o.   MRN: [id number]     Primary Care Physician: Patient, No Pcp Per Referring Physician: [facility] F/U Cardiologist: Dr. Katrinka Blazing Primary EP: Dr Eliezer Lofts Juan Schwartz is a 54 y.o. AA male with a h/o obesity, HTN, untreated sleep apnea, daily ETOH ("2 beers a day"), HLD, and NIDDM. He was seen by cardiology in 2004 when he presented with chest pain and SOB. An echo initially showed an EF of 35%. He was cathed by Dr Clarene Duke and had normal coronaries and an EF of 50%. It was felt he had an episode of CHF then but he has not had any further episodes.  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 He began to get weak and decided to stop for the day. The morning of admit 08/04/15 he went to work (works outside) and as soon as it started to warm up he again felt weak. He denied any chest pain or dyspnea. He denied any palpations. In the ED his EKG shows AF with RVR @ 126, poor ant RW. His B/P is a little low and his BUN was 24 suggesting he is mildly dehydrated. He was admitted and hydrated, IV heparin and dilt. It was noted he had sleep apnea and will need out pt sleep study.   His cardiac enzymes were negative. His TSH was normal. K+and Mg+ normal. He was eventually place on IV amiodarone and underwent TEE and DCCV on 08/05/15. EF was 45% with diffuse hypokinesis Tr MR. DCCV was successful and pt has maintained SR since.   He was seen by Dr. Katrinka Blazing and amiodarone was stopped. Metoprolol was added. His glucose off Metformin was elevated during hospitaltiztion.   Eliquis  added for 4 weeks and no asprin for that time. Plan for out pt sleep study.  F/u in afib clinic, 08/12/16. Pt states that he feels well. No return of afib. He returns to work next Tuesday and he does work outside in the heat. BP soft today at 98/64. He states that BP has been lower since metoprolol was added. He is not symptomatic with this currently. He  does have a sleep study pending. He is being compliant with eliquis and with a a chadsvasc score of at least 2(HYN, DM), it is felt that he should stay on anticoagulant.He has lost 150 lbs over the last couple of years, at one point weighting 468 lbs. He has plateaued with his weight  and plans to return to the gym and restart his walking 1-2 miles 2x a day. We discussed alcohol and afib connection and he has quit alcohol since hospitalization and plans  not drink alcohol going forward.   F/u in afib clinic 05/05/16.He recently was seen by [name], [name], for f/u and reported that he had not felt well recentlyy with more dizziness, fatigue.He was found to be in afib. It was recommended that he undergo a cpap titration trial due to return of afib but sleep study showed only mild sleep apnea last year. Pt states today that he does not want to pursue this. He states that it is all his fault that he went into afib as he had been non compliant with his meds. He was only taking BB occassionally and would often skip his anticoagulant.  He has increased his alcohol intake.He said before he did well with he followed the plan without afib.  F/u ina fib clinic for recent  ER visit 01/31/2018. He had been drinking a lot of beer over the holidays and had return of afib. He was rate controlled at time of ER visit and d/c home. Now in afib clinic he is back in SR. He converted about 2 hours after returning home from the ER. The ER MD suggested to go up on daily BB but this made him feel lightheaded. He has not had anymore alcohol and is writing this off. He continues on Multaq. He feels the holiday alcohol use was his trigger for recent afib episode.  Follow up in the AF clinic 03/20/19. Patient reports that on 03/16/19 he felt lightheaded and check his pulse oximeter which showed a rate in the 130s. He doubled his BB dose which resolved his symptoms. He remains in rate controlled afib today. Patient admits that the night  before, he had a glass of wine and 3-4 beers. Alcohol has been a trigger for his afib in the past. He is on Eliquis for a CHADS2VASC score of 2, he denies any missed doses of anticoagulation.   Follow up in the AF clinic 04/01/19. Patient is s/p DCCV on 03/25/19. He reports that he feels "100% better" since his procedure. He has not had any further heart racing or palpitations. He has not had any alcohol to drink recently.   Follow up in the AF clinic 04/29/19. Patient reports that on 04/26/19 he started having symptoms of lightheadedness and palpitations which has been typical of his afib symptoms in the past. He double his BB with good rate control. He denies specific triggers, he has had very little alcohol. He does admit he has gained about 30-40 lbs during the COVID pandemic.  Follow up in the AF clinic 05/07/19. Patient reports he felt he was back in SR not long after stopping Multaq. He then started flecainide after washout and has tolerated the medication well. He feels much better in SR with more energy. He has also starting walking with his wife for physical activity.   Today, he denies symptoms of palpitations, chest pain, shortness of breath, orthopnea, PND, lower extremity edema, presyncope, syncope, or neurologic sequela. The patient is tolerating medications without difficulties and is otherwise without complaint today.   Past Medical History:  Diagnosis Date  . Allergy   . Atrial fibrillation (HCC) 08/04/2015   CHA2DS2-VASc = 3  . CHF (congestive heart failure) (HCC) 2004   one episode  . Dehydration 08/04/2015  . Diabetes mellitus, type 2 (HCC) 11/29/2011  . Dyslipidemia 08/04/2015  . Essential hypertension 08/04/2015  . Hx of sleep apnea   . Hyperlipidemia   . Hypertension   . Normal coronary arteries 2004  . Obesity 11/29/2011  . Sleep apnea-untreated 12/16/2011   Pt denies having OSA when asked today   Past Surgical History:  Procedure Laterality Date  . CARDIAC CATHETERIZATION   2005  . CARDIOVERSION N/A 08/05/2015   Procedure: CARDIOVERSION;  Surgeon: Jake Bathe, MD;  Location: [facility] ENDOSCOPY;  Service: Cardiovascular;  Laterality: N/A;  . CARDIOVERSION N/A 07/19/2017   Procedure: CARDIOVERSION;  Surgeon: Vesta Mixer, MD;  Location: [facility] ENDOSCOPY;  Service: Cardiovascular;  Laterality: N/A;  . CARDIOVERSION N/A 03/25/2019   Procedure: CARDIOVERSION;  Surgeon: Jake Bathe, MD;  Location: [facility] ENDOSCOPY;  Service: Cardiovascular;  Laterality: N/A;  . HERNIA REPAIR    . TEE WITHOUT CARDIOVERSION N/A 08/05/2015   Procedure: TRANSESOPHAGEAL ECHOCARDIOGRAM (TEE);  Surgeon: Jake Bathe, MD;  Location: [facility] ENDOSCOPY;  Service: Cardiovascular;  Laterality: N/A;    Current Outpatient Medications  Medication Sig Dispense Refill  . apixaban (ELIQUIS) 5 MG TABS tablet Take 1 tablet (5 mg total) by mouth 2 (two) times daily. 180 tablet 3  . flecainide (TAMBOCOR) 50 MG tablet Take 1 tablet (50 mg total) by mouth 2 (two) times daily. 60 tablet 3  . glipiZIDE (GLUCOTROL) 10 MG tablet Take 10 mg by mouth 2 (two) times daily.    Marland Kitchen JARDIANCE 25 MG TABS tablet Take 25 mg by mouth daily.  6  . lisinopril (ZESTRIL) 2.5 MG tablet Take 1 tablet (2.5 mg total) by mouth daily. 90 tablet 3  . metFORMIN (GLUCOPHAGE) 1000 MG tablet Take 1 tablet (1,000 mg total) by mouth 2 (two) times daily with a meal. (Patient taking differently: Take 500-1,000 mg by mouth See admin instructions. Take 1000 mg daily, may take a second 500 mg dose during the day 3 or 4 times per week) 180 tablet 3  . metoprolol succinate (TOPROL-XL) 25 MG 24 hr tablet Take 1 tablet (25 mg total) by mouth 2 (two) times daily. May take an extra tablet as needed for breakthrough (Patient taking differently: Take 25 mg by mouth See admin instructions. Take 25 mg twice daily, when in afib take 50 mg twice daily) 200 tablet 3  . sildenafil (VIAGRA) 100 MG tablet Take 0.5-1 tablets (50-100 mg total) by mouth daily as needed for erectile  dysfunction. 5 tablet 11   No current facility-administered medications for this encounter.    No Known Allergies  Social History   Socioeconomic History  . Marital status: Married    Spouse name: Not on file  . Number of children: Not on file  . Years of education: Not on file  . Highest education level: Not on file  Occupational History  . Not on file  Tobacco Use  . Smoking status: Former Research scientist (life sciences)  . Smokeless tobacco: Never Used  Substance and Sexual Activity  . Alcohol use: Not Currently    Alcohol/week: 4.0 standard drinks    Types: 2 Glasses of wine, 2 Cans of beer per week    Comment: occ  . Drug use: No  . Sexual activity: Yes  Other Topics Concern  . 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   Social Determinants of Health   Financial Resource Strain:   . Difficulty of Paying Living Expenses:   Food Insecurity:   . Worried About Charity fundraiser in the Last Year:   . Arboriculturist in the Last Year:   Transportation Needs:   . Film/video editor (Medical):   Marland Kitchen Lack of Transportation (Non-Medical):   Physical Activity:   . Days of Exercise per Week:   . Minutes of Exercise per Session:   Stress:   . Feeling of Stress :   Social Connections:   . Frequency of Communication with Friends and Family:   . Frequency of Social Gatherings with Friends and Family:   . Attends Religious Services:   . Active Member of Clubs or Organizations:   . Attends Archivist Meetings:   Marland Kitchen Marital Status:   Intimate Partner Violence:   . Fear of Current or Ex-Partner:   . Emotionally Abused:   Marland Kitchen Physically Abused:   . Sexually Abused:     Family History  Problem Relation Age of Onset  . Diabetes Mother  ROS- All systems are reviewed and negative except as per the HPI above  Physical Exam: Vitals:   05/07/19 1530  BP: 116/68  Pulse: 70  Weight: (!) 140.1 kg  Height: 5'  9" (1.753 m)    GEN- The patient is well appearing obese male, alert and oriented x 3 today.   HEENT-head normocephalic, atraumatic, sclera clear, conjunctiva pink, hearing intact, trachea midline. Lungs- Clear to ausculation bilaterally, normal work of breathing Heart- Regular rate and rhythm, no murmurs, rubs or gallops  GI- soft, NT, ND, + BS Extremities- no clubbing, cyanosis, or edema MS- no significant deformity or atrophy Skin- no rash or lesion Psych- euthymic mood, full affect Neuro- strength and sensation are intact   EKG- SR HR 70, RAD, slow R wave prog, PR 194, QRS 94, QTc 427  Echo 11/13/17 demonstrated  Left ventricle: The cavity size was mildly dilated. Systolic  function was normal. The estimated ejection fraction was in the  range of 60% to 65%. Wall motion was normal; there were no  regional wall motion abnormalities. Left ventricular diastolic  function parameters were normal.  - Aortic valve: Transvalvular velocity was within the normal range.  There was no stenosis. There was no regurgitation.  - Mitral valve: Transvalvular velocity was within the normal range.  There was no evidence for stenosis. There was trivial  regurgitation. Valve area by pressure half-time: 1.95 cm^2. Valve  area by continuity equation (using LVOT flow): 2.02 cm^2.  - Left atrium: The atrium was severely dilated.  - Right ventricle: The cavity size was normal. Wall thickness was  normal. Systolic function was normal.  - Atrial septum: No defect or patent foramen ovale was identified  by color flow Doppler.  - Tricuspid valve: There was no regurgitation.   Epic records reviewed  Assessment and Plan: 1. Persistent atrial fibrillation S/p DCCV on 03/25/19 with ERAF. Patient converted to SR spontaneously prior to starting flecainide.  Continue flecainide 50 mg BID. Will arrange for TST. Continue Toprol 25 mg BID  Continue Eliquis 5 mg BID   This patients  CHA2DS2-VASc Score and unadjusted Ischemic Stroke Rate (% per year) is equal to 2.2 % stroke rate/year from a score of 2  Above score calculated as 1 point each if present [CHF, HTN, DM, Vascular=MI/PAD/Aortic Plaque, Age if 65-74, or Male] Above score calculated as 2 points each if present [Age > 75, or Stroke/TIA/TE]  2. HTN Stable, no changes today.  3. OSA The importance of adequate treatment of sleep apnea was discussed today in order to improve our ability to maintain sinus rhythm long term. Patient reports that his last sleep study ~4 years ago showed mild OSA which did not qualify for CPAP.   4. Obesity Body mass index is 45.6 kg/m. Lifestyle modification was discussed and encouraged including regular physical activity and weight reduction.   Follow up in the AF clinic as scheduled.    Jorja Loa PA-C [facility] [address] [place], [place] [address] [phone]

## 2019-05-10 ENCOUNTER — Telehealth (HOSPITAL_COMMUNITY): Payer: Self-pay | Admitting: *Deleted

## 2019-05-10 NOTE — Telephone Encounter (Signed)
Called patient to review necessity of having GXT with new flecainide start and need for covid testing for patient/employee testing. Patient states he has had enough covid tests and thinks its ridiculous to have another test done. Informed patient per Jorja Loa PA if patient unwilling to have covid testing/GXT performed will have to stop flecainide as patient exercises regularly and need to ensure no arrhythmias induced with exercise. Patient states he will "think about it" and let me know. Will await call back.

## 2019-05-10 NOTE — Telephone Encounter (Signed)
-----   Message from Pricilla Holm sent at 05/09/2019  9:34 AM EDT ----- Regarding: RE: GXT (ETT) scheduling Spoke with patient to schedule GXT - patient refused due to he have to have a Covid test done.  He stated he has had this test done to many times.  He want you to call him to discuss another test to do.  ----- Message ----- From: Shona Simpson, RN Sent: 05/07/2019   3:48 PM EDT To: [facility] Subject: GXT (ETT) scheduling                           Pt needs to be scheduled for GXT (ETT) for new flecainide start per clint fenton orders in epic.  Thanks! Kennyth Arnold

## 2019-06-15 ENCOUNTER — Other Ambulatory Visit: Payer: Self-pay | Admitting: Physician Assistant

## 2019-06-17 ENCOUNTER — Other Ambulatory Visit: Payer: Self-pay

## 2019-06-17 MED ORDER — APIXABAN 5 MG PO TABS: 5.0000 mg | ORAL_TABLET | Freq: Two times a day (BID) | ORAL | 3 refills | Status: DC

## 2019-06-17 MED ORDER — LISINOPRIL 2.5 MG PO TABS: 2.5000 mg | ORAL_TABLET | Freq: Every day | ORAL | 0 refills | Status: DC

## 2019-06-17 MED ORDER — METOPROLOL SUCCINATE ER 25 MG PO TB24: 25.0000 mg | ORAL_TABLET | Freq: Two times a day (BID) | ORAL | 1 refills | Status: DC

## 2019-06-17 NOTE — Telephone Encounter (Signed)
Pt calling requesting a refill on his eliquis and Metoprolol. Pt also goes to the A-Fib clinic and needs is Metoprolol refilled. Please address

## 2019-06-19 ENCOUNTER — Telehealth: Payer: Self-pay | Admitting: Physician Assistant

## 2019-06-19 NOTE — Telephone Encounter (Signed)
Spoke with Juan Schwartz to schedule the GXT that [facility] ordered.  Patient refused the test due to he will have to self-quarantine x 3 day.   That is money he will be missing from not working.   He just started back to work after being out for a while.   He can't afford to miss work.

## 2019-06-20 NOTE — Telephone Encounter (Signed)
Covid testing policy changed and GXT no longer requires COVID testing - pt agreeable to go forward with GXT testing now.

## 2019-06-20 NOTE — Telephone Encounter (Signed)
GXT scheduled

## 2019-06-25 ENCOUNTER — Other Ambulatory Visit: Payer: Self-pay

## 2019-06-25 ENCOUNTER — Ambulatory Visit (INDEPENDENT_AMBULATORY_CARE_PROVIDER_SITE_OTHER): Payer: 59

## 2019-06-25 DIAGNOSIS — I480 Paroxysmal atrial fibrillation: Secondary | ICD-10-CM | POA: Diagnosis not present

## 2019-06-25 LAB — EXERCISE TOLERANCE TEST
Estimated workload: 7 METS
Exercise duration (min): 4 min
Exercise duration (sec): 29 s
MPHR: 168 {beats}/min
Peak HR: 126 {beats}/min
Percent HR: 75 %
RPE: 17
Rest HR: 68 {beats}/min

## 2019-06-26 ENCOUNTER — Encounter (HOSPITAL_COMMUNITY): Payer: Self-pay

## 2019-08-01 ENCOUNTER — Encounter (HOSPITAL_COMMUNITY): Payer: Self-pay | Admitting: Physician Assistant

## 2019-08-01 ENCOUNTER — Other Ambulatory Visit: Payer: Self-pay

## 2019-08-01 ENCOUNTER — Ambulatory Visit (HOSPITAL_COMMUNITY)
Admission: RE | Admit: 2019-08-01 | Discharge: 2019-08-01 | Disposition: A | Discharge status: Home / Self Care | Payer: 59 | Source: Ambulatory Visit | Attending: Physician Assistant | Admitting: Physician Assistant

## 2019-08-01 ENCOUNTER — Telehealth: Payer: Self-pay | Admitting: Interventional Cardiology

## 2019-08-01 VITALS — BP 116/68 | HR 77 | Ht 69.0 in | Wt 300.6 lb

## 2019-08-01 DIAGNOSIS — D6869 Other thrombophilia: Secondary | ICD-10-CM

## 2019-08-01 DIAGNOSIS — Z79899 Other long term (current) drug therapy: Secondary | ICD-10-CM | POA: Diagnosis not present

## 2019-08-01 DIAGNOSIS — I10 Essential (primary) hypertension: Secondary | ICD-10-CM | POA: Insufficient documentation

## 2019-08-01 DIAGNOSIS — E669 Obesity, unspecified: Secondary | ICD-10-CM | POA: Diagnosis not present

## 2019-08-01 DIAGNOSIS — I4819 Other persistent atrial fibrillation: Secondary | ICD-10-CM | POA: Insufficient documentation

## 2019-08-01 DIAGNOSIS — Z6841 Body Mass Index (BMI) 40.0 and over, adult: Secondary | ICD-10-CM | POA: Diagnosis not present

## 2019-08-01 DIAGNOSIS — Z7984 Long term (current) use of oral hypoglycemic drugs: Secondary | ICD-10-CM | POA: Insufficient documentation

## 2019-08-01 DIAGNOSIS — E785 Hyperlipidemia, unspecified: Secondary | ICD-10-CM | POA: Insufficient documentation

## 2019-08-01 DIAGNOSIS — Z7901 Long term (current) use of anticoagulants: Secondary | ICD-10-CM | POA: Diagnosis not present

## 2019-08-01 DIAGNOSIS — E119 Type 2 diabetes mellitus without complications: Secondary | ICD-10-CM | POA: Diagnosis not present

## 2019-08-01 DIAGNOSIS — G4733 Obstructive sleep apnea (adult) (pediatric): Secondary | ICD-10-CM | POA: Diagnosis not present

## 2019-08-01 MED ORDER — LISINOPRIL 2.5 MG PO TABS: 2.5000 mg | ORAL_TABLET | Freq: Every day | ORAL | 0 refills | Status: DC

## 2019-08-01 NOTE — Telephone Encounter (Signed)
*  STAT* If patient is at the pharmacy, call can be transferred to refill team.   1. Which medications need to be refilled? (please list name of each medication and dose if known)? lisinopril (ZESTRIL) 2.5 MG tablet  2. Which pharmacy/location (including street and city if local pharmacy) is medication to be sent to?  [id number] - [place] ([place]), [place] - [address]  3. Do they need a 30 day or 90 day supply? 90 day    Patient has a scheduled appt with Dr. Katrinka Blazing on 10/02/19 at 9:40. Patient states he has 6 pills left.

## 2019-08-01 NOTE — Progress Notes (Signed)
Patient ID: Juan Schwartz, male   DOB: 08/03/1965, 54 y.o.   MRN: [id number]     Primary Care Physician: Patient, No Pcp Per Referring Physician: [facility] F/U Cardiologist: Dr. Katrinka Schwartz Primary EP: Dr Juan Schwartz Juan Schwartz is a 54 y.o. AA male with a h/o obesity, HTN, untreated sleep apnea, daily ETOH ("2 beers a day"), HLD, and NIDDM. He was seen by cardiology in 2004 when he presented with chest pain and SOB. An echo initially showed an EF of 35%. He was cathed by Dr Juan Schwartz and had normal coronaries and an EF of 50%. It was felt he had an episode of CHF then but he has not had any further episodes.  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 He began to get weak and decided to stop for the day. The morning of admit 08/04/15 he went to work (works outside) and as soon as it started to warm up he again felt weak. He denied any chest pain or dyspnea. He denied any palpations. In the ED his EKG shows AF with RVR @ 126, poor ant RW. His B/P is a little low and his BUN was 24 suggesting he is mildly dehydrated. He was admitted and hydrated, IV heparin and dilt. It was noted he had sleep apnea and will need out pt sleep study.   His cardiac enzymes were negative. His TSH was normal. K+and Mg+ normal. He was eventually place on IV amiodarone and underwent TEE and DCCV on 08/05/15. EF was 45% with diffuse hypokinesis Tr MR. DCCV was successful and pt has maintained SR since.   He was seen by Dr. Katrinka Schwartz and amiodarone was stopped. Metoprolol was added. His glucose off Metformin was elevated during hospitaltiztion.   Eliquis  added for 4 weeks and no asprin for that time. Plan for out pt sleep study.  F/u in afib clinic, 08/12/16. Pt states that he feels well. No return of afib. He returns to work next Tuesday and he does work outside in the heat. BP soft today at 98/64. He states that BP has been lower since metoprolol was added. He is not symptomatic with this currently. He  does have a sleep study pending. He is being compliant with eliquis and with a a chadsvasc score of at least 2(HYN, DM), it is felt that he should stay on anticoagulant.He has lost 150 lbs over the last couple of years, at one point weighting 468 lbs. He has plateaued with his weight  and plans to return to the gym and restart his walking 1-2 miles 2x a day. We discussed alcohol and afib connection and he has quit alcohol since hospitalization and plans  not drink alcohol going forward.   F/u in afib clinic 05/05/16.He recently was seen by Juan Schwartz, [name], for f/u and reported that he had not felt well recentlyy with more dizziness, fatigue.He was found to be in afib. It was recommended that he undergo a cpap titration trial due to return of afib but sleep study showed only mild sleep apnea last year. Pt states today that he does not want to pursue this. He states that it is all his fault that he went into afib as he had been non compliant with his meds. He was only taking BB occassionally and would often skip his anticoagulant.  He has increased his alcohol intake.He said before he did well with he followed the plan without afib.  F/u ina fib clinic for recent  ER visit 01/31/2018. He had been drinking a lot of beer over the holidays and had return of afib. He was rate controlled at time of ER visit and d/c home. Now in afib clinic he is back in SR. He converted about 2 hours after returning home from the ER. The ER MD suggested to go up on daily BB but this made him feel lightheaded. He has not had anymore alcohol and is writing this off. He continues on Multaq. He feels the holiday alcohol use was his trigger for recent afib episode.  Follow up in the AF clinic 03/20/19. Patient reports that on 03/16/19 he felt lightheaded and check his pulse oximeter which showed a rate in the 130s. He doubled his BB dose which resolved his symptoms. He remains in rate controlled afib today. Patient admits that the night  before, he had a glass of wine and 3-4 beers. Alcohol has been a trigger for his afib in the past. He is on Eliquis for a CHADS2VASC score of 2, he denies any missed doses of anticoagulation.   Follow up in the AF clinic 04/01/19. Patient is s/p DCCV on 03/25/19. He reports that he feels "100% better" since his procedure. He has not had any further heart racing or palpitations. He has not had any alcohol to drink recently.   Follow up in the AF clinic 04/29/19. Patient reports that on 04/26/19 he started having symptoms of lightheadedness and palpitations which has been typical of his afib symptoms in the past. He double his BB with good rate control. He denies specific triggers, he has had very little alcohol. He does admit he has gained about 30-40 lbs during the COVID pandemic.  Follow up in the AF clinic 05/07/19. Patient reports he felt he was back in SR not long after stopping Multaq. He then started flecainide after washout and has tolerated the medication well. He feels much better in SR with more energy. He has also starting walking with his wife for physical activity.  Follow up in the AF clinic 08/01/19. Patient reports he has done very well since his last visit. He has been aggressive with his lifestyle changes and has lost ~10 lbs. He denies heart racing or palpitations. He denies bleeding issues on anticoagulation.   Today, he denies symptoms of palpitations, chest pain, shortness of breath, orthopnea, PND, lower extremity edema, presyncope, syncope, or neurologic sequela. The patient is tolerating medications without difficulties and is otherwise without complaint today.   Past Medical History:  Diagnosis Date  . Allergy   . Atrial fibrillation (HCC) 08/04/2015   CHA2DS2-VASc = 3  . CHF (congestive heart failure) (HCC) 2004   one episode  . Dehydration 08/04/2015  . Diabetes mellitus, type 2 (HCC) 11/29/2011  . Dyslipidemia 08/04/2015  . Essential hypertension 08/04/2015  . Hx of sleep apnea    . Hyperlipidemia   . Hypertension   . Normal coronary arteries 2004  . Obesity 11/29/2011  . Sleep apnea-untreated 12/16/2011   Pt denies having OSA when asked today   Past Surgical History:  Procedure Laterality Date  . CARDIAC CATHETERIZATION  2005  . CARDIOVERSION N/A 08/05/2015   Procedure: CARDIOVERSION;  Surgeon: Jake Bathe, MD;  Location: [facility] ENDOSCOPY;  Service: Cardiovascular;  Laterality: N/A;  . CARDIOVERSION N/A 07/19/2017   Procedure: CARDIOVERSION;  Surgeon: Vesta Mixer, MD;  Location: [facility] ENDOSCOPY;  Service: Cardiovascular;  Laterality: N/A;  . CARDIOVERSION N/A 03/25/2019   Procedure: CARDIOVERSION;  Surgeon: Jake Bathe,  MD;  Location: [facility] ENDOSCOPY;  Service: Cardiovascular;  Laterality: N/A;  . HERNIA REPAIR    . TEE WITHOUT CARDIOVERSION N/A 08/05/2015   Procedure: TRANSESOPHAGEAL ECHOCARDIOGRAM (TEE);  Surgeon: Jake Bathe, MD;  Location: [facility] ENDOSCOPY;  Service: Cardiovascular;  Laterality: N/A;    Current Outpatient Medications  Medication Sig Dispense Refill  . acetaminophen (TYLENOL) 650 MG CR tablet Take 650 mg by mouth every 8 (eight) hours as needed for pain.    Marland Kitchen apixaban (ELIQUIS) 5 MG TABS tablet Take 1 tablet (5 mg total) by mouth 2 (two) times daily. 180 tablet 3  . flecainide (TAMBOCOR) 50 MG tablet Take 1 tablet (50 mg total) by mouth 2 (two) times daily. 60 tablet 3  . glipiZIDE (GLUCOTROL) 10 MG tablet Take 10 mg by mouth 2 (two) times daily.    Marland Kitchen JARDIANCE 25 MG TABS tablet Take 25 mg by mouth daily.  6  . lisinopril (ZESTRIL) 2.5 MG tablet Take 1 tablet (2.5 mg total) by mouth daily. Please make overdue appt with Dr. Katrinka Schwartz before anymore refills. 1st atetmpt 30 tablet 0  . metFORMIN (GLUCOPHAGE) 1000 MG tablet Take 1 tablet (1,000 mg total) by mouth 2 (two) times daily with a meal. 180 tablet 3  . metoprolol succinate (TOPROL-XL) 25 MG 24 hr tablet Take 1 tablet (25 mg total) by mouth 2 (two) times daily. May take an extra tablet as needed  for breakthrough 180 tablet 1   No current facility-administered medications for this encounter.    No Known Allergies  Social History   Socioeconomic History  . Marital status: Married    Spouse name: Not on file  . Number of children: Not on file  . Years of education: Not on file  . Highest education level: Not on file  Occupational History  . Not on file  Tobacco Use  . Smoking status: Former Games developer  . Smokeless tobacco: Never Used  Vaping Use  . Vaping Use: Never used  Substance and Sexual Activity  . Alcohol use: Yes    Alcohol/week: 4.0 standard drinks    Types: 2 Glasses of wine, 2 Cans of beer per week    Comment: occ  . Drug use: No  . Sexual activity: Yes  Other Topics Concern  . 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   Social Determinants of Health   Financial Resource Strain:   . Difficulty of Paying Living Expenses:   Food Insecurity:   . Worried About Programme researcher, broadcasting/film/video in the Last Year:   . Barista in the Last Year:   Transportation Needs:   . Freight forwarder (Medical):   Marland Kitchen Lack of Transportation (Non-Medical):   Physical Activity:   . Days of Exercise per Week:   . Minutes of Exercise per Session:   Stress:   . Feeling of Stress :   Social Connections:   . Frequency of Communication with Friends and Family:   . Frequency of Social Gatherings with Friends and Family:   . Attends Religious Services:   . Active Member of Clubs or Organizations:   . Attends Banker Meetings:   Marland Kitchen Marital Status:   Intimate Partner Violence:   . Fear of Current or Ex-Partner:   . Emotionally Abused:   Marland Kitchen Physically Abused:   . Sexually Abused:     Family History  Problem Relation Age  of Onset  . Diabetes Mother     ROS- All systems are reviewed and negative except as per the HPI above  Physical Exam: Vitals:   08/01/19 0855  BP: 116/68  Pulse: 77   Weight: (!) 136.4 kg  Height: 5\' 9"  (1.753 m)    GEN- The patient is well appearing obese male, alert and oriented x 3 today.   HEENT-head normocephalic, atraumatic, sclera clear, conjunctiva pink, hearing intact, trachea midline. Lungs- Clear to ausculation bilaterally, normal work of breathing Heart- Regular rate and rhythm, no murmurs, rubs or gallops  GI- soft, NT, ND, + BS Extremities- no clubbing, cyanosis, or edema MS- no significant deformity or atrophy Skin- no rash or lesion Psych- euthymic mood, full affect Neuro- strength and sensation are intact   EKG- SR HR 77, RAD, PR 208, QRS 88, QTc 430  Echo 11/13/17 demonstrated  Left ventricle: The cavity size was mildly dilated. Systolic  function was normal. The estimated ejection fraction was in the  range of 60% to 65%. Wall motion was normal; there were no  regional wall motion abnormalities. Left ventricular diastolic  function parameters were normal.  - Aortic valve: Transvalvular velocity was within the normal range.  There was no stenosis. There was no regurgitation.  - Mitral valve: Transvalvular velocity was within the normal range.  There was no evidence for stenosis. There was trivial  regurgitation. Valve area by pressure half-time: 1.95 cm^2. Valve  area by continuity equation (using LVOT flow): 2.02 cm^2.  - Left atrium: The atrium was severely dilated.  - Right ventricle: The cavity size was normal. Wall thickness was  normal. Systolic function was normal.  - Atrial septum: No defect or patent foramen ovale was identified  by color flow Doppler.  - Tricuspid valve: There was no regurgitation.   Epic records reviewed  Assessment and Plan: 1. Persistent atrial fibrillation S/p DCCV on 03/25/19 with ERAF. Patient appears to be maintaining SR. Continue flecainide 50 mg BID Continue Toprol 25 mg BID  Continue Eliquis 5 mg BID   This patients CHA2DS2-VASc Score and unadjusted Ischemic  Stroke Rate (% per year) is equal to 2.2 % stroke rate/year from a score of 2  Above score calculated as 1 point each if present [CHF, HTN, DM, Vascular=MI/PAD/Aortic Plaque, Age if 65-74, or Male] Above score calculated as 2 points each if present [Age > 75, or Stroke/TIA/TE]  2. HTN Stable, no changes today.  3. OSA Patient reports that his last sleep study in 2017 showed mild OSA which did not qualify for CPAP.   4. Obesity Body mass index is 44.39 kg/m. Lifestyle modification was discussed and encouraged including regular physical activity and weight reduction. Patient doing well with this.   Follow up with Dr Juan Schwartz as scheduled. AF clinic in 8 months.    Juan Loa PA-C Afib Clinic [facility] [address] [place], [place] [address] [phone]

## 2019-08-01 NOTE — Telephone Encounter (Signed)
Pt's medication was sent to pt's pharmacy as requested. Confirmation received.  °

## 2019-09-02 ENCOUNTER — Other Ambulatory Visit (HOSPITAL_COMMUNITY): Payer: Self-pay | Admitting: Physician Assistant

## 2019-09-02 NOTE — Telephone Encounter (Signed)
Patient called in regarding his Flecainide medication. He is out of refills. I sent in his Flecainide 90 day supply with 2 additional refills. Patient verbalized understanding.

## 2019-09-30 NOTE — Progress Notes (Deleted)
Cardiology Office Note:    Date:  09/30/2019   ID:  RAFAN Schwartz, DOB 11/08/1965, MRN [id number]  PCP:  Patient, No Pcp Per  Cardiologist:  Lesleigh Noe, MD   Referring MD: No ref. provider found   No chief complaint on file.   History of Present Illness:    Juan Schwartz is a 54 y.o. male with a hx of PAF requiring AAD therapy for control of rhythm (prior electrical CV x 2) Amio--> Multaq--> Flecainide, OSA, obesity, hyperlipidemia, DM II, essential hypertension, chronic diastolic HF.  ***  Past Medical History:  Diagnosis Date  . Allergy   . Atrial fibrillation (HCC) 08/04/2015   CHA2DS2-VASc = 3  . CHF (congestive heart failure) (HCC) 2004   one episode  . Dehydration 08/04/2015  . Diabetes mellitus, type 2 (HCC) 11/29/2011  . Dyslipidemia 08/04/2015  . Essential hypertension 08/04/2015  . Hx of sleep apnea   . Hyperlipidemia   . Hypertension   . Normal coronary arteries 2004  . Obesity 11/29/2011  . Sleep apnea-untreated 12/16/2011   Pt denies having OSA when asked today    Past Surgical History:  Procedure Laterality Date  . CARDIAC CATHETERIZATION  2005  . CARDIOVERSION N/A 08/05/2015   Procedure: CARDIOVERSION;  Surgeon: Jake Bathe, MD;  Location: [facility] ENDOSCOPY;  Service: Cardiovascular;  Laterality: N/A;  . CARDIOVERSION N/A 07/19/2017   Procedure: CARDIOVERSION;  Surgeon: Vesta Mixer, MD;  Location: [facility] ENDOSCOPY;  Service: Cardiovascular;  Laterality: N/A;  . CARDIOVERSION N/A 03/25/2019   Procedure: CARDIOVERSION;  Surgeon: Jake Bathe, MD;  Location: [facility] ENDOSCOPY;  Service: Cardiovascular;  Laterality: N/A;  . HERNIA REPAIR    . TEE WITHOUT CARDIOVERSION N/A 08/05/2015   Procedure: TRANSESOPHAGEAL ECHOCARDIOGRAM (TEE);  Surgeon: Jake Bathe, MD;  Location: [facility] ENDOSCOPY;  Service: Cardiovascular;  Laterality: N/A;    Current Medications: No outpatient medications have been marked as taking for the 10/02/19 encounter (Appointment) with  Lyn Records, MD.     Allergies:   Patient has no known allergies.   Social History   Socioeconomic History  . Marital status: Married    Spouse name: Not on file  . Number of children: Not on file  . Years of education: Not on file  . Highest education level: Not on file  Occupational History  . Not on file  Tobacco Use  . Smoking status: Former Games developer  . Smokeless tobacco: Never Used  Vaping Use  . Vaping Use: Never used  Substance and Sexual Activity  . Alcohol use: Yes    Alcohol/week: 4.0 standard drinks    Types: 2 Glasses of wine, 2 Cans of beer per week    Comment: occ  . Drug use: No  . Sexual activity: Yes  Other Topics Concern  . 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   Social Determinants of Health   Financial Resource Strain:   . Difficulty of Paying Living Expenses: Not on file  Food Insecurity:   . Worried About Programme researcher, broadcasting/film/video in the Last Year: Not on file  . Ran Out of Food in the Last Year: Not on file  Transportation Needs:   . Lack of Transportation (Medical): Not on file  . Lack of Transportation (Non-Medical): Not on file  Physical Activity:   . Days of Exercise per Week: Not on file  . Minutes of  Exercise per Session: Not on file  Stress:   . Feeling of Stress : Not on file  Social Connections:   . Frequency of Communication with Friends and Family: Not on file  . Frequency of Social Gatherings with Friends and Family: Not on file  . Attends Religious Services: Not on file  . Active Member of Clubs or Organizations: Not on file  . Attends Banker Meetings: Not on file  . Marital Status: Not on file     Family History: The patient's family history includes Diabetes in his mother.  ROS:   Please see the history of present illness.    *** All other systems reviewed and are negative.  EKGs/Labs/Other Studies Reviewed:    The following  studies were reviewed today: ***  EKG:  EKG ***  Recent Labs: 03/20/2019: BUN 14; Creatinine, Ser 0.79; Hemoglobin 15.5; Platelets 155; Potassium 4.6; Sodium 139  Recent Lipid Panel    Component Value Date/Time   CHOL 182 08/25/2012 0818   TRIG 193 (H) 08/25/2012 0818   HDL 48 08/25/2012 0818   CHOLHDL 3.8 08/25/2012 0818   VLDL 39 08/25/2012 0818   LDLCALC 95 08/25/2012 0818    Physical Exam:    VS:  There were no vitals taken for this visit.    Wt Readings from Last 3 Encounters:  08/01/19 (!) 300 lb 9.6 oz (136.4 kg)  05/07/19 (!) 308 lb 12.8 oz (140.1 kg)  04/29/19 (!) 310 lb 6.4 oz (140.8 kg)     GEN: ***. No acute distress HEENT: Normal NECK: No JVD. LYMPHATICS: No lymphadenopathy CARDIAC: *** RRR without murmur, gallop, or edema. VASCULAR: *** Normal Pulses. No bruits. RESPIRATORY:  Clear to auscultation without rales, wheezing or rhonchi  ABDOMEN: Soft, non-tender, non-distended, No pulsatile mass, MUSCULOSKELETAL: No deformity  SKIN: Warm and dry NEUROLOGIC:  Alert and oriented x 3 PSYCHIATRIC:  Normal affect   ASSESSMENT:    1. Paroxysmal atrial fibrillation (HCC)   2. Sleep apnea, unspecified type   3. Essential hypertension   4. Type 2 diabetes mellitus without complication, without long-term current use of insulin (HCC)   5. Chronic anticoagulation   6. Dyslipidemia   7. Educated about COVID-19 virus infection    PLAN:    In order of problems listed above:  1. ***   Medication Adjustments/Labs and Tests Ordered: Current medicines are reviewed at length with the patient today.  Concerns regarding medicines are outlined above.  No orders of the defined types were placed in this encounter.  No orders of the defined types were placed in this encounter.   There are no Patient Instructions on file for this visit.   Signed, Lesleigh Noe, MD  09/30/2019 4:29 PM    [facility]

## 2019-10-02 ENCOUNTER — Ambulatory Visit: Payer: 59 | Admitting: Interventional Cardiology

## 2019-10-16 NOTE — Progress Notes (Signed)
Cardiology Office Note:    Date:  10/17/2019   ID:  JERSON Schwartz, DOB [birth year], MRN [id number]  PCP:  Patient, No Pcp Per  Cardiologist:  Lesleigh Noe, MD   Referring MD: No ref. provider found   Chief Complaint  Patient presents with  . Atrial Fibrillation  . Congestive Heart Failure    History of Present Illness:    Juan Schwartz is a 54 y.o. male with a hx of  Obesity, hypertension, OSA, ETOH use, hyperlipidemia, DM II, paroxysmal atrial fibrillation, electrical cardioversion in July 2017 and 03/25/2019, anticoagulation started that time, mild reduction in LV function with mildly depressed LVEF in the 45%, chronic anticoagulation (CHADS VASC 3),  Jmari feels well.  He has had no recurrence of atrial fibrillation since the cardioversion performed in March.  Since that time he has been more mindful of therapy, compliant with recommendations, and has decreased alcohol use.  He denies orthopnea and PND.  He has had perhaps one episode where there was a rhythm change that improved after he increase his medications as instructed by the atrial for up clinic.   Past Medical History:  Diagnosis Date  . Allergy   . Atrial fibrillation (HCC) 08/04/2015   CHA2DS2-VASc = 3  . CHF (congestive heart failure) (HCC) 2004   one episode  . Dehydration 08/04/2015  . Diabetes mellitus, type 2 (HCC) 11/29/2011  . Dyslipidemia 08/04/2015  . Essential hypertension 08/04/2015  . Hx of sleep apnea   . Hyperlipidemia   . Hypertension   . Normal coronary arteries 2004  . Obesity 11/29/2011  . Sleep apnea-untreated 12/16/2011   Pt denies having OSA when asked today    Past Surgical History:  Procedure Laterality Date  . CARDIAC CATHETERIZATION  2005  . CARDIOVERSION N/A 08/05/2015   Procedure: CARDIOVERSION;  Surgeon: Jake Bathe, MD;  Location: [facility] ENDOSCOPY;  Service: Cardiovascular;  Laterality: N/A;  . CARDIOVERSION N/A 07/19/2017   Procedure: CARDIOVERSION;  Surgeon: Vesta Mixer, MD;  Location: [facility] ENDOSCOPY;  Service: Cardiovascular;  Laterality: N/A;  . CARDIOVERSION N/A 03/25/2019   Procedure: CARDIOVERSION;  Surgeon: Jake Bathe, MD;  Location: [facility] ENDOSCOPY;  Service: Cardiovascular;  Laterality: N/A;  . HERNIA REPAIR    . TEE WITHOUT CARDIOVERSION N/A 08/05/2015   Procedure: TRANSESOPHAGEAL ECHOCARDIOGRAM (TEE);  Surgeon: Jake Bathe, MD;  Location: [facility] ENDOSCOPY;  Service: Cardiovascular;  Laterality: N/A;    Current Medications: Current Meds  Medication Sig  . acetaminophen (TYLENOL) 650 MG CR tablet Take 650 mg by mouth every 8 (eight) hours as needed for pain.  Marland Kitchen apixaban (ELIQUIS) 5 MG TABS tablet Take 1 tablet (5 mg total) by mouth 2 (two) times daily.  . flecainide (TAMBOCOR) 50 MG tablet Take 1 tablet by mouth twice daily  . glipiZIDE (GLUCOTROL) 10 MG tablet Take 10 mg by mouth 2 (two) times daily.  Marland Kitchen lisinopril (ZESTRIL) 2.5 MG tablet Take 1 tablet (2.5 mg total) by mouth daily.  . metFORMIN (GLUCOPHAGE) 1000 MG tablet Take 1 tablet (1,000 mg total) by mouth 2 (two) times daily with a meal.  . metoprolol succinate (TOPROL-XL) 25 MG 24 hr tablet Take 1 tablet (25 mg total) by mouth 2 (two) times daily. May take an extra tablet as needed for breakthrough  . [DISCONTINUED] lisinopril (ZESTRIL) 2.5 MG tablet Take 1 tablet (2.5 mg total) by mouth daily. Please keep upcoming appt with Dr. Katrinka Blazing in September before anymore refills. Thank you  Allergies:   Patient has no known allergies.   Social History   Socioeconomic History  . Marital status: Married    Spouse name: Not on file  . Number of children: Not on file  . Years of education: Not on file  . Highest education level: Not on file  Occupational History  . Not on file  Tobacco Use  . Smoking status: Former Games developer  . Smokeless tobacco: Never Used  Vaping Use  . Vaping Use: Never used  Substance and Sexual Activity  . Alcohol use: Yes    Alcohol/week: 4.0 standard drinks     Types: 2 Glasses of wine, 2 Cans of beer per week    Comment: occ  . Drug use: No  . Sexual activity: Yes  Other Topics Concern  . 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   Social Determinants of Health   Financial Resource Strain:   . Difficulty of Paying Living Expenses: Not on file  Food Insecurity:   . Worried About Programme researcher, broadcasting/film/video in the Last Year: Not on file  . Ran Out of Food in the Last Year: Not on file  Transportation Needs:   . Lack of Transportation (Medical): Not on file  . Lack of Transportation (Non-Medical): Not on file  Physical Activity:   . Days of Exercise per Week: Not on file  . Minutes of Exercise per Session: Not on file  Stress:   . Feeling of Stress : Not on file  Social Connections:   . Frequency of Communication with Friends and Family: Not on file  . Frequency of Social Gatherings with Friends and Family: Not on file  . Attends Religious Services: Not on file  . Active Member of Clubs or Organizations: Not on file  . Attends Banker Meetings: Not on file  . Marital Status: Not on file     Family History: The patient's family history includes Diabetes in his mother.  ROS:   Please see the history of present illness.    He is concerned about the possibility of getting COVID-19.  He is not vaccinated.  Everyone else in his family has.  He has been has not for reasons that he cannot fully articulate and wanted my opinion which was strongly encouraging of receiving the vaccine especially in his phenotype where there is diabetes and obesity which could portend severe disease if he becomes infected.  All other systems reviewed and are negative.  EKGs/Labs/Other Studies Reviewed:    The following studies were reviewed today: No new data  EKG:  EKG not performed  Recent Labs: 03/20/2019: BUN 14; Creatinine, Ser 0.79; Hemoglobin 15.5; Platelets 155; Potassium  4.6; Sodium 139  Recent Lipid Panel    Component Value Date/Time   CHOL 182 08/25/2012 0818   TRIG 193 (H) 08/25/2012 0818   HDL 48 08/25/2012 0818   CHOLHDL 3.8 08/25/2012 0818   VLDL 39 08/25/2012 0818   LDLCALC 95 08/25/2012 0818    Physical Exam:    VS:  BP 102/62   Pulse 71   Ht 5\' 8"  (1.727 m)   Wt 287 lb 6.4 oz (130.4 kg)   SpO2 98%   BMI 43.70 kg/m     Wt Readings from Last 3 Encounters:  10/17/19 287 lb 6.4 oz (130.4 kg)  08/01/19 (!) 300 lb 9.6 oz (136.4 kg)  05/07/19 (!) 308 lb 12.8  oz (140.1 kg)     GEN: Obese. No acute distress HEENT: Normal NECK: No JVD. LYMPHATICS: No lymphadenopathy CARDIAC:  RRR without murmur, gallop, or edema. VASCULAR:  Normal Pulses. No bruits. RESPIRATORY:  Clear to auscultation without rales, wheezing or rhonchi  ABDOMEN: Soft, non-tender, non-distended, No pulsatile mass, MUSCULOSKELETAL: No deformity  SKIN: Warm and dry NEUROLOGIC:  Alert and oriented x 3 PSYCHIATRIC:  Normal affect   ASSESSMENT:    1. Persistent atrial fibrillation (HCC)   2. Secondary hypercoagulable state (HCC)   3. Sleep apnea, unspecified type   4. Essential hypertension   5. Type 2 diabetes mellitus without complication, without long-term current use of insulin (HCC)   6. Chronic anticoagulation   7. Morbid obesity (HCC)   8. Type 2 diabetes mellitus with hyperosmolarity without coma, without long-term current use of insulin (HCC)   9. Educated about COVID-19 virus infection    PLAN:    In order of problems listed above:  1. Based upon clinical exam today he is in rhythm.  Continue flecainide 50 mg twice daily. 2. Continue Eliquis 5 mg twice daily.  Chads vas score is 3. 3. Says he has been told sleep apnea is mild and he does not have to wear CPAP. 4. Blood pressure is low normal on Toprol-XL 25 mg twice daily and Zestril 2.5 mg daily. 5. Continue Glucophage 1000 mg twice daily Zestril 2.5 mg daily, and Glucotrol.  I contrasted Jardiance  against both Glucotrol and Glucophage helping him to understand that London Pepper has survival benefits and that perhaps one of the other medications could be discontinued instead of Jardiance which she has stopped taking.  He should discuss this with his primary physician. 6. Continue Eliquis 5 mg twice daily.  Hemoglobin and creatinine twice yearly. 7. Counseled to get mRNA vaccination as soon as possible.  He is strongly considering moving forward.  Clinical follow-up in 6 to 9 months.  Earlier if episodes of atrial fibrillation.  Continue to abstain from alcohol.  Consider resuming Jardiance and perhaps discontinuing Glucotrol.  Prolonged office visit related to counseling concerning COVID-19 vaccination and survival benefits associated with Jardiance therapy and his phenotype.   Medication Adjustments/Labs and Tests Ordered: Current medicines are reviewed at length with the patient today.  Concerns regarding medicines are outlined above.  Orders Placed This Encounter  Procedures  . Basic metabolic panel  . CBC   Meds ordered this encounter  Medications  . lisinopril (ZESTRIL) 2.5 MG tablet    Sig: Take 1 tablet (2.5 mg total) by mouth daily.    Dispense:  90 tablet    Refill:  3    Patient Instructions  Medication Instructions:  Your physician recommends that you continue on your current medications as directed. Please refer to the Current Medication list given to you today.  *If you need a refill on your cardiac medications before your next appointment, please call your pharmacy*   Lab Work: BMET and CBC today  If you have labs (blood work) drawn today and your tests are completely normal, you will receive your results only by: Marland Kitchen MyChart Message (if you have MyChart) OR . A paper copy in the mail If you have any lab test that is abnormal or we need to change your treatment, we will call you to review the results.   Testing/Procedures: None   Follow-Up: At [facility], you and your health needs are our priority.  As part of our continuing mission to provide you  with exceptional heart care, we have created designated Provider Care Teams.  These Care Teams include your primary Cardiologist (physician) and Advanced Practice Providers (APPs -  Physician Assistants and Nurse Practitioners) who all work together to provide you with the care you need, when you need it.  We recommend signing up for the patient portal called "MyChart".  Sign up information is provided on this After Visit Summary.  MyChart is used to connect with patients for Virtual Visits (Telemedicine).  Patients are able to view lab/test results, encounter notes, upcoming appointments, etc.  Non-urgent messages can be sent to your provider as well.   To learn more about what you can do with MyChart, go to [email or web address].    Your next appointment:   6-9 month(s)  The format for your next appointment:   In Person  Provider:   You may see Lesleigh Noe, MD or one of the following Advanced Practice Providers on your designated Care Team:    Norma Fredrickson, NP  Nada Boozer, NP  Georgie Chard, NP    Other Instructions      Signed, Lesleigh Noe, MD  10/17/2019 4:48 PM    [facility]

## 2019-10-17 ENCOUNTER — Encounter: Payer: Self-pay | Admitting: Interventional Cardiology

## 2019-10-17 ENCOUNTER — Other Ambulatory Visit: Payer: Self-pay

## 2019-10-17 ENCOUNTER — Ambulatory Visit: Payer: 59 | Admitting: Interventional Cardiology

## 2019-10-17 VITALS — BP 102/62 | HR 71 | Ht 68.0 in | Wt 287.4 lb

## 2019-10-17 DIAGNOSIS — E1100 Type 2 diabetes mellitus with hyperosmolarity without nonketotic hyperglycemic-hyperosmolar coma (NKHHC): Secondary | ICD-10-CM

## 2019-10-17 DIAGNOSIS — Z7189 Other specified counseling: Secondary | ICD-10-CM

## 2019-10-17 DIAGNOSIS — Z7901 Long term (current) use of anticoagulants: Secondary | ICD-10-CM

## 2019-10-17 DIAGNOSIS — I10 Essential (primary) hypertension: Secondary | ICD-10-CM

## 2019-10-17 DIAGNOSIS — I4819 Other persistent atrial fibrillation: Secondary | ICD-10-CM | POA: Diagnosis not present

## 2019-10-17 DIAGNOSIS — E119 Type 2 diabetes mellitus without complications: Secondary | ICD-10-CM

## 2019-10-17 DIAGNOSIS — G4730 Sleep apnea, unspecified: Secondary | ICD-10-CM | POA: Diagnosis not present

## 2019-10-17 DIAGNOSIS — D6869 Other thrombophilia: Secondary | ICD-10-CM

## 2019-10-17 MED ORDER — LISINOPRIL 2.5 MG PO TABS: 2.5000 mg | ORAL_TABLET | Freq: Every day | ORAL | 3 refills | Status: DC

## 2019-10-17 NOTE — Patient Instructions (Addendum)
Medication Instructions:  Your physician recommends that you continue on your current medications as directed. Please refer to the Current Medication list given to you today.  *If you need a refill on your cardiac medications before your next appointment, please call your pharmacy*   Lab Work: BMET and CBC today  If you have labs (blood work) drawn today and your tests are completely normal, you will receive your results only by: Marland Kitchen MyChart Message (if you have MyChart) OR . A paper copy in the mail If you have any lab test that is abnormal or we need to change your treatment, we will call you to review the results.   Testing/Procedures: None   Follow-Up: At [facility], you and your health needs are our priority.  As part of our continuing mission to provide you with exceptional heart care, we have created designated Provider Care Teams.  These Care Teams include your primary Cardiologist (physician) and Advanced Practice Providers (APPs -  Physician Assistants and Nurse Practitioners) who all work together to provide you with the care you need, when you need it.  We recommend signing up for the patient portal called "MyChart".  Sign up information is provided on this After Visit Summary.  MyChart is used to connect with patients for Virtual Visits (Telemedicine).  Patients are able to view lab/test results, encounter notes, upcoming appointments, etc.  Non-urgent messages can be sent to your provider as well.   To learn more about what you can do with MyChart, go to [email or web address].    Your next appointment:   6-9 month(s)  The format for your next appointment:   In Person  Provider:   You may see Lesleigh Noe, MD or one of the following Advanced Practice Providers on your designated Care Team:    Norma Fredrickson, NP  Nada Boozer, NP  Georgie Chard, NP    Other Instructions

## 2019-10-18 LAB — BASIC METABOLIC PANEL
BUN/Creatinine Ratio: 21 — ABNORMAL HIGH (ref 9–20)
BUN: 15 mg/dL (ref 6–24)
CO2: 25 mmol/L (ref 20–29)
Calcium: 9.8 mg/dL (ref 8.7–10.2)
Chloride: 100 mmol/L (ref 96–106)
Creatinine, Ser: 0.71 mg/dL — ABNORMAL LOW (ref 0.76–1.27)
GFR calc Af Amer: 125 mL/min/{1.73_m2} (ref >59)
GFR calc non Af Amer: 108 mL/min/{1.73_m2} (ref >59)
Glucose: 299 mg/dL — ABNORMAL HIGH (ref 65–99)
Potassium: 4.1 mmol/L (ref 3.5–5.2)
Sodium: 139 mmol/L (ref 134–144)

## 2019-10-18 LAB — CBC
Hematocrit: 41.3 % (ref 37.5–51.0)
Hemoglobin: 13.9 g/dL (ref 13.0–17.7)
MCH: 30 pg (ref 26.6–33.0)
MCHC: 33.7 g/dL (ref 31.5–35.7)
MCV: 89 fL (ref 79–97)
Platelets: 147 10*3/uL — ABNORMAL LOW (ref 150–450)
RBC: 4.64 x10E6/uL (ref 4.14–5.80)
RDW: 13.1 % (ref 11.6–15.4)
WBC: 7.7 10*3/uL (ref 3.4–10.8)

## 2020-01-14 IMAGING — DX DG CHEST 2V
2 series · 2 of 2 positions shown · non-contrast
Comparison: Chest x-rays dated 08/04/2015 and 11/29/2011.

CLINICAL DATA: Atrial fibrillation, dizziness today.

EXAM:
CHEST - 2 VIEW

[chest pa]
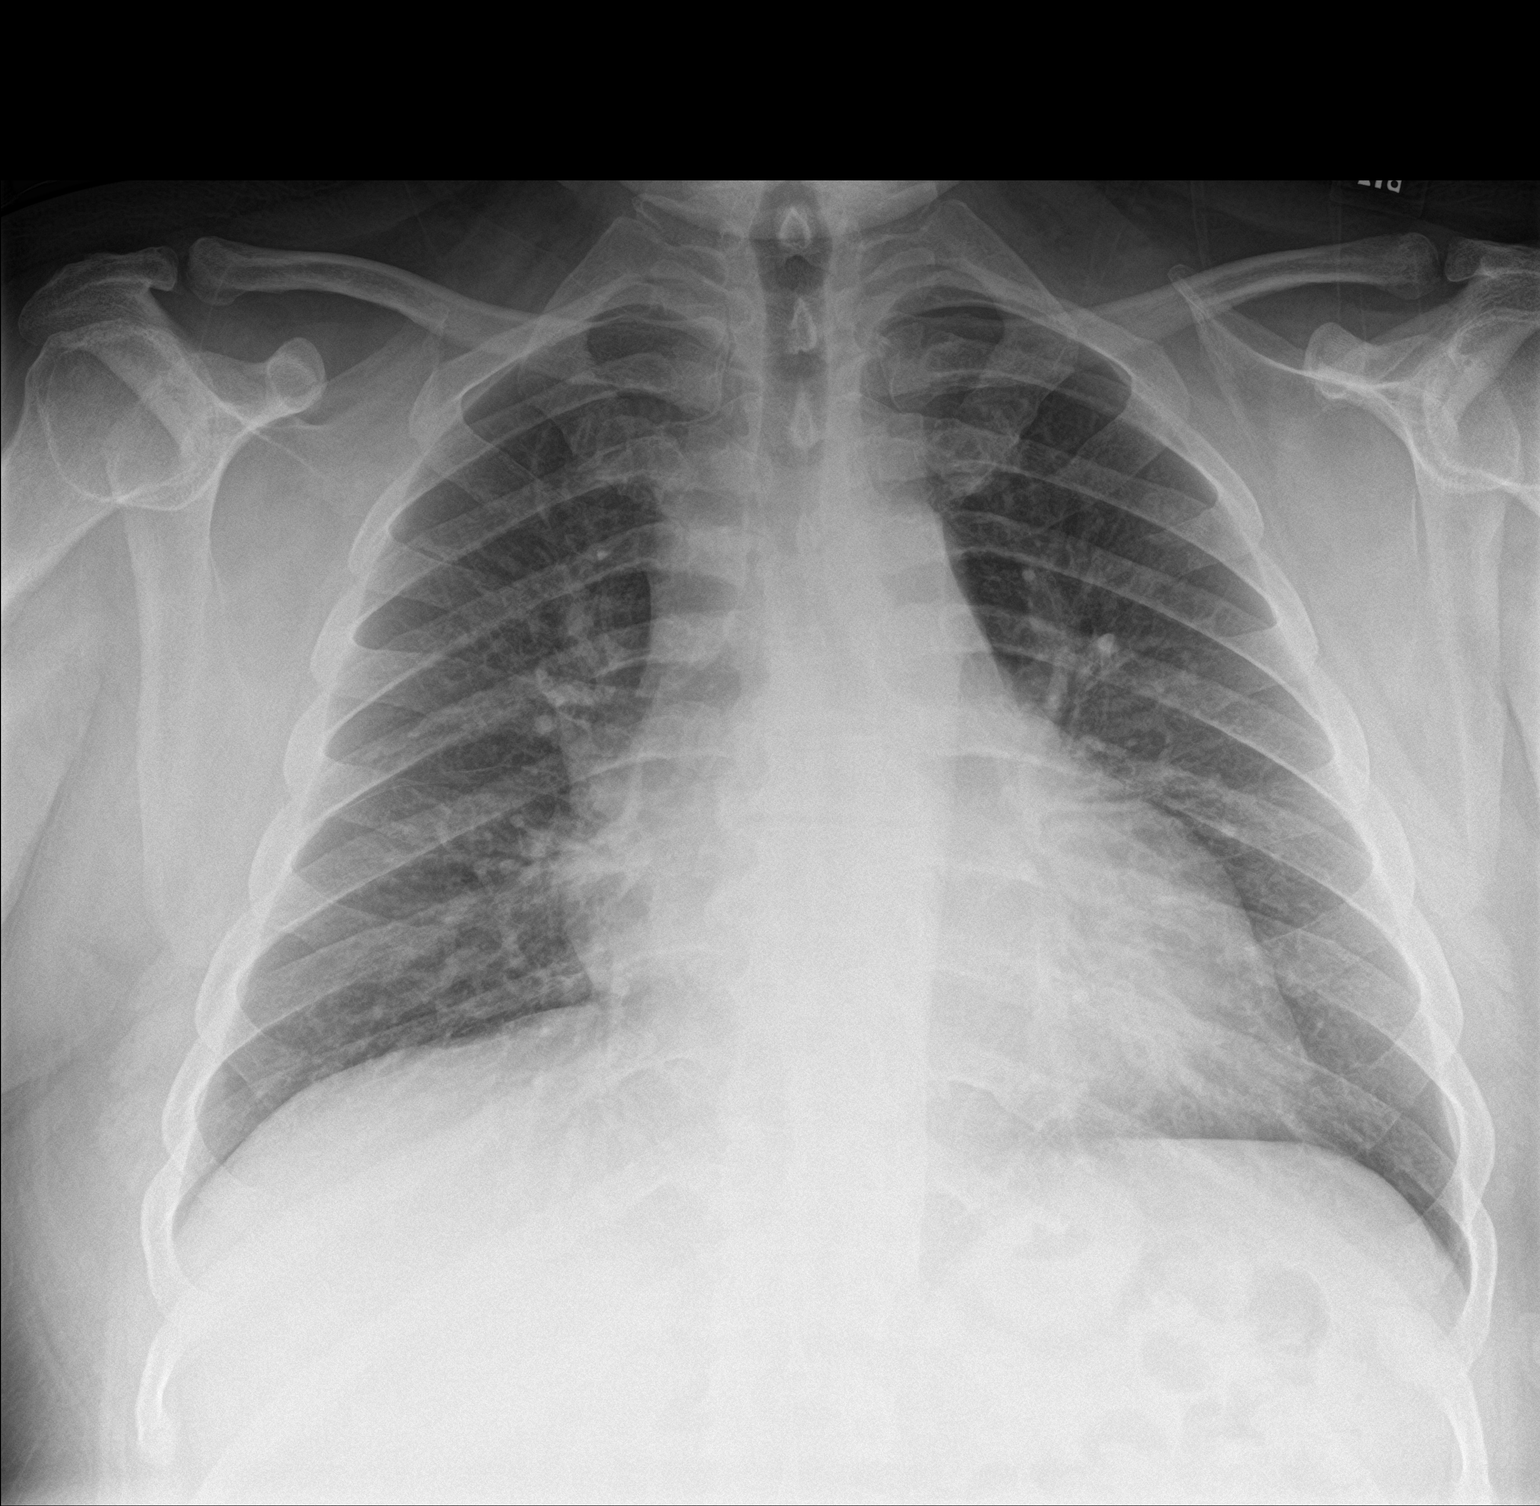

[chest lat]
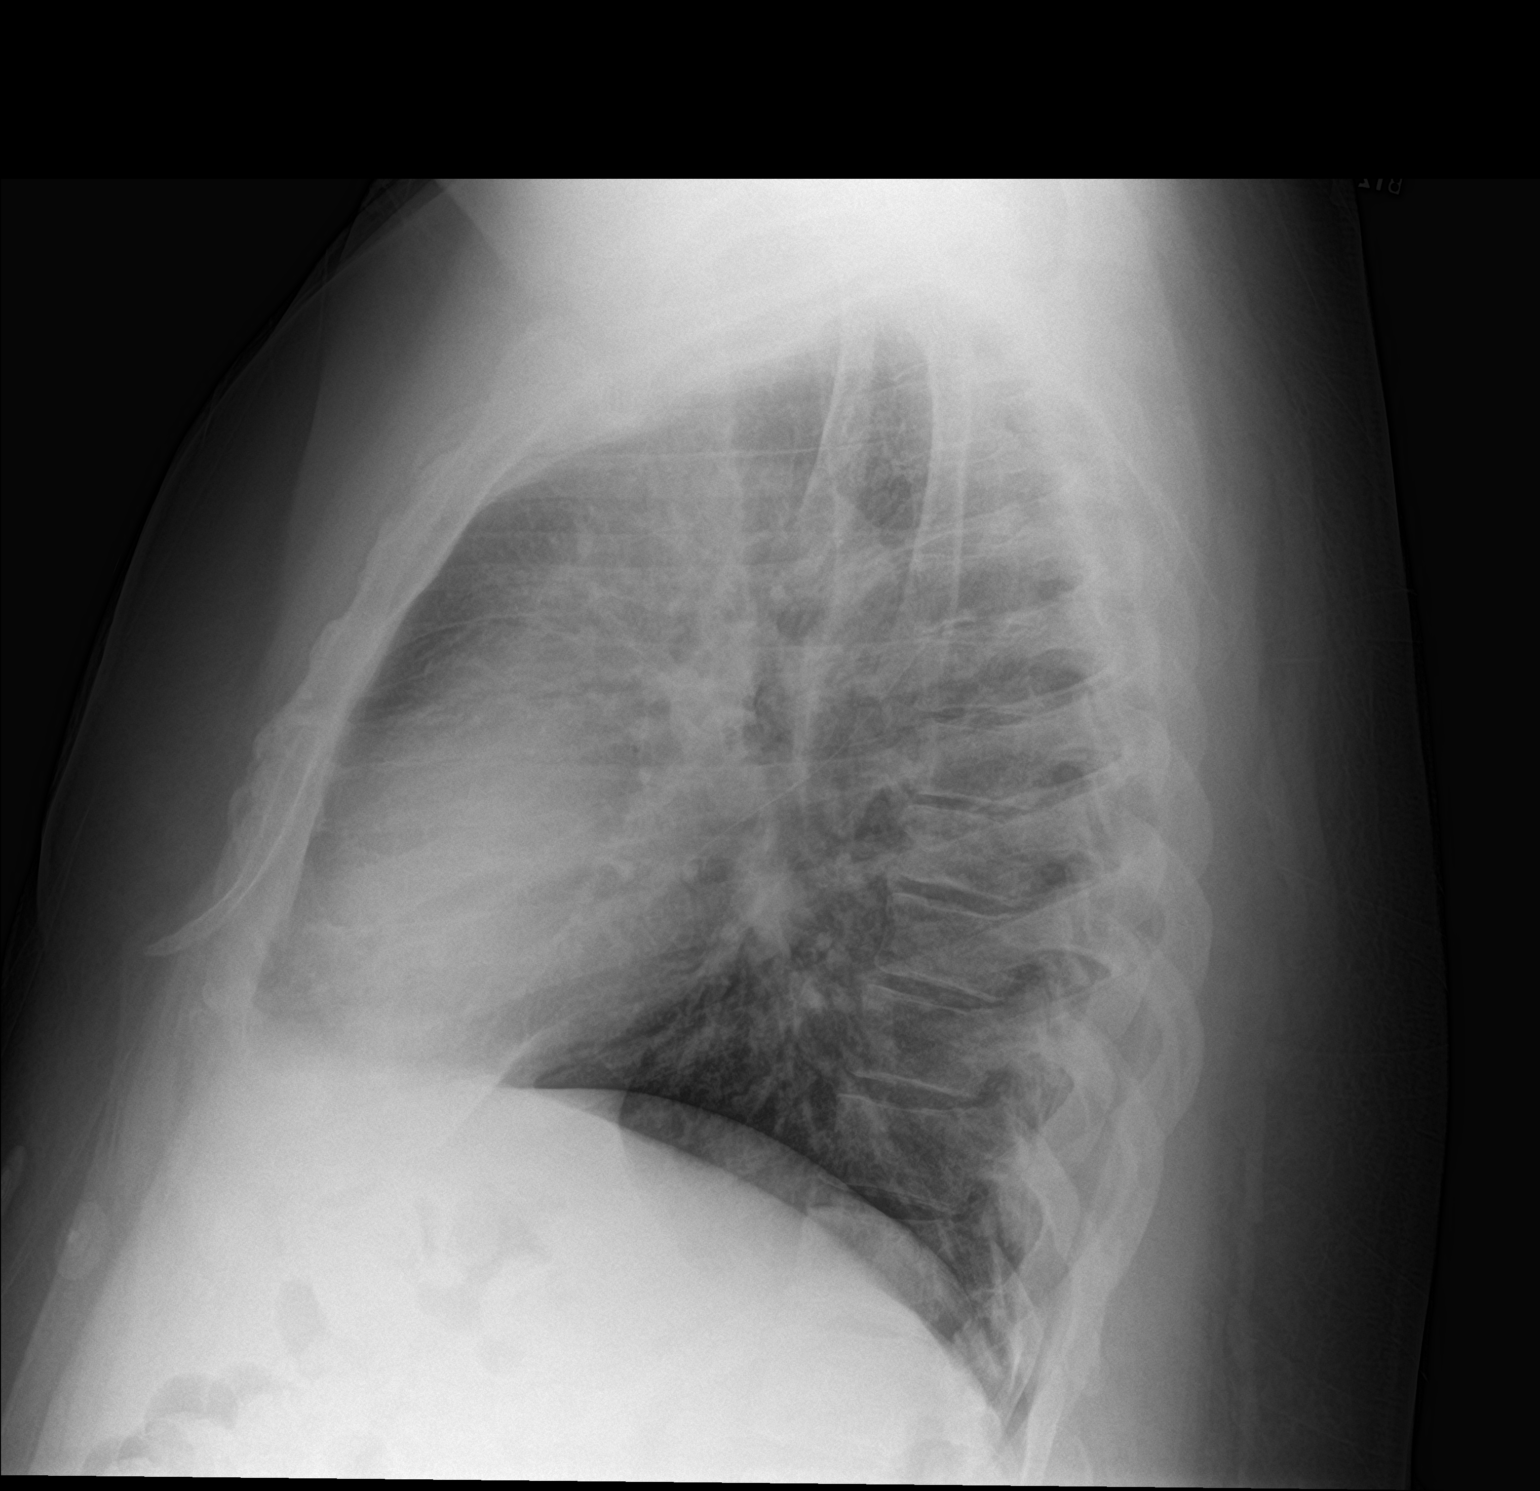

[2 of 2 positions shown; findings below may reference images not displayed]

FINDINGS: Cardiomegaly is grossly stable. Lungs are clear. No pleural effusion
or pneumothorax seen. Osseous structures about the chest are
unremarkable.
IMPRESSION: 1. No active cardiopulmonary disease. No evidence of pneumonia or
pulmonary edema.
2. Stable mild cardiomegaly.

## 2020-03-07 ENCOUNTER — Encounter (HOSPITAL_COMMUNITY): Payer: Self-pay | Admitting: Emergency Medicine

## 2020-03-07 ENCOUNTER — Emergency Department (HOSPITAL_COMMUNITY)
Admission: EM | Admit: 2020-03-07 | Discharge: 2020-03-08 | Disposition: A | Discharge status: Home / Self Care | Payer: 59 | Attending: Emergency Medicine | Admitting: Emergency Medicine

## 2020-03-07 ENCOUNTER — Emergency Department (HOSPITAL_COMMUNITY): Payer: 59

## 2020-03-07 ENCOUNTER — Other Ambulatory Visit: Payer: Self-pay

## 2020-03-07 DIAGNOSIS — Z7984 Long term (current) use of oral hypoglycemic drugs: Secondary | ICD-10-CM | POA: Insufficient documentation

## 2020-03-07 DIAGNOSIS — Z794 Long term (current) use of insulin: Secondary | ICD-10-CM | POA: Diagnosis not present

## 2020-03-07 DIAGNOSIS — Z79899 Other long term (current) drug therapy: Secondary | ICD-10-CM | POA: Insufficient documentation

## 2020-03-07 DIAGNOSIS — R739 Hyperglycemia, unspecified: Secondary | ICD-10-CM

## 2020-03-07 DIAGNOSIS — U071 COVID-19: Secondary | ICD-10-CM | POA: Diagnosis not present

## 2020-03-07 DIAGNOSIS — I509 Heart failure, unspecified: Secondary | ICD-10-CM | POA: Insufficient documentation

## 2020-03-07 DIAGNOSIS — I2510 Atherosclerotic heart disease of native coronary artery without angina pectoris: Secondary | ICD-10-CM | POA: Diagnosis not present

## 2020-03-07 DIAGNOSIS — Z7901 Long term (current) use of anticoagulants: Secondary | ICD-10-CM | POA: Diagnosis not present

## 2020-03-07 DIAGNOSIS — E119 Type 2 diabetes mellitus without complications: Secondary | ICD-10-CM

## 2020-03-07 DIAGNOSIS — E1165 Type 2 diabetes mellitus with hyperglycemia: Secondary | ICD-10-CM | POA: Diagnosis not present

## 2020-03-07 DIAGNOSIS — I110 Hypertensive heart disease with heart failure: Secondary | ICD-10-CM | POA: Diagnosis not present

## 2020-03-07 DIAGNOSIS — Z87891 Personal history of nicotine dependence: Secondary | ICD-10-CM | POA: Insufficient documentation

## 2020-03-07 DIAGNOSIS — R0602 Shortness of breath: Secondary | ICD-10-CM | POA: Diagnosis present

## 2020-03-07 NOTE — ED Triage Notes (Signed)
Patient had covid last week, still has low grade fever, shortness of breath increasing today.  Patient does have some chest pain with the shortness of breath.

## 2020-03-08 LAB — BASIC METABOLIC PANEL
Anion gap: 15 (ref 5–15)
BUN: 13 mg/dL (ref 6–20)
CO2: 23 mmol/L (ref 22–32)
Calcium: 8.7 mg/dL — ABNORMAL LOW (ref 8.9–10.3)
Chloride: 92 mmol/L — ABNORMAL LOW (ref 98–111)
Creatinine, Ser: 0.96 mg/dL (ref 0.61–1.24)
GFR, Estimated: >60 mL/min (ref >60)
Glucose, Bld: 396 mg/dL — ABNORMAL HIGH (ref 70–99)
Potassium: 5.2 mmol/L — ABNORMAL HIGH (ref 3.5–5.1)
Sodium: 130 mmol/L — ABNORMAL LOW (ref 135–145)

## 2020-03-08 LAB — CBC
HCT: 42 % (ref 39.0–52.0)
Hemoglobin: 13.8 g/dL (ref 13.0–17.0)
MCH: 30.1 pg (ref 26.0–34.0)
MCHC: 32.9 g/dL (ref 30.0–36.0)
MCV: 91.7 fL (ref 80.0–100.0)
Platelets: 108 10*3/uL — ABNORMAL LOW (ref 150–400)
RBC: 4.58 MIL/uL (ref 4.22–5.81)
RDW: 12 % (ref 11.5–15.5)
WBC: 5.1 10*3/uL (ref 4.0–10.5)
nRBC: 0 % (ref 0.0–0.2)

## 2020-03-08 LAB — CBG MONITORING, ED: Glucose-Capillary: 333 mg/dL — ABNORMAL HIGH (ref 70–99)

## 2020-03-08 LAB — TROPONIN I (HIGH SENSITIVITY)
Troponin I (High Sensitivity): 7 ng/L (ref <18)
Troponin I (High Sensitivity): 7 ng/L (ref <18)

## 2020-03-08 MED ORDER — ALBUTEROL SULFATE HFA 108 (90 BASE) MCG/ACT IN AERS
8.0000 | INHALATION_SPRAY | Freq: Once | RESPIRATORY_TRACT | Status: AC
  Administered 2020-03-08: 8 via RESPIRATORY_TRACT
  Filled 2020-03-08: qty 6.7

## 2020-03-08 MED ORDER — AEROCHAMBER PLUS FLO-VU LARGE MISC
1.0000 | Freq: Once | Status: AC
  Administered 2020-03-08: 1

## 2020-03-08 MED ORDER — METFORMIN HCL 1000 MG PO TABS: 1000.0000 mg | ORAL_TABLET | Freq: Two times a day (BID) | ORAL | 0 refills | Status: DC

## 2020-03-08 NOTE — Discharge Instructions (Addendum)
Thank you for allowing me to care for you today in the Emergency Department.   Your blood sugar is elevated today.  Please resume your home Metformin as prescribed.  Please follow-up with Clint early this week to have your blood sugar rechecked.  This week, he can reassess you to see if you need to be out of work longer for your symptoms.  Use 2 puffs of the albuterol inhaler every 4 hours for shortness of breath.  Return to the emergency department if you pass out, if your fingers or lips turn blue, if you develop respiratory distress, new numbness or weakness, worsening shortness of breath with chest pain, sweating, clamminess, or other new, concerning symptoms.

## 2020-03-08 NOTE — ED Provider Notes (Signed)
[facility] EMERGENCY DEPARTMENT Provider Note   CSN: [id number] Arrival date & time: 03/07/20  2250     History Chief Complaint  Patient presents with  . Shortness of Breath    Juan Schwartz is a 55 y.o. male with a history of atrial fibrillation on Eliquis, HTN, diabetes mellitus type 2, dyslipidemia, and obesity who presents the emergency department with a chief complaint of shortness of breath.  The patient tested positive for COVID-19 on 2/7 after developing symptoms 2 days prior. Reports that his son tested positive for COVID-19 2 weeks ago.  He reports that he had several days of chills, shortness of breath, myalgias, fatigue, but the symptoms seem to gradually improve until today when he developed worsening shortness of breath.  Shortness of breath is worse with exertion and improved with rest.  No other known aggravating or alleviating factors.  No treatment prior to arrival.  He denies chest pain, back pain, abdominal pain, vomiting, diarrhea, anorexia, numbness, weakness, leg swelling, or rash.  The patient reports that he was previously taking Metformin and glipizide for diabetes mellitus, but these medications were discontinued more than a year ago.  States that he still has more than 2 bottles of Metformin at home.  He reports that he has been having worsening polyuria, polydipsia, and polyphagia over the last 1 to 2 weeks.  No confusion, abdominal pain, blurry vision.  No missed doses of his home Eliquis.  He is unvaccinated against COVID-19.  The history is provided by the patient and medical records. No language interpreter was used.       Past Medical History:  Diagnosis Date  . Allergy   . Atrial fibrillation (HCC) 08/04/2015   CHA2DS2-VASc = 3  . CHF (congestive heart failure) (HCC) 2004   one episode  . Dehydration 08/04/2015  . Diabetes mellitus, type 2 (HCC) 11/29/2011  . Dyslipidemia 08/04/2015  . Essential hypertension 08/04/2015  .  Hx of sleep apnea   . Hyperlipidemia   . Hypertension   . Normal coronary arteries 2004  . Obesity 11/29/2011  . Sleep apnea-untreated 12/16/2011   Pt denies having OSA when asked today    Patient Active Problem List   Diagnosis Date Noted  . Secondary hypercoagulable state (HCC) 03/20/2019  . Paroxysmal atrial fibrillation (HCC) 08/04/2015  . Normal coronary arteries-2004 08/04/2015  . Essential hypertension 08/04/2015  . Dyslipidemia 08/04/2015  . Persistent atrial fibrillation (HCC) 08/04/2015  . Sleep apnea-untreated 12/16/2011  . Obesity 11/29/2011  . Diabetes mellitus, type 2 (HCC) 11/29/2011    Past Surgical History:  Procedure Laterality Date  . CARDIAC CATHETERIZATION  2005  . CARDIOVERSION N/A 08/05/2015   Procedure: CARDIOVERSION;  Surgeon: Jake Bathe, MD;  Location: [facility] ENDOSCOPY;  Service: Cardiovascular;  Laterality: N/A;  . CARDIOVERSION N/A 07/19/2017   Procedure: CARDIOVERSION;  Surgeon: Vesta Mixer, MD;  Location: [facility] ENDOSCOPY;  Service: Cardiovascular;  Laterality: N/A;  . CARDIOVERSION N/A 03/25/2019   Procedure: CARDIOVERSION;  Surgeon: Jake Bathe, MD;  Location: [facility] ENDOSCOPY;  Service: Cardiovascular;  Laterality: N/A;  . HERNIA REPAIR    . TEE WITHOUT CARDIOVERSION N/A 08/05/2015   Procedure: TRANSESOPHAGEAL ECHOCARDIOGRAM (TEE);  Surgeon: Jake Bathe, MD;  Location: [facility] ENDOSCOPY;  Service: Cardiovascular;  Laterality: N/A;       Family History  Problem Relation Age of Onset  . Diabetes Mother     Social History   Tobacco Use  . Smoking status: Former Games developer  . Smokeless  tobacco: Never Used  Vaping Use  . Vaping Use: Never used  Substance Use Topics  . Alcohol use: Yes    Alcohol/week: 4.0 standard drinks    Types: 2 Glasses of wine, 2 Cans of beer per week    Comment: occ  . Drug use: No    Home Medications Prior to Admission medications   Medication Sig Start Date End Date Taking? Authorizing Provider  acetaminophen  (TYLENOL) 650 MG CR tablet Take 650 mg by mouth every 8 (eight) hours as needed for pain.    [provider]  apixaban (ELIQUIS) 5 MG TABS tablet Take 1 tablet (5 mg total) by mouth 2 (two) times daily. 06/17/19   Fenton, Clint R, PA  flecainide (TAMBOCOR) 50 MG tablet Take 1 tablet by mouth twice daily 09/02/19   Fenton, Clint R, PA  glipiZIDE (GLUCOTROL) 10 MG tablet Take 10 mg by mouth 2 (two) times daily. 03/20/19   [provider]  lisinopril (ZESTRIL) 2.5 MG tablet Take 1 tablet (2.5 mg total) by mouth daily. 10/17/19   Lyn Records, MD  metFORMIN (GLUCOPHAGE) 1000 MG tablet Take 1 tablet (1,000 mg total) by mouth 2 (two) times daily with a meal. 03/08/20   Marvelle Caudill A, PA-C  metoprolol succinate (TOPROL-XL) 25 MG 24 hr tablet Take 1 tablet (25 mg total) by mouth 2 (two) times daily. May take an extra tablet as needed for breakthrough 06/17/19   Fenton, Levonne Spiller R, PA    Allergies    Patient has no known allergies.  Review of Systems   Review of Systems  Constitutional: Positive for chills and fatigue. Negative for appetite change and fever.  HENT: Negative for congestion and sore throat.   Eyes: Negative for visual disturbance.  Respiratory: Positive for shortness of breath. Negative for cough and wheezing.   Cardiovascular: Negative for chest pain and palpitations.  Gastrointestinal: Negative for abdominal pain, constipation, diarrhea, nausea and vomiting.  Endocrine: Positive for polydipsia, polyphagia and polyuria.  Genitourinary: Negative for dysuria and urgency.  Musculoskeletal: Positive for myalgias. Negative for back pain and joint swelling.  Skin: Negative for rash.  Allergic/Immunologic: Negative for immunocompromised state.  Neurological: Negative for seizures, syncope, weakness, numbness and headaches.  Psychiatric/Behavioral: Negative for confusion.    Physical Exam Updated Vital Signs BP 103/65   Pulse 80   Temp 99.9 F (37.7 C) (Oral)   Resp 18    SpO2 94%   Physical Exam Vitals and nursing note reviewed.  Constitutional:      General: He is not in acute distress.    Appearance: He is well-developed. He is obese. He is not ill-appearing, toxic-appearing or diaphoretic.  HENT:     Head: Normocephalic.     Nose: Nose normal. No congestion or rhinorrhea.     Mouth/Throat:     Mouth: Mucous membranes are moist.  Eyes:     Extraocular Movements: Extraocular movements intact.     Conjunctiva/sclera: Conjunctivae normal.     Pupils: Pupils are equal, round, and reactive to light.  Cardiovascular:     Rate and Rhythm: Normal rate and regular rhythm.     Heart sounds: No murmur heard.   Pulmonary:     Effort: Pulmonary effort is normal. No respiratory distress.     Breath sounds: No stridor. No wheezing, rhonchi or rales.     Comments: Lungs are clear to auscultation bilaterally.  No increased work of breathing. Chest:     Chest wall: No tenderness.  Abdominal:     General: There is no distension.     Palpations: Abdomen is soft. There is no mass.     Tenderness: There is no abdominal tenderness. There is no right CVA tenderness, left CVA tenderness, guarding or rebound.     Hernia: No hernia is present.  Musculoskeletal:     Cervical back: Neck supple.     Right lower leg: No edema.     Left lower leg: No edema.  Skin:    General: Skin is warm and dry.  Neurological:     General: No focal deficit present.     Mental Status: He is alert.  Psychiatric:        Behavior: Behavior normal.     ED Results / Procedures / Treatments   Labs (all labs ordered are listed, but only abnormal results are displayed) Labs Reviewed  BASIC METABOLIC PANEL - Abnormal; Notable for the following components:      Result Value   Sodium 130 (*)    Potassium 5.2 (*)    Chloride 92 (*)    Glucose, Bld 396 (*)    Calcium 8.7 (*)    All other components within normal limits  CBC - Abnormal; Notable for the following components:    Platelets 108 (*)    All other components within normal limits  CBG MONITORING, ED - Abnormal; Notable for the following components:   Glucose-Capillary 333 (*)    All other components within normal limits  TROPONIN I (HIGH SENSITIVITY)  TROPONIN I (HIGH SENSITIVITY)    EKG None  Radiology DG Chest 2 View  Result Date: 03/08/2020 CLINICAL DATA:  Shortness of breath. COVID positive last week. Persistent fever. Worsening shortness of breath today. EXAM: CHEST - 2 VIEW COMPARISON:  Radiograph 01/27/2018 FINDINGS: Patchy heterogeneous bilateral lung opacities. Stable upper normal heart size. Unchanged mediastinal contours. No pneumothorax or pleural effusion. No acute osseous abnormalities are seen. IMPRESSION: Patchy heterogeneous bilateral lung opacities consistent with COVID-19 pneumonia. Electronically Signed   By: Narda Rutherford M.D.   On: 03/08/2020 00:04    Procedures Procedures   Medications Ordered in ED Medications  albuterol (VENTOLIN HFA) 108 (90 Base) MCG/ACT inhaler 8 puff (8 puffs Inhalation Given 03/08/20 0624)  AeroChamber Plus Flo-Vu Large MISC 1 each (1 each Other Given 03/08/20 3220)    ED Course  I have reviewed the triage vital signs and the nursing notes.  Pertinent labs & imaging results that were available during my care of the patient were reviewed by me and considered in my medical decision making (see chart for details).    MDM Rules/Calculators/A&P                          55 year old male with a history of atrial fibrillation on Eliquis, HTN, diabetes mellitus type 2, dyslipidemia, and obesity who presents the emergency department with shortness of breath.  Patient tested positive for COVID-19 a week and a half ago and symptoms had seemed to be improving until today.  Of note, he also reports 1 to 2 weeks of polyuria, polydipsia, polyphagia.  Patient is very familiar with his home medications and no longer is taking any antihyperglycemics.  Vital signs  are stable.  Labs and imaging have been reviewed and independently interpreted by me.  COVID-19 test is positive, but patient initially tested positive at work on 2/7.   Chest x-ray with patchy bilateral lung opacities consistent with COVID-19 pneumonia.  No leukocytosis.  Troponin is not elevated.  Mild hyperkalemia, however, patient is having no chest pain.  He does not have any peaked T waves on EKG with normal sinus rhythm.  Patient has been given albuterol for shortness of breath, which would also help with mild hyperkalemia, the patient has no other risk factors for hyperkalemia.  Doubt ACS, PE, pericarditis, myocarditis, aortic dissection.  Glucose is 396.  Bicarb and anion gap are normal.  Doubt HHS or DKA.  Patient has not been taking Metformin or glipizide for more than a year.  Since he does also have some mild hyponatremia, offered to treat symptoms with IV fluid bolus, but patient adamantly declined.  He has been tolerating oral p.o. intake and would like to hydrate at home.  I did also offer NovoLog insulin since patient is hyperglycemic, but he preferred to resume his home Metformin.  Patient declined a dose in the ER and would like to use his home medication.  Repeat CBG is 333.  I think it is reasonable that the patient resumes his home Metformin at home.  I have advised that he follows up with primary care earlier this week for recheck and perhaps to have his A1c rechecked as well.  Today, I suspect his shortness of breath is coming from COVID-19.  However, patient was ambulated on room air and had no hypoxia.  He has no increased work of breathing.  He has been given an albuterol inhaler and supportive care discussed.  Patient is outside the window for remdesivir and other oral COVID-19 treatments.  The patient is hemodynamically stable no acute distress.  Safe for discharge home with close outpatient follow-up as indicated.  Final Clinical Impression(s) / ED Diagnoses Final diagnoses:   COVID-19  Hyperglycemia    Rx / DC Orders ED Discharge Orders         Ordered    metFORMIN (GLUCOPHAGE) 1000 MG tablet  2 times daily with meals        03/08/20 0614           Barkley Boards, PA-C 03/08/20 0849    Sabas Sous, MD 03/13/20 [phone]

## 2020-03-08 NOTE — ED Notes (Signed)
Pt ambulated in the room on a pulse ox. Pt maintained oxygen saturations of 96% on room air.

## 2020-03-09 ENCOUNTER — Telehealth: Payer: Self-pay | Admitting: Interventional Cardiology

## 2020-03-09 NOTE — Telephone Encounter (Signed)
Pt states that he was dx with covid on 2/7 but had symptom a few days prior to that but didn't go get checked out.  SOB occurring but not severe at this point.  Pt able to carry on a conversation with me with no issues.  Went to ER on 2/12 and CXR was consisted with covid PNA.  O2 in upper 80s/low 90s.  Advised pt to reach out to PCP.  Pt does not have PCP.  Advised pt to go to an Urgent Care for tx and sx.  Advised to establish with a PCP so they can do close f/u.  Pt verbalized understanding and was in agreement with this plan.

## 2020-03-09 NOTE — Telephone Encounter (Signed)
Pt c/o Shortness Of Breath: STAT if SOB developed within the last 24 hours or pt is noticeably SOB on the phone  1. Are you currently SOB (can you hear that pt is SOB on the phone)? "Just a hair"  2. How long have you been experiencing SOB? Started after testing positive for Covid on 03/02/20  3. Are you SOB when sitting or when up moving around? Both   4. Are you currently experiencing any other symptoms? Dry mouth    Juan Schwartz has been having issues with SOB since having Covid. States his oxygen level is normally 96 and since having Covid it has been ranging from 89-91. Please advise.

## 2020-03-12 ENCOUNTER — Emergency Department (HOSPITAL_COMMUNITY)
Admission: EM | Admit: 2020-03-12 | Discharge: 2020-03-12 | Disposition: A | Discharge status: Home / Self Care | Payer: 59 | Attending: Emergency Medicine | Admitting: Emergency Medicine

## 2020-03-12 ENCOUNTER — Encounter (HOSPITAL_COMMUNITY): Payer: Self-pay | Admitting: Emergency Medicine

## 2020-03-12 ENCOUNTER — Emergency Department (HOSPITAL_COMMUNITY): Payer: 59

## 2020-03-12 DIAGNOSIS — E119 Type 2 diabetes mellitus without complications: Secondary | ICD-10-CM | POA: Insufficient documentation

## 2020-03-12 DIAGNOSIS — R0602 Shortness of breath: Secondary | ICD-10-CM | POA: Diagnosis present

## 2020-03-12 DIAGNOSIS — Z7901 Long term (current) use of anticoagulants: Secondary | ICD-10-CM | POA: Insufficient documentation

## 2020-03-12 DIAGNOSIS — I509 Heart failure, unspecified: Secondary | ICD-10-CM | POA: Diagnosis not present

## 2020-03-12 DIAGNOSIS — Z79899 Other long term (current) drug therapy: Secondary | ICD-10-CM | POA: Insufficient documentation

## 2020-03-12 DIAGNOSIS — I4891 Unspecified atrial fibrillation: Secondary | ICD-10-CM | POA: Insufficient documentation

## 2020-03-12 DIAGNOSIS — Z7984 Long term (current) use of oral hypoglycemic drugs: Secondary | ICD-10-CM | POA: Insufficient documentation

## 2020-03-12 DIAGNOSIS — U071 COVID-19: Secondary | ICD-10-CM | POA: Insufficient documentation

## 2020-03-12 DIAGNOSIS — J189 Pneumonia, unspecified organism: Secondary | ICD-10-CM

## 2020-03-12 DIAGNOSIS — J1282 Pneumonia due to coronavirus disease 2019: Secondary | ICD-10-CM | POA: Insufficient documentation

## 2020-03-12 DIAGNOSIS — Z87891 Personal history of nicotine dependence: Secondary | ICD-10-CM | POA: Diagnosis not present

## 2020-03-12 DIAGNOSIS — I110 Hypertensive heart disease with heart failure: Secondary | ICD-10-CM | POA: Insufficient documentation

## 2020-03-12 MED ORDER — DOXYCYCLINE HYCLATE 100 MG PO TABS
100.0000 mg | ORAL_TABLET | Freq: Once | ORAL | Status: AC
  Administered 2020-03-12: 100 mg via ORAL
  Filled 2020-03-12: qty 1

## 2020-03-12 MED ORDER — DOXYCYCLINE HYCLATE 100 MG PO CAPS: 100.0000 mg | ORAL_CAPSULE | Freq: Two times a day (BID) | ORAL | 0 refills | Status: DC

## 2020-03-12 NOTE — ED Provider Notes (Signed)
[facility] EMERGENCY DEPARTMENT Provider Note  CSN: [id number] Arrival date & time: 03/12/20 1402    History Chief Complaint  Patient presents with  . Shortness of Breath    HPI  Juan Schwartz is a 55 y.o. male with history of afib on Eliquis, DM on Metformin who is about 12 days of Covid symptoms. Initially tested positive on 2/7. Has had continued cough and SOB. He was seen in the ED on 2/12 and had extensive workup including CXR and labs. His CXR was consistent with Covid and he was moderately hyperglycemia but ultimately discharged with supportive care. He was seen 3 days ago at [facility] and given an Rx for a cough medicine he was told 'would clear that Covid right up' which he assumed was an antibiotic but he wasn't sure. He was unable to get the Rx filled because all the local pharmacies were out of stock. Regardless, he did not find that out until this morning so he went back to [facility] where he had some labs done (which he reports looked good) and a CXR showing pneumonia again. He was told to come to the ED 'to be admitted for antibiotics'. He reports occasional hypoxia with walking at home but no fevers. Cough is dry. No chest pain. No more GI symptoms.    Past Medical History:  Diagnosis Date  . Allergy   . Atrial fibrillation (HCC) 08/04/2015   CHA2DS2-VASc = 3  . CHF (congestive heart failure) (HCC) 2004   one episode  . Dehydration 08/04/2015  . Diabetes mellitus, type 2 (HCC) 11/29/2011  . Dyslipidemia 08/04/2015  . Essential hypertension 08/04/2015  . Hx of sleep apnea   . Hyperlipidemia   . Hypertension   . Normal coronary arteries 2004  . Obesity 11/29/2011  . Sleep apnea-untreated 12/16/2011   Pt denies having OSA when asked today    Past Surgical History:  Procedure Laterality Date  . CARDIAC CATHETERIZATION  2005  . CARDIOVERSION N/A 08/05/2015   Procedure: CARDIOVERSION;  Surgeon: Jake Bathe, MD;  Location: [facility] ENDOSCOPY;  Service:  Cardiovascular;  Laterality: N/A;  . CARDIOVERSION N/A 07/19/2017   Procedure: CARDIOVERSION;  Surgeon: Vesta Mixer, MD;  Location: [facility] ENDOSCOPY;  Service: Cardiovascular;  Laterality: N/A;  . CARDIOVERSION N/A 03/25/2019   Procedure: CARDIOVERSION;  Surgeon: Jake Bathe, MD;  Location: [facility] ENDOSCOPY;  Service: Cardiovascular;  Laterality: N/A;  . HERNIA REPAIR    . TEE WITHOUT CARDIOVERSION N/A 08/05/2015   Procedure: TRANSESOPHAGEAL ECHOCARDIOGRAM (TEE);  Surgeon: Jake Bathe, MD;  Location: [facility] ENDOSCOPY;  Service: Cardiovascular;  Laterality: N/A;    Family History  Problem Relation Age of Onset  . Diabetes Mother     Social History   Tobacco Use  . Smoking status: Former Games developer  . Smokeless tobacco: Never Used  Vaping Use  . Vaping Use: Never used  Substance Use Topics  . Alcohol use: Yes    Alcohol/week: 4.0 standard drinks    Types: 2 Glasses of wine, 2 Cans of beer per week    Comment: occ  . Drug use: No     Home Medications Prior to Admission medications   Medication Sig Start Date End Date Taking? Authorizing Provider  doxycycline (VIBRAMYCIN) 100 MG capsule Take 1 capsule (100 mg total) by mouth 2 (two) times daily. 03/12/20  Yes Pollyann Savoy, MD  acetaminophen (TYLENOL) 650 MG CR tablet Take 650 mg by mouth every 8 (eight) hours as needed for  pain.    [provider]  apixaban (ELIQUIS) 5 MG TABS tablet Take 1 tablet (5 mg total) by mouth 2 (two) times daily. 06/17/19   Fenton, Clint R, PA  flecainide (TAMBOCOR) 50 MG tablet Take 1 tablet by mouth twice daily 09/02/19   Fenton, Clint R, PA  glipiZIDE (GLUCOTROL) 10 MG tablet Take 10 mg by mouth 2 (two) times daily. 03/20/19   [provider]  lisinopril (ZESTRIL) 2.5 MG tablet Take 1 tablet (2.5 mg total) by mouth daily. 10/17/19   Lyn Records, MD  metFORMIN (GLUCOPHAGE) 1000 MG tablet Take 1 tablet (1,000 mg total) by mouth 2 (two) times daily with a meal. 03/08/20   McDonald, Mia A, PA-C   metoprolol succinate (TOPROL-XL) 25 MG 24 hr tablet Take 1 tablet (25 mg total) by mouth 2 (two) times daily. May take an extra tablet as needed for breakthrough 06/17/19   Fenton, Levonne Spiller R, PA     Allergies    Patient has no known allergies.   Review of Systems   Review of Systems A comprehensive review of systems was completed and negative except as noted in HPI.    Physical Exam BP 135/88   Pulse 92   Temp 99 F (37.2 C) (Oral)   Resp (!) 23   SpO2 96%   Physical Exam Vitals and nursing note reviewed.  Constitutional:      Appearance: Normal appearance.  HENT:     Head: Normocephalic and atraumatic.     Nose: Nose normal.     Mouth/Throat:     Mouth: Mucous membranes are moist.  Eyes:     Extraocular Movements: Extraocular movements intact.     Conjunctiva/sclera: Conjunctivae normal.  Cardiovascular:     Rate and Rhythm: Normal rate.  Pulmonary:     Effort: Pulmonary effort is normal.     Breath sounds: Rhonchi present.  Abdominal:     General: Abdomen is flat.     Palpations: Abdomen is soft.     Tenderness: There is no abdominal tenderness.  Musculoskeletal:        General: No swelling. Normal range of motion.     Cervical back: Neck supple.  Skin:    General: Skin is warm and dry.  Neurological:     General: No focal deficit present.     Mental Status: He is alert.  Psychiatric:        Mood and Affect: Mood normal.      ED Results / Procedures / Treatments   Labs (all labs ordered are listed, but only abnormal results are displayed) Labs Reviewed - No data to display  EKG EKG Interpretation  Date/Time:  Thursday March 12 2020 14:21:18 EST Ventricular Rate:  99 PR Interval:  162 QRS Duration: 82 QT Interval:  374 QTC Calculation: 479 R Axis:   133 Text Interpretation: Sinus rhythm with Premature atrial complexes Right axis deviation Possible Anterior infarct , age undetermined Abnormal ECG No significant change since last tracing  Confirmed by Susy Frizzle [phone]) on 03/12/2020 4:52:15 PM   Radiology DG Chest Portable 1 View  Result Date: 03/12/2020 CLINICAL DATA:  COVID 19 3 weeks ago, shortness of breath, fever EXAM: PORTABLE CHEST 1 VIEW COMPARISON:  03/07/2020 FINDINGS: Single frontal view of the chest demonstrates progressive bilateral multifocal ground-glass airspace disease consistent with worsening COVID 19 pneumonia. No effusion or pneumothorax. The cardiac silhouette is stable. IMPRESSION: 1. Progressive multifocal bilateral pneumonia consistent with COVID 19. Electronically Signed  By: Sharlet Salina M.D.   On: 03/12/2020 17:07    Procedures Procedures  Medications Ordered in the ED Medications  doxycycline (VIBRA-TABS) tablet 100 mg (has no administration in time range)     MDM Rules/Calculators/A&P MDM Patient resting in no distress. Not hypoxic at rest or with ambulation in the room on RA. He is not febrile or tachycardic here. His CXR does show worsening infiltrates. Discussed with patient that he does not have a clear indication for admission, particularly since he has not had a trial of outpatient antibiotics for what could be a secondary bacterial pneumonia. I discussed with him that since I cannot see the results of his lab work from [facility] that it is possible he has a lab abnormality which would indicate admission but he has declined additional lab work here today. He would like to try a trial of oral antibiotics (will give Doxycycline) and advised if he is not improving in the next 48 hours or if he has significant worsening in the meantime, he should return to the ED for re-assessment.  ED Course  I have reviewed the triage vital signs and the nursing notes.  Pertinent labs & imaging results that were available during my care of the patient were reviewed by me and considered in my medical decision making (see chart for details).     Final Clinical Impression(s) / ED Diagnoses Final  diagnoses:  Community acquired pneumonia, unspecified laterality    Rx / DC Orders ED Discharge Orders         Ordered    doxycycline (VIBRAMYCIN) 100 MG capsule  2 times daily        03/12/20 1735           Pollyann Savoy, MD 03/12/20 1735

## 2020-03-12 NOTE — ED Triage Notes (Signed)
Patient tested positive for COVID approximately two weeks ago, complains of continued shortness of breath. Patient went to [facility] for evaluation, sent to ED for evaluation and treatment for pneumonia.

## 2020-03-16 ENCOUNTER — Other Ambulatory Visit: Payer: Self-pay | Admitting: Physician Assistant

## 2020-03-31 NOTE — Progress Notes (Signed)
Patient ID: Juan Schwartz, male   DOB: 08/16/1965, 55 y.o.   MRN: [id number]     Primary Care Physician: Patient, No Pcp Per Referring Physician: [facility] F/U Cardiologist: Dr. Katrinka Blazing Primary EP: Dr Eliezer Lofts Juan Schwartz is a 55 y.o. AA male with a h/o obesity, HTN, untreated sleep apnea, daily ETOH ("2 beers a day"), HLD, and NIDDM. He was seen by cardiology in 2004 when he presented with chest pain and SOB. An echo initially showed an EF of 35%. He was cathed by Dr Clarene Duke and had normal coronaries and an EF of 50%. It was felt he had an episode of CHF then but he has not had any further episodes.  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 He began to get weak and decided to stop for the day. The morning of admit 08/04/15 he went to work (works outside) and as soon as it started to warm up he again felt weak. He denied any chest pain or dyspnea. He denied any palpations. In the ED his EKG shows AF with RVR @ 126, poor ant RW. His B/P is a little low and his BUN was 24 suggesting he is mildly dehydrated. He was admitted and hydrated, IV heparin and dilt. It was noted he had sleep apnea and will need out pt sleep study.   His cardiac enzymes were negative. His TSH was normal. K+and Mg+ normal. He was eventually place on IV amiodarone and underwent TEE and DCCV on 08/05/15. EF was 45% with diffuse hypokinesis Tr MR. DCCV was successful and pt has maintained SR since.   He was seen by Dr. Katrinka Blazing and amiodarone was stopped. Metoprolol was added. His glucose off Metformin was elevated during hospitaltiztion.   Eliquis  added for 4 weeks and no asprin for that time. Plan for out pt sleep study.  F/u in afib clinic, 08/12/16. Pt states that he feels well. No return of afib. He returns to work next Tuesday and he does work outside in the heat. BP soft today at 98/64. He states that BP has been lower since metoprolol was added. He is not symptomatic with this currently. He  does have a sleep study pending. He is being compliant with eliquis and with a a chadsvasc score of at least 2(HYN, DM), it is felt that he should stay on anticoagulant.He has lost 150 lbs over the last couple of years, at one point weighting 468 lbs. He has plateaued with his weight  and plans to return to the gym and restart his walking 1-2 miles 2x a day. We discussed alcohol and afib connection and he has quit alcohol since hospitalization and plans  not drink alcohol going forward.   F/u in afib clinic 05/05/16.He recently was seen by [name], [name], for f/u and reported that he had not felt well recentlyy with more dizziness, fatigue.He was found to be in afib. It was recommended that he undergo a cpap titration trial due to return of afib but sleep study showed only mild sleep apnea last year. Pt states today that he does not want to pursue this. He states that it is all his fault that he went into afib as he had been non compliant with his meds. He was only taking BB occassionally and would often skip his anticoagulant.  He has increased his alcohol intake.He said before he did well with he followed the plan without afib.  F/u ina fib clinic for recent  ER visit 01/31/2018. He had been drinking a lot of beer over the holidays and had return of afib. He was rate controlled at time of ER visit and d/c home. Now in afib clinic he is back in SR. He converted about 2 hours after returning home from the ER. The ER MD suggested to go up on daily BB but this made him feel lightheaded. He has not had anymore alcohol and is writing this off. He continues on Multaq. He feels the holiday alcohol use was his trigger for recent afib episode.  Follow up in the AF clinic 03/20/19. Patient reports that on 03/16/19 he felt lightheaded and check his pulse oximeter which showed a rate in the 130s. He doubled his BB dose which resolved his symptoms. He remains in rate controlled afib today. Patient admits that the night  before, he had a glass of wine and 3-4 beers. Alcohol has been a trigger for his afib in the past. He is on Eliquis for a CHADS2VASC score of 2, he denies any missed doses of anticoagulation.   Follow up in the AF clinic 04/01/19. Patient is s/p DCCV on 03/25/19. He reports that he feels "100% better" since his procedure. He has not had any further heart racing or palpitations. He has not had any alcohol to drink recently.   Follow up in the AF clinic 04/29/19. Patient reports that on 04/26/19 he started having symptoms of lightheadedness and palpitations which has been typical of his afib symptoms in the past. He double his BB with good rate control. He denies specific triggers, he has had very little alcohol. He does admit he has gained about 30-40 lbs during the COVID pandemic.  Follow up in the AF clinic 05/07/19. Patient reports he felt he was back in SR not long after stopping Multaq. He then started flecainide after washout and has tolerated the medication well. He feels much better in SR with more energy. He has also starting walking with his wife for physical activity.  Follow up in the AF clinic 08/01/19. Patient reports he has done very well since his last visit. He has been aggressive with his lifestyle changes and has lost ~10 lbs. He denies heart racing or palpitations. He denies bleeding issues on anticoagulation.   Follow up in the AF clinic 04/01/19. Patient reports he has done well from a cardiac standpoint. He was diagnosed with COVID in the interim and is recovering. He denies any heart racing or palpitations. He denies any bleeding issues on anticoagulation.   Today, he denies symptoms of palpitations, chest pain, shortness of breath, orthopnea, PND, lower extremity edema, presyncope, syncope, or neurologic sequela. The patient is tolerating medications without difficulties and is otherwise without complaint today.   Past Medical History:  Diagnosis Date  . Allergy   . Atrial fibrillation  (HCC) 08/04/2015   CHA2DS2-VASc = 3  . CHF (congestive heart failure) (HCC) 2004   one episode  . Dehydration 08/04/2015  . Diabetes mellitus, type 2 (HCC) 11/29/2011  . Dyslipidemia 08/04/2015  . Essential hypertension 08/04/2015  . Hx of sleep apnea   . Hyperlipidemia   . Hypertension   . Normal coronary arteries 2004  . Obesity 11/29/2011  . Sleep apnea-untreated 12/16/2011   Pt denies having OSA when asked today   Past Surgical History:  Procedure Laterality Date  . CARDIAC CATHETERIZATION  2005  . CARDIOVERSION N/A 08/05/2015   Procedure: CARDIOVERSION;  Surgeon: Jake Bathe, MD;  Location: [facility] ENDOSCOPY;  Service: Cardiovascular;  Laterality: N/A;  . CARDIOVERSION N/A 07/19/2017   Procedure: CARDIOVERSION;  Surgeon: Vesta Mixer, MD;  Location: [facility] ENDOSCOPY;  Service: Cardiovascular;  Laterality: N/A;  . CARDIOVERSION N/A 03/25/2019   Procedure: CARDIOVERSION;  Surgeon: Jake Bathe, MD;  Location: [facility] ENDOSCOPY;  Service: Cardiovascular;  Laterality: N/A;  . HERNIA REPAIR    . TEE WITHOUT CARDIOVERSION N/A 08/05/2015   Procedure: TRANSESOPHAGEAL ECHOCARDIOGRAM (TEE);  Surgeon: Jake Bathe, MD;  Location: [facility] ENDOSCOPY;  Service: Cardiovascular;  Laterality: N/A;    Current Outpatient Medications  Medication Sig Dispense Refill  . acetaminophen (TYLENOL) 650 MG CR tablet Take 650 mg by mouth every 8 (eight) hours as needed for pain.    Marland Kitchen apixaban (ELIQUIS) 5 MG TABS tablet Take 1 tablet (5 mg total) by mouth 2 (two) times daily. 180 tablet 3  . flecainide (TAMBOCOR) 50 MG tablet Take 1 tablet by mouth twice daily 180 tablet 2  . glipiZIDE (GLUCOTROL) 10 MG tablet Take 10 mg by mouth 2 (two) times daily.    Marland Kitchen JARDIANCE 25 MG TABS tablet Take 25 mg by mouth daily.    Marland Kitchen lisinopril (ZESTRIL) 2.5 MG tablet Take 1 tablet (2.5 mg total) by mouth daily. 90 tablet 3  . metFORMIN (GLUCOPHAGE) 1000 MG tablet Take 1 tablet (1,000 mg total) by mouth 2 (two) times daily with a meal. 180  tablet 0  . metoprolol succinate (TOPROL-XL) 25 MG 24 hr tablet TAKE 1 TABLET BY MOUTH TWICE DAILY .  MAY  TAKE  AN  EXTRA  TABLET  AS  NEEDED  FOR  BREAKTHROUGH 180 tablet 0  . rosuvastatin (CRESTOR) 10 MG tablet TAKE 1 TABLET BY MOUTH ONCE DAILY FOR CHOLESTEROL LOWERING     No current facility-administered medications for this encounter.    No Known Allergies  Social History   Socioeconomic History  . Marital status: Married    Spouse name: Not on file  . Number of children: Not on file  . Years of education: Not on file  . Highest education level: Not on file  Occupational History  . Not on file  Tobacco Use  . Smoking status: Former Games developer  . Smokeless tobacco: Never Used  Vaping Use  . Vaping Use: Never used  Substance and Sexual Activity  . Alcohol use: Yes    Alcohol/week: 4.0 standard drinks    Types: 2 Glasses of wine, 2 Cans of beer per week    Comment: occ  . Drug use: No  . Sexual activity: Yes  Other Topics Concern  . 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   Social Determinants of Health   Financial Resource Strain: Not on file  Food Insecurity: Not on file  Transportation Needs: Not on file  Physical Activity: Not on file  Stress: Not on file  Social Connections: Not on file  Intimate Partner Violence: Not on file    Family History  Problem Relation Age of Onset  . Diabetes Mother     ROS- All systems are reviewed and negative except as per the HPI above  Physical Exam: Vitals:   04/01/20 0954  BP: 122/72  Pulse: 69  Weight: 123.6 kg  Height: 5\' 8"  (1.727 m)    GEN- The patient is a well appearing obese male, alert and oriented x 3 today.   HEENT-head normocephalic, atraumatic, sclera clear, conjunctiva pink, hearing intact,  trachea midline. Lungs- Clear to ausculation bilaterally, normal work of breathing Heart- Regular rate and rhythm, no murmurs, rubs or  gallops  GI- soft, NT, ND, + BS Extremities- no clubbing, cyanosis, or edema MS- no significant deformity or atrophy Skin- no rash or lesion Psych- euthymic mood, full affect Neuro- strength and sensation are intact   EKG- SR HR 69, PR 184, QRS 90, QTc 439  Echo 11/13/17 demonstrated  Left ventricle: The cavity size was mildly dilated. Systolic  function was normal. The estimated ejection fraction was in the  range of 60% to 65%. Wall motion was normal; there were no  regional wall motion abnormalities. Left ventricular diastolic  function parameters were normal.  - Aortic valve: Transvalvular velocity was within the normal range.  There was no stenosis. There was no regurgitation.  - Mitral valve: Transvalvular velocity was within the normal range.  There was no evidence for stenosis. There was trivial  regurgitation. Valve area by pressure half-time: 1.95 cm^2. Valve  area by continuity equation (using LVOT flow): 2.02 cm^2.  - Left atrium: The atrium was severely dilated.  - Right ventricle: The cavity size was normal. Wall thickness was  normal. Systolic function was normal.  - Atrial septum: No defect or patent foramen ovale was identified  by color flow Doppler.  - Tricuspid valve: There was no regurgitation.   Epic records reviewed  Assessment and Plan: 1. Persistent atrial fibrillation Patient appears to be maintaining SR. Continue flecainide 50 mg BID Continue Toprol 25 mg BID  Continue Eliquis 5 mg BID   This patients CHA2DS2-VASc Score and unadjusted Ischemic Stroke Rate (% per year) is equal to 2.2 % stroke rate/year from a score of 2  Above score calculated as 1 point each if present [CHF, HTN, DM, Vascular=MI/PAD/Aortic Plaque, Age if 65-74, or Male] Above score calculated as 2 points each if present [Age > 75, or Stroke/TIA/TE]  2. HTN Stable, no changes today.  3. OSA Patient reports that his last sleep study in 2017 showed mild  OSA which did not qualify for CPAP.   4. Obesity Body mass index is 41.42 kg/m. Lifestyle modification was discussed and encouraged including regular physical activity and weight reduction. Patient doing well with significant weight loss.    Follow up with Dr Katrinka Blazing per recall. AF clinic in one year.    Jorja Loa PA-C Afib Clinic [facility] [address] [place], [place] [address] [phone]

## 2020-04-01 ENCOUNTER — Other Ambulatory Visit: Payer: Self-pay

## 2020-04-01 ENCOUNTER — Ambulatory Visit (HOSPITAL_COMMUNITY)
Admission: RE | Admit: 2020-04-01 | Discharge: 2020-04-01 | Disposition: A | Discharge status: Home / Self Care | Payer: 59 | Source: Ambulatory Visit | Attending: Physician Assistant | Admitting: Physician Assistant

## 2020-04-01 ENCOUNTER — Encounter (HOSPITAL_COMMUNITY): Payer: Self-pay | Admitting: Physician Assistant

## 2020-04-01 VITALS — BP 122/72 | HR 69 | Ht 68.0 in | Wt 272.4 lb

## 2020-04-01 DIAGNOSIS — E669 Obesity, unspecified: Secondary | ICD-10-CM | POA: Insufficient documentation

## 2020-04-01 DIAGNOSIS — Z6841 Body Mass Index (BMI) 40.0 and over, adult: Secondary | ICD-10-CM | POA: Diagnosis not present

## 2020-04-01 DIAGNOSIS — Z87891 Personal history of nicotine dependence: Secondary | ICD-10-CM | POA: Diagnosis not present

## 2020-04-01 DIAGNOSIS — Z7984 Long term (current) use of oral hypoglycemic drugs: Secondary | ICD-10-CM | POA: Insufficient documentation

## 2020-04-01 DIAGNOSIS — Z79899 Other long term (current) drug therapy: Secondary | ICD-10-CM | POA: Diagnosis not present

## 2020-04-01 DIAGNOSIS — I4819 Other persistent atrial fibrillation: Secondary | ICD-10-CM | POA: Insufficient documentation

## 2020-04-01 DIAGNOSIS — E119 Type 2 diabetes mellitus without complications: Secondary | ICD-10-CM | POA: Insufficient documentation

## 2020-04-01 DIAGNOSIS — Z7901 Long term (current) use of anticoagulants: Secondary | ICD-10-CM | POA: Insufficient documentation

## 2020-04-01 DIAGNOSIS — Z7182 Exercise counseling: Secondary | ICD-10-CM | POA: Diagnosis not present

## 2020-04-01 DIAGNOSIS — G4733 Obstructive sleep apnea (adult) (pediatric): Secondary | ICD-10-CM | POA: Diagnosis not present

## 2020-04-01 DIAGNOSIS — E785 Hyperlipidemia, unspecified: Secondary | ICD-10-CM | POA: Diagnosis not present

## 2020-04-01 DIAGNOSIS — I10 Essential (primary) hypertension: Secondary | ICD-10-CM | POA: Insufficient documentation

## 2020-04-01 DIAGNOSIS — D6869 Other thrombophilia: Secondary | ICD-10-CM

## 2020-06-30 ENCOUNTER — Other Ambulatory Visit (HOSPITAL_COMMUNITY): Payer: Self-pay | Admitting: *Deleted

## 2020-06-30 MED ORDER — METOPROLOL SUCCINATE ER 25 MG PO TB24: ORAL_TABLET | ORAL | 2 refills | Status: DC

## 2020-06-30 MED ORDER — FLECAINIDE ACETATE 50 MG PO TABS: 50.0000 mg | ORAL_TABLET | Freq: Two times a day (BID) | ORAL | 2 refills | Status: DC

## 2020-06-30 MED ORDER — ELIQUIS 5 MG PO TABS: 5.0000 mg | ORAL_TABLET | Freq: Two times a day (BID) | ORAL | 3 refills | Status: DC

## 2020-10-07 ENCOUNTER — Other Ambulatory Visit: Payer: Self-pay | Admitting: Interventional Cardiology

## 2020-12-07 ENCOUNTER — Other Ambulatory Visit: Payer: Self-pay | Admitting: *Deleted

## 2020-12-07 ENCOUNTER — Other Ambulatory Visit (HOSPITAL_COMMUNITY): Payer: Self-pay | Admitting: *Deleted

## 2020-12-07 MED ORDER — APIXABAN 5 MG PO TABS: 5.0000 mg | ORAL_TABLET | Freq: Two times a day (BID) | ORAL | 2 refills | Status: DC

## 2020-12-07 MED ORDER — LISINOPRIL 2.5 MG PO TABS: 2.5000 mg | ORAL_TABLET | Freq: Every day | ORAL | 0 refills | Status: DC

## 2020-12-30 ENCOUNTER — Ambulatory Visit (HOSPITAL_COMMUNITY)
Admission: RE | Admit: 2020-12-30 | Discharge: 2020-12-30 | Disposition: A | Discharge status: Home / Self Care | Payer: 59 | Source: Ambulatory Visit | Attending: Physician Assistant | Admitting: Physician Assistant

## 2020-12-30 ENCOUNTER — Other Ambulatory Visit: Payer: Self-pay

## 2020-12-30 VITALS — BP 112/80 | HR 114 | Ht 68.0 in | Wt 291.0 lb

## 2020-12-30 DIAGNOSIS — E785 Hyperlipidemia, unspecified: Secondary | ICD-10-CM | POA: Insufficient documentation

## 2020-12-30 DIAGNOSIS — E119 Type 2 diabetes mellitus without complications: Secondary | ICD-10-CM | POA: Insufficient documentation

## 2020-12-30 DIAGNOSIS — Z01810 Encounter for preprocedural cardiovascular examination: Secondary | ICD-10-CM | POA: Insufficient documentation

## 2020-12-30 DIAGNOSIS — R079 Chest pain, unspecified: Secondary | ICD-10-CM | POA: Diagnosis not present

## 2020-12-30 DIAGNOSIS — D6869 Other thrombophilia: Secondary | ICD-10-CM

## 2020-12-30 DIAGNOSIS — Z7901 Long term (current) use of anticoagulants: Secondary | ICD-10-CM | POA: Insufficient documentation

## 2020-12-30 DIAGNOSIS — I509 Heart failure, unspecified: Secondary | ICD-10-CM | POA: Diagnosis not present

## 2020-12-30 DIAGNOSIS — Z6841 Body Mass Index (BMI) 40.0 and over, adult: Secondary | ICD-10-CM | POA: Diagnosis not present

## 2020-12-30 DIAGNOSIS — G4733 Obstructive sleep apnea (adult) (pediatric): Secondary | ICD-10-CM | POA: Diagnosis not present

## 2020-12-30 DIAGNOSIS — E669 Obesity, unspecified: Secondary | ICD-10-CM | POA: Insufficient documentation

## 2020-12-30 DIAGNOSIS — R0602 Shortness of breath: Secondary | ICD-10-CM | POA: Diagnosis not present

## 2020-12-30 DIAGNOSIS — I4819 Other persistent atrial fibrillation: Secondary | ICD-10-CM | POA: Diagnosis not present

## 2020-12-30 DIAGNOSIS — I110 Hypertensive heart disease with heart failure: Secondary | ICD-10-CM | POA: Insufficient documentation

## 2020-12-30 MED ORDER — FLECAINIDE ACETATE 50 MG PO TABS: 100.0000 mg | ORAL_TABLET | Freq: Two times a day (BID) | ORAL | 2 refills | Status: DC

## 2020-12-30 NOTE — Patient Instructions (Signed)
Increase flecainide to 100mg  twice a day   Cardioversion scheduled for Wednesday, December 21st  - Arrive at the December 23 and go to admitting at [facility] not eat or drink anything after midnight the night prior to your procedure.  - Take all your morning medication (except diabetic medications) with a sip of water prior to arrival.  - You will not be able to drive home after your procedure.  - Do NOT miss any doses of your blood thinner - if you should miss a dose please notify our office immediately.  - If you feel as if you go back into normal rhythm prior to scheduled cardioversion, please notify our office immediately. If your procedure is canceled in the cardioversion suite you will be charged a cancellation fee. Patients will be asked to: to mask in public and hand hygiene (no longer quarantine) in the 3 days prior to surgery, to report if any COVID-19-like illness or household contacts to COVID-19 to determine need for testing

## 2020-12-30 NOTE — Progress Notes (Signed)
Patient ID: Juan Schwartz, male   DOB: July 15, 1965, 55 y.o.   MRN: [id number]     Primary Care Physician: Patient, No Pcp Per (Inactive) Referring Physician: [facility] F/U Cardiologist: Dr. Tamala Julian Primary EP: Dr Delton See Juan Schwartz is a 55 y.o. AA male with a h/o obesity, HTN, untreated sleep apnea, daily ETOH ("2 beers a day"), HLD, and NIDDM. He was seen by cardiology in 2004 when he presented with chest pain and SOB. An echo initially showed an EF of 35%. He was cathed by Dr Rex Kras and had normal coronaries and an EF of 50%. It was felt he had an episode of CHF then but he has not had any further episodes.     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 He began to get weak and decided to stop for the day. The morning of admit 08/04/15 he went to work (works outside) and as soon as it started to warm up he again felt weak. He denied any chest pain or dyspnea. He denied any palpations. In the ED his EKG shows AF with RVR @ 126, poor ant RW. His B/P is a little low and his BUN was 24 suggesting he is mildly dehydrated.  He was admitted and hydrated, IV heparin and dilt.  It was noted he had sleep apnea and will need out pt sleep study.    His cardiac enzymes were negative.  His TSH was normal.  K+and Mg+ normal. He was eventually place on IV amiodarone and underwent TEE and DCCV on 08/05/15.  EF was 45% with diffuse hypokinesis Tr MR.  DCCV was successful and pt has maintained SR since.    He was seen by Dr. Tamala Julian and amiodarone was stopped.  Metoprolol was added. His glucose off Metformin was elevated during hospitaltiztion.   Eliquis  added for 4 weeks and no asprin for that time.  Plan for out pt sleep study.   F/u in afib clinic, 08/12/16. Pt states that he feels well. No return of afib. He returns to work next Tuesday and he does work outside in the [employer]. BP soft today at 98/64. He states that BP has been lower since metoprolol was added. He is not symptomatic with this  currently. He does have a sleep study pending. He is being compliant with eliquis and with a a chadsvasc score of at least 2(HYN, DM), it is felt that he should stay on anticoagulant.  He has lost 150 lbs over the last couple of years, at one point weighting 468 lbs. He has plateaued with his weight  and plans to return to the gym and restart his walking 1-2 miles 2x a day. We discussed alcohol and afib connection and he has quit alcohol since hospitalization and plans  not drink alcohol going forward.   F/u in afib clinic 05/05/16.He recently was seen by K. Grandville Silos, [place], for f/u and reported that he had not felt well recentlyy with more dizziness, fatigue.He was found to be in afib. It was recommended that he undergo a cpap titration trial due to return of afib but sleep study showed only mild sleep apnea last year. Pt states today that he does not want to pursue this. He states that it is all his fault that he went into afib as he had been non compliant with his meds. He was only taking BB occassionally and would often skip his anticoagulant.  He has increased his alcohol intake.He said before  he did well with he followed the plan without afib.  F/u ina fib clinic for recent ER visit 01/31/2018. He had been drinking a lot of beer over the holidays and had return of afib. He was rate controlled at time of ER visit and d/c home. Now in afib clinic he is back in [place]. He converted about 2 hours after returning home from the ER. The ER MD suggested to go up on daily BB but this made him feel lightheaded. He has not had anymore alcohol and is writing this off. He continues on Multaq. He feels the holiday alcohol use was his trigger for recent afib episode.  Follow up in the AF clinic 03/20/19. Patient reports that on 03/16/19 he felt lightheaded and check his pulse oximeter which showed a rate in the 130s. He doubled his BB dose which resolved his symptoms. He remains in rate controlled afib today. Patient admits that  the night before, he had a glass of wine and 3-4 beers. Alcohol has been a trigger for his afib in the past. He is on Eliquis for a CHADS2VASC score of 2, he denies any missed doses of anticoagulation.   Follow up in the AF clinic 04/01/19. Patient is s/p DCCV on 03/25/19. He reports that he feels "100% better" since his procedure. He has not had any further heart racing or palpitations. He has not had any alcohol to drink recently.   Follow up in the AF clinic 04/29/19. Patient reports that on 04/26/19 he started having symptoms of lightheadedness and palpitations which has been typical of his afib symptoms in the past. He double his BB with good rate control. He denies specific triggers, he has had very little alcohol. He does admit he has gained about 30-40 lbs during the [place] pandemic.  Follow up in the AF clinic 05/07/19. Patient reports he felt he was back in SR not long after stopping Multaq. He then started flecainide after washout and has tolerated the medication well. He feels much better in SR with more energy. He has also starting walking with his wife for physical activity.  Follow up in the AF clinic 08/01/19. Patient reports he has done very well since his last visit. He has been aggressive with his lifestyle changes and has lost ~10 lbs. He denies heart racing or palpitations. He denies bleeding issues on anticoagulation.   Follow up in the AF clinic 04/01/19. Patient reports he has done well from a cardiac standpoint. He was diagnosed with COVID in the interim and is recovering. He denies any heart racing or palpitations. He denies any bleeding issues on anticoagulation.   Follow up in the AF clinic 12/30/20. Patient reports that he felt palpitations, lightheadedness, and diaphoresis on 12/25/20. He has been under a lot of stress recently with his wife's health. He also states he had a "few drinks" the day before the onset of his symptoms. He is in afib today. No missed doses of anticoagulation.    Today, he denies symptoms of chest pain, shortness of breath, orthopnea, PND, lower extremity edema, presyncope, syncope, or neurologic sequela. The patient is tolerating medications without difficulties and is otherwise without complaint today.   Past Medical History:  Diagnosis Date   Allergy    Atrial fibrillation ([place]) 08/04/2015   CHA2DS2-VASc = 3   CHF (congestive heart failure) ([place]) 2004   one episode   Dehydration 08/04/2015   Diabetes mellitus, type 2 ([place]) 11/29/2011   Dyslipidemia 08/04/2015   Essential hypertension  08/04/2015   Hx of sleep apnea    Hyperlipidemia    Hypertension    Normal coronary arteries 2004   Obesity 11/29/2011   Sleep apnea-untreated 12/16/2011   Pt denies having OSA when asked today   Past Surgical History:  Procedure Laterality Date   CARDIAC CATHETERIZATION  2005   CARDIOVERSION N/A 08/05/2015   Procedure: CARDIOVERSION;  Surgeon: Jerline Pain, MD;  Location: [place];  Service: Cardiovascular;  Laterality: N/A;   CARDIOVERSION N/A 07/19/2017   Procedure: CARDIOVERSION;  Surgeon: Thayer Headings, MD;  Location: [place];  Service: Cardiovascular;  Laterality: N/A;   CARDIOVERSION N/A 03/25/2019   Procedure: CARDIOVERSION;  Surgeon: Jerline Pain, MD;  Location: [place];  Service: Cardiovascular;  Laterality: N/A;   HERNIA REPAIR     TEE WITHOUT CARDIOVERSION N/A 08/05/2015   Procedure: TRANSESOPHAGEAL ECHOCARDIOGRAM (TEE);  Surgeon: Jerline Pain, MD;  Location[facility] END[facility]COPY;  Service: Cardiovascular;  Laterality: N/A;    Current Outpatient Medications  Medication Sig Dispense Refill   acetaminophen (TYLENOL) 650 MG CR tablet Take 650 mg by mouth every 8 (eight) hours as needed for pain.     apixaban (ELIQUIS) 5 MG TABS tablet Take 1 tablet (5 mg total) by mouth 2 (two) times daily. 180 tablet 2   flecainide (TAMBOCOR) 50 MG tablet Take 1 tablet (50 mg total) by mouth 2 (two) times daily. 180 tablet 2   glipiZIDE (GLUCOTROL) 10 MG  tablet Take 10 mg by mouth 2 (two) times daily.     JARDIANCE 25 MG TABS tablet Take 25 mg by mouth daily.     lisinopril (ZESTRIL) 2.5 MG tablet Take 1 tablet (2.5 mg total) by mouth daily. Please make overdue appt with Dr. Tamala Julian before anymore refills. Thank you 1st attempt 90 tablet 0   meloxicam (MOBIC) 15 MG tablet Take by mouth.     metFORMIN (GLUCOPHAGE) 1000 MG tablet Take 1 tablet (1,000 mg total) by mouth 2 (two) times daily with a meal. 180 tablet 0   metoprolol succinate (TOPROL-XL) 25 MG 24 hr tablet TAKE 1 TABLET BY MOUTH TWICE DAILY .  MAY  TAKE  AN  EXTRA  TABLET  AS  NEEDED  FOR  BREAKTHROUGH 200 tablet 2   No current facility-administered medications for this encounter.    No Known Allergies  Social History   Socioeconomic History   Marital status: Married    Spouse name: Not on file   Number of children: Not on file   Years of education: Not on file   Highest education level: Not on file  Occupational History   Not on file  Tobacco Use   Smoking status: Former   Smokeless tobacco: Never  Vaping Use   Vaping Use: Never used  Substance and Sexual Activity   Alcohol use: Yes    Alcohol/week: 4.0 standard drinks    Types: 2 Glasses of wine, 2 Cans of beer per week    Comment: occ   Drug use: No   Sexual activity: Yes  Other Topics Concern   Not on file  Social History Narrative   Lives in [place]   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   Social Determinants of Health   Financial Resource Strain: Not on file  Food Insecurity: Not on file  Transportation Needs: Not on file  Physical Activity: Not on file  Stress: Not on file  Social Connections: Not on file  Intimate Partner Violence: Not on file  Family History  Problem Relation Age of Onset   Diabetes Mother     ROS- All systems are reviewed and negative except as per the HPI above  Physical Exam: Vitals:   12/30/20 1447  BP: 112/80  Pulse: (!) 114  Weight: 132 kg   Height: 5\' 8"  (1.727 m)    GEN- The patient is a well appearing obese male, alert and oriented x 3 today.   HEENT-head normocephalic, atraumatic, sclera clear, conjunctiva pink, hearing intact, trachea midline. Lungs- Clear to ausculation bilaterally, normal work of breathing Heart- irregular rate and rhythm, no murmurs, rubs or gallops  GI- soft, NT, ND, + BS Extremities- no clubbing, cyanosis, or edema MS- no significant deformity or atrophy Skin- no rash or lesion Psych- euthymic mood, full affect Neuro- strength and sensation are intact   EKG- afib Vent. rate 114 BPM PR interval * ms QRS duration 88 ms QT/QTcB 322/443 ms  Echo 11/13/17 demonstrated  Left ventricle: The cavity size was mildly dilated. Systolic    function was normal. The estimated ejection fraction was in the    range of 60% to 65%. Wall motion was normal; there were no    regional wall motion abnormalities. Left ventricular diastolic    function parameters were normal.  - Aortic valve: Transvalvular velocity was within the normal range.    There was no stenosis. There was no regurgitation.  - Mitral valve: Transvalvular velocity was within the normal range.    There was no evidence for stenosis. There was trivial    regurgitation. Valve area by pressure half-time: 1.95 cm^2. Valve    area by continuity equation (using LVOT flow): 2.02 cm^2.  - Left atrium: The atrium was severely dilated.  - Right ventricle: The cavity size was normal. Wall thickness was    normal. Systolic function was normal.  - Atrial septum: No defect or patent foramen ovale was identified    by color flow Doppler.  - Tricuspid valve: There was no regurgitation.   Epic records reviewed  Assessment and Plan: 1. Persistent atrial fibrillation Patient back in afib today.  Will increase flecainide to 100 mg BID and will plan for DCCV if he does not chemically convert.  Continue Toprol 25 mg BID  Continue Eliquis 5 mg BID. Patient  denies any missed doses in the last 3 weeks.  Stressed importance of alcohol avoidance.   This patients CHA2DS2-VASc Score and unadjusted Ischemic Stroke Rate (% per year) is equal to 2.2 % stroke rate/year from a score of 2  Above score calculated as 1 point each if present [CHF, HTN, DM, Vascular=MI/PAD/Aortic Plaque, Age if 65-74, or Male] Above score calculated as 2 points each if present [Age > 75, or Stroke/TIA/TE]  2. HTN Stable, no changes today.  3. OSA Patient reports that his last sleep study in 2017 showed mild OSA which did not qualify for CPAP.   4. Obesity Body mass index is 44.25 kg/m. Lifestyle modification was discussed and encouraged including regular physical activity and weight reduction.   Follow up in the AF clinic next week for ECG.    2018 PA-C Afib Clinic [facility] [address] [place], [place] [phone]

## 2021-01-04 ENCOUNTER — Ambulatory Visit (HOSPITAL_COMMUNITY)
Admission: RE | Admit: 2021-01-04 | Discharge: 2021-01-04 | Disposition: A | Discharge status: Home / Self Care | Payer: 59 | Source: Ambulatory Visit | Attending: Physician Assistant | Admitting: Physician Assistant

## 2021-01-04 ENCOUNTER — Other Ambulatory Visit: Payer: Self-pay

## 2021-01-04 ENCOUNTER — Encounter (HOSPITAL_COMMUNITY): Payer: Self-pay | Admitting: Internal Medicine

## 2021-01-04 VITALS — HR 67

## 2021-01-04 DIAGNOSIS — I4819 Other persistent atrial fibrillation: Secondary | ICD-10-CM | POA: Diagnosis present

## 2021-01-04 MED ORDER — FLECAINIDE ACETATE 50 MG PO TABS: 50.0000 mg | ORAL_TABLET | Freq: Two times a day (BID) | ORAL | 2 refills | Status: DC

## 2021-01-04 NOTE — Progress Notes (Signed)
Patient returns for ECG after increasing flecainide. He felt he was back in SR. ECG confirms SR HR 67, PR 180, QRS 90, QTc 422. He decreased his flecainide back to 50 mg BID. Will cancel DCCV.

## 2021-01-06 ENCOUNTER — Encounter (HOSPITAL_COMMUNITY): Payer: 59 | Admitting: Physician Assistant

## 2021-01-13 ENCOUNTER — Encounter (HOSPITAL_COMMUNITY): Payer: Self-pay

## 2021-01-13 ENCOUNTER — Ambulatory Visit (HOSPITAL_COMMUNITY): Admit: 2021-01-13 | Payer: 59 | Admitting: Internal Medicine

## 2021-01-13 SURGERY — CARDIOVERSION
Anesthesia: Monitor Anesthesia Care

## 2021-02-01 ENCOUNTER — Encounter: Payer: Self-pay | Admitting: Physician Assistant

## 2021-02-01 NOTE — Progress Notes (Deleted)
Cardiology Office Note    Date:  02/01/2021   ID:  DEMARKIS GHEEN, DOB 09/20/1965, MRN [id number]  PCP:  Patient, No Pcp Per (Inactive)  Cardiologist:  Lesleigh Noe, MD  Electrophysiologist:  None   Chief Complaint: ***  History of Present Illness:   Juan Schwartz is a 56 y.o. male with history of paroxysmal atrial fibrillation, nonischemic cardiomyopathy, HTN, HLD, DM2, obesity, reported mild OSA (not on CPAP), habitual ETOH who is here for follow-up. He is followed by Dr. Katrinka Blazing but has also seen Dr. Johney Frame and the afib clinic before. He was initially seen by cardiology in 2004 when he presented with CP/SOB. He was found to have EF 35-40% by echo and normal coronaries at that time. Do not have access to any interim cardiac follow-up between 2004-2017 when he re-presented with atrial fibrillation. 2D echo 10/2015 showed EF 45-50% and TEE same time 40-45%. e was placed on IV amiodarone and underwent TEE/DCCV.  Sleep study 10/2015 showed very mild OSA. CPAP not prescribed at that time. Amiodarone was later stopped. He was on Multaq for a period time. Last echo 10/2017 showed mildly dilated LV, EF 60-65%, severely dilated LA. He had recurrent DCCVs in 06/2017 and 03/2019. Multaq was stopped in 04/2019 when he went back into atrial fibrillation and flecainide was started. ETT 06/2019 was non-diagnostic due to 75% MPHR achieved but no concerning interval changes or ischemic changes noted per result note. He was recently seen back in the afib clinic 12/30/20 and was back in atrial fib so Jorja Loa PA-C recommended to increase flecainide to  BID and plan DCCV if he did not convert. He was in sinus on repeat EKG 01/04/21.  Of note, last labs 02/2020 demonstrated thrombocytopenia, hyperkalemia and hyponatremia in the setting of hyperglycemia.  Plt count, hyperkalemia, hyponatremia Who follows lipids? 03/2021 Afib  Paroxysmal atrial fibrillation Nonischemic cardiomyopathy Essential  HTN Hyperlipidemia    Labwork independently reviewed: 02/2020 Hgb 13.8, Plt 108, K 5.2, Cr 130 in setting of glucose 396 2017 TSH wnl   Cardiology Studies:   Studies reviewed are outlined and summarized above. Reports included below if pertinent.   2d echo 2019 - Left ventricle: The cavity size was mildly dilated. Systolic    function was normal. The estimated ejection fraction was in the    range of 60% to 65%. Wall motion was normal; there were no    regional wall motion abnormalities. Left ventricular diastolic    function parameters were normal.  - Aortic valve: Transvalvular velocity was within the normal range.    There was no stenosis. There was no regurgitation.  - Mitral valve: Transvalvular velocity was within the normal range.    There was no evidence for stenosis. There was trivial    regurgitation. Valve area by pressure half-time: 1.95 cm^2. Valve    area by continuity equation (using LVOT flow): 2.02 cm^2.  - Left atrium: The atrium was severely dilated.  - Right ventricle: The cavity size was normal. Wall thickness was    normal. Systolic function was normal.  - Atrial septum: No defect or patent foramen ovale was identified    by color flow Doppler.  - Tricuspid valve: There was no regurgitation.   ETT 06/2019 Blood pressure demonstrated a normal response to exercise. There was no ST segment deviation noted during stress. No T wave inversion was noted during stress.   1. Non-diagnostic ECG treadmill stress test for ischemia (only achieved 75% MPHR). 2.  No ischemic changes observed but cannot exclude at higher heart rate.  3. Average exercise capacity (4:29 min:s; 7 METS). 4. Normal HR/BP response to exercise.   Cardiac Cath 2004 CARDIAC CATHETERIZATION    PROCEDURE:  Cardiac catheterization.    INDICATIONS:  The patient is a 56 year old male who presented to urgent care  with new onset of exertional chest pain and marked breathlessness.  His EKG  is  unremarkable, and his cardiac enzymes were negative, but his chest x-ray  showed mild congestive heart failure.  An echocardiogram showed an ejection  fraction of 35-45% and because of that, he is brought to the catheterization  lab for cardiac catheterization.    DESCRIPTION OF PROCEDURE:  After obtaining informed consent, the patient was  prepared and draped in the usual sterile fashion exposing the right groin.  Following local anesthetic with 1% Xylocaine, the Seldinger technique was  employed, and after multiple attempts the artery was finally entered with  the aid of a Smart needle.  The 5 French introducer sheath was then placed  in the right femoral artery.  Left and right coronary arteriography and  ventriculography in both the RAO and LAO projection was performed.    COMPLICATIONS:  None.    EQUIPMENT:  5 French Judkins configuration catheters.    TOTAL CONTRAST:  150 mL.    HEMODYNAMIC MONITORING:  Central aortic pressure 141/92 with a left  ventricular pressure of 141/15 and no significant aortic valve gradient  noted at the time of pullback.    VENTRICULOGRAPHY:  Ventriculography in both the LAO and ROA projection  revealed the left ventricle to be dilated.  The ejection fraction, however,  was approximately 50%, and there was no evidence of mitral regurgitation.  The end-diastolic pressure was slightly elevated at 23, and there were no  focal wall motion abnormalities.    CORONARY ARTERIOGRAPHY:  1. Left main:  Normal.  2. Left anterior descending:  The LAD extended down to the apex of the heart     and had small diagonal branches.  These were all free of disease.  3. Circumflex:  The circumflex  was a very large dominant vessel with three     obtuse marginals and the PDA, all of which were free of disease.  4. Right coronary artery:  The right coronary artery is a nondominant vessel     supplying only the right ventricle, free of disease.    CONCLUSION:  1. No  evidence of coronary artery disease.  2. Dilatation of the left ventricle with approximately 50% ejection fraction     and slight elevated left ventricular end-diastolic pressure.    PLAN:  At this point, the patient will be started on an ACE inhibitor and  continued on mild diuretics.  He should be ready for discharge either later  today or tomorrow depending on his right groin and his ability to ambulate  without significant restlessness.                                                  Thereasa Solo. Little, M.D.      ABL/MEDQ  D:  04/10/2002  T:  04/11/2002  Job:  [id number]       Past Medical History:  Diagnosis Date   Allergy    Atrial fibrillation ([facility]) 08/04/2015   CHA2DS2-VASc = 3  CHF (congestive heart failure) ([facility]) 2004   one episode   Dehydration 08/04/2015   Diabetes mellitus, type 2 ([facility]) 11/29/2011   Dyslipidemia 08/04/2015   Essential hypertension 08/04/2015   Hx of sleep apnea    Hyperlipidemia    Hypertension    Normal coronary arteries 2004   Obesity 11/29/2011   Sleep apnea-untreated 12/16/2011   Pt denies having OSA when asked today    Past Surgical History:  Procedure Laterality Date   CARDIAC CATHETERIZATION  2005   CARDIOVERSION N/A 08/05/2015   Procedure: CARDIOVERSION;  Surgeon: Jake Bathe, MD;  Location: [facility] ENDOSCOPY;  Service: Cardiovascular;  Laterality: N/A;   CARDIOVERSION N/A 07/19/2017   Procedure: CARDIOVERSION;  Surgeon: Vesta Mixer, MD;  Location[facility] ENDOSCOPY;  Service: Cardiovascular;  Laterality: N/A;   CARDIOVERSION N/A 03/25/2019   Procedure: CARDIOVERSION;  Surgeon: Jake Bathe, MD;  Location: [facility] ENDOSCOPY;  Service: Cardiovascular;  Laterality: N/A;   HERNIA REPAIR     TEE WITHOUT CARDIOVERSION N/A 08/05/2015   Procedure: TRANSESOPHAGEAL ECHOCARDIOGRAM (TEE);  Surgeon: Jake Bathe, MD;  Location: [facility] ENDOSCOPY;  Service: Cardiovascular;  Laterality: N/A;    Current Medications: No outpatient medications have been marked as  taking for the 02/03/21 encounter (Appointment) with Laurann Montana, PA-C.   ***   Allergies:   Patient has no known allergies.   Social History   Socioeconomic History   Marital status: Married    Spouse name: Not on file   Number of children: Not on file   Years of education: Not on file   Highest education level: Not on file  Occupational History   Not on file  Tobacco Use   Smoking status: Former   Smokeless tobacco: Never  Vaping Use   Vaping Use: Never used  Substance and Sexual Activity   Alcohol use: Yes    Alcohol/week: 4.0 standard drinks    Types: 2 Glasses of wine, 2 Cans of beer per week    Comment: occ   Drug use: No   Sexual activity: Yes  Other Topics Concern   Not on file  Social History Narrative   Lives in [place]   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   Social Determinants of Health   Financial Resource Strain: Not on file  Food Insecurity: Not on file  Transportation Needs: Not on file  Physical Activity: Not on file  Stress: Not on file  Social Connections: Not on file     Family History:  The patient's ***family history includes Diabetes in his mother.  ROS:   Please see the history of present illness. Otherwise, review of systems is positive for ***.  All other systems are reviewed and otherwise negative.    EKG(s)/Additional Labs   EKG:  EKG is ordered today, personally reviewed, demonstrating ***  Recent Labs: 03/08/2020: BUN 13; Creatinine, Ser 0.96; Hemoglobin 13.8; Platelets 108; Potassium 5.2; Sodium 130  Recent Lipid Panel    Component Value Date/Time   CHOL 182 08/25/2012 0818   TRIG 193 (H) 08/25/2012 0818   HDL 48 08/25/2012 0818   CHOLHDL 3.8 08/25/2012 0818   VLDL 39 08/25/2012 0818   LDLCALC 95 08/25/2012 0818    PHYSICAL EXAM:    VS:  There were no vitals taken for this visit.  BMI: There is no height or weight on file to calculate BMI.  GEN: Well nourished, well developed male in no acute  distress HEENT: normocephalic, atraumatic Neck: no JVD, carotid  bruits, or masses Cardiac: ***RRR; no murmurs, rubs, or gallops, no edema  Respiratory:  clear to auscultation bilaterally, normal work of breathing GI: soft, nontender, nondistended, + BS MS: no deformity or atrophy Skin: warm and dry, no rash Neuro:  Alert and Oriented x 3, Strength and sensation are intact, follows commands Psych: euthymic mood, full affect  Wt Readings from Last 3 Encounters:  12/30/20 291 lb (132 kg)  04/01/20 272 lb 6.4 oz (123.6 kg)  10/17/19 287 lb 6.4 oz (130.4 kg)     ASSESSMENT & PLAN:   ***     Disposition: F/u with ***   Medication Adjustments/Labs and Tests Ordered: Current medicines are reviewed at length with the patient today.  Concerns regarding medicines are outlined above. Medication changes, Labs and Tests ordered today are summarized above and listed in the Patient Instructions accessible in Encounters.   Signed, Laurann Montana, PA-C  02/01/2021 1:49 PM    [facility] Phone: [phone]; Fax: [phone]

## 2021-02-03 ENCOUNTER — Ambulatory Visit: Payer: 59 | Admitting: Physician Assistant

## 2021-02-03 DIAGNOSIS — I10 Essential (primary) hypertension: Secondary | ICD-10-CM

## 2021-02-03 DIAGNOSIS — I480 Paroxysmal atrial fibrillation: Secondary | ICD-10-CM

## 2021-02-03 DIAGNOSIS — E785 Hyperlipidemia, unspecified: Secondary | ICD-10-CM

## 2021-02-03 DIAGNOSIS — I428 Other cardiomyopathies: Secondary | ICD-10-CM

## 2021-02-09 ENCOUNTER — Encounter: Payer: Self-pay | Admitting: Physician Assistant

## 2021-02-09 ENCOUNTER — Ambulatory Visit: Payer: 59 | Admitting: Physician Assistant

## 2021-02-09 ENCOUNTER — Other Ambulatory Visit: Payer: Self-pay

## 2021-02-09 VITALS — BP 128/78 | HR 84 | Ht 68.0 in | Wt 293.0 lb

## 2021-02-09 DIAGNOSIS — I4819 Other persistent atrial fibrillation: Secondary | ICD-10-CM | POA: Diagnosis not present

## 2021-02-09 DIAGNOSIS — I480 Paroxysmal atrial fibrillation: Secondary | ICD-10-CM

## 2021-02-09 DIAGNOSIS — E785 Hyperlipidemia, unspecified: Secondary | ICD-10-CM

## 2021-02-09 DIAGNOSIS — G4730 Sleep apnea, unspecified: Secondary | ICD-10-CM | POA: Diagnosis not present

## 2021-02-09 DIAGNOSIS — I10 Essential (primary) hypertension: Secondary | ICD-10-CM

## 2021-02-09 DIAGNOSIS — E119 Type 2 diabetes mellitus without complications: Secondary | ICD-10-CM

## 2021-02-09 MED ORDER — ROSUVASTATIN CALCIUM 10 MG PO TABS: 10.0000 mg | ORAL_TABLET | Freq: Every day | ORAL | 3 refills | Status: DC

## 2021-02-09 MED ORDER — METOPROLOL SUCCINATE ER 25 MG PO TB24: ORAL_TABLET | ORAL | 3 refills | Status: DC

## 2021-02-09 MED ORDER — APIXABAN 5 MG PO TABS: 5.0000 mg | ORAL_TABLET | Freq: Two times a day (BID) | ORAL | 1 refills | Status: DC

## 2021-02-09 MED ORDER — LISINOPRIL 2.5 MG PO TABS: 2.5000 mg | ORAL_TABLET | Freq: Every day | ORAL | 3 refills | Status: DC

## 2021-02-09 NOTE — Patient Instructions (Signed)
Medication Instructions: Start Rosuvastatin (Crestor) 10 mg daily   *If you need a refill on your cardiac medications before your next appointment, please call your pharmacy*   Lab Work: Your physician recommends that you return for a FASTING lipid profile on Monday April 17, Lab is open from 7:15 am to 4:30 pm  If you have labs (blood work) drawn today and your tests are completely normal, you will receive your results only by: [place] (if you have MyChart) OR A paper copy in the mail If you have any lab test that is abnormal or we need to change your treatment, we will call you to review the results.   Testing/Procedures: None ordered    Follow-Up: At [facility] you and your health needs are our priority.  As part of our continuing mission to provide you with exceptional heart care, we have created designated Provider Care Teams.  These Care Teams include your primary Cardiologist (physician) and Advanced Practice Providers (APPs -  Physician Assistants and Nurse Practitioners) who all work together to provide you with the care you need, when you need it.  We recommend signing up for the patient portal called "MyChart".  Sign up information is provided on this After Visit Summary.  MyChart is used to connect with patients for Virtual Visits (Telemedicine).  Patients are able to view lab/test results, encounter notes, upcoming appointments, etc.  Non-urgent messages can be sent to your provider as well.   To learn more about what you can do with MyChart, go to [email or web address].    Your next appointment:   6 month(s)  The format for your next appointment:   In Person  Provider:   Sinclair Grooms, MD     Other Instructions None

## 2021-02-09 NOTE — Telephone Encounter (Signed)
Prescription refill request for Eliquis received. Indication: afib  Last office visit: [place], 02/09/2021 Scr: 0.96, 03/08/2020 Age: 56 yo  Weight:  132.9 kg   Refill sent.

## 2021-02-09 NOTE — Progress Notes (Signed)
Cardiology Office Note    Date:  02/09/2021   ID:  Juan Schwartz, DOB [birth year], MRN [id number]   PCP:  Patient, No Pcp Per (Inactive)   [place]  Cardiologist:  Juan Grooms, MD   Advanced Practice Provider:  No care team member to display Electrophysiologist:  None   [id number]   Chief Complaint  Patient presents with   Follow-up    History of Present Illness:  Juan Schwartz is a 57 y.o. male with a hx of  Obesity, hypertension, OSA, ETOH use, hyperlipidemia, DM II, paroxysmal atrial fibrillation, mild reduction in LV function with mildly depressed LVEF in the 45%, 60-65% on echo 10/2017, chronic anticoagulation (CHADS VASC 3).  Last saw Dr. Tamala Schwartz 10/17/19 and doing well. Followed in Afib clinic and flecainide recently increased briefly and was back in NSR and didn't require DCCV. Flecainide decreased 50 mg bid.  Patient comes in for f/u. 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 Denies chest pain, dyspnea, palpitations. No regular exercise recently. Has a free gym at work. Had labs at [facility] urgent care last month. Drinks 2-3 12 ounce beer 3 days/week. A1C up recently. Previously on rosuvastatin but no longer.    Past Medical History:  Diagnosis Date   Allergy    Atrial fibrillation ([place]) 08/04/2015   CHA2DS2-VASc = 3   Dehydration 08/04/2015   Diabetes mellitus, type 2 ([place]) 11/29/2011   Essential hypertension 08/04/2015   Hyperlipidemia    Hypertension    Mild sleep apnea 2017   NICM (nonischemic cardiomyopathy) ([place])    Normal coronary arteries 2004   Obesity 11/29/2011    Past Surgical History:  Procedure Laterality Date   CARDIAC CATHETERIZATION  2005   CARDIOVERSION N/A 08/05/2015   Procedure: CARDIOVERSION;  Surgeon: Jerline Pain, MD;  Location: [place];  Service: Cardiovascular;  Laterality: N/A;   CARDIOVERSION N/A 07/19/2017   Procedure: CARDIOVERSION;   Surgeon: Thayer Headings, MD;  Location: [place];  Service: Cardiovascular;  Laterality: N/A;   CARDIOVERSION N/A 03/25/2019   Procedure: CARDIOVERSION;  Surgeon: Jerline Pain, MD;  Location: [facility];  Service: Cardiovascular;  Laterality: N/A;   HERNIA REPAIR     TEE WITHOUT CARDIOVERSION N/A 08/05/2015   Procedure: TRANSESOPHAGEAL ECHOCARDIOGRAM (TEE);  Surgeon: Jerline Pain, MD;  Location: [facility];  Service: Cardiovascular;  Laterality: N/A;    Current Medications: Current Meds  Medication Sig   acetaminophen (TYLENOL) 650 MG CR tablet Take 650 mg by mouth every 8 (eight) hours as needed for pain.   apixaban (ELIQUIS) 5 MG TABS tablet Take 1 tablet (5 mg total) by mouth 2 (two) times daily.   flecainide (TAMBOCOR) 50 MG tablet Take 1 tablet (50 mg total) by mouth 2 (two) times daily.   glipiZIDE (GLUCOTROL) 10 MG tablet Take 10 mg by mouth 2 (two) times daily.   JARDIANCE 25 MG TABS tablet Take 25 mg by mouth daily.   metFORMIN (GLUCOPHAGE) 1000 MG tablet Take 1 tablet (1,000 mg total) by mouth 2 (two) times daily with a meal.   rosuvastatin (CRESTOR) 10 MG tablet Take 1 tablet (10 mg total) by mouth daily.   [DISCONTINUED] lisinopril (ZESTRIL) 2.5 MG tablet Take 1 tablet (2.5 mg total) by mouth daily. Please make overdue appt with Dr. Tamala Schwartz before anymore refills. Thank you 1st attempt   [DISCONTINUED] metoprolol succinate (TOPROL-XL) 25 MG 24 hr tablet TAKE 1 TABLET  BY MOUTH TWICE DAILY .  MAY  TAKE  AN  EXTRA  TABLET  AS  NEEDED  FOR  BREAKTHROUGH     Allergies:   Patient has no known allergies.   Social History   Socioeconomic History   Marital status: Married    Spouse name: Not on file   Number of children: Not on file   Years of education: Not on file   Highest education level: Not on file  Occupational History   Not on file  Tobacco Use   Smoking status: Former   Smokeless tobacco: Never  Vaping Use   Vaping Use: Never used  Substance and Sexual  Activity   Alcohol use: Yes    Alcohol/week: 4.0 standard drinks    Types: 2 Glasses of wine, 2 Cans of beer per week    Comment: occ   Drug use: No   Sexual activity: Yes  Other Topics Concern   Not on file  Social History Narrative   Lives in [place]   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   Social Determinants of Health   Financial Resource Strain: Not on file  Food Insecurity: Not on file  Transportation Needs: Not on file  Physical Activity: Not on file  Stress: Not on file  Social Connections: Not on file     Family History:  The patient's  family history includes Diabetes in his mother.   ROS:   Please see the history of present illness.    ROS All other systems reviewed and are negative.   PHYSICAL EXAM:   VS:  BP 128/78    Pulse 84    Ht 5\' 8"  (1.727 m)    Wt 293 lb (132.9 kg)    SpO2 97%    BMI 44.55 kg/m   Physical Exam  GEN: Obese, in no acute distress  Neck: no JVD, carotid bruits, or masses Cardiac:distant HS RRR; no murmurs, rubs, or gallops  Respiratory:  clear to auscultation bilaterally, normal work of breathing GI: soft, nontender, nondistended, + BS Ext: without cyanosis, clubbing, or edema, Good distal pulses bilaterally Neuro:  Alert and Oriented x 3 Psych: euthymic mood, full affect  Wt Readings from Last 3 Encounters:  02/09/21 293 lb (132.9 kg)  12/30/20 291 lb (132 kg)  04/01/20 272 lb 6.4 oz (123.6 kg)      Studies/Labs Reviewed:   EKG:  EKG is not ordered today.     Recent Labs: 03/08/2020: BUN 13; Creatinine, Ser 0.96; Hemoglobin 13.8; Platelets 108; Potassium 5.2; Sodium 130   Lipid Panel    Component Value Date/Time   CHOL 182 08/25/2012 0818   TRIG 193 (H) 08/25/2012 0818   HDL 48 08/25/2012 0818   CHOLHDL 3.8 08/25/2012 0818   VLDL 39 08/25/2012 0818   [place] 95 08/25/2012 0818    Additional studies/ records that were reviewed today include:   Echo 11/13/17 demonstrated  Left ventricle: The cavity  size was mildly dilated. Systolic    function was normal. The estimated ejection fraction was in the    range of 60% to 65%. Wall motion was normal; there were no    regional wall motion abnormalities. Left ventricular diastolic    function parameters were normal.  - Aortic valve: Transvalvular velocity was within the normal range.    There was no stenosis. There was no regurgitation.  - Mitral valve: Transvalvular velocity was within the normal range.    There was no evidence for stenosis.  There was trivial    regurgitation. Valve area by pressure half-time: 1.95 cm^2. Valve    area by continuity equation (using LVOT flow): 2.02 cm^2.  - Left atrium: The atrium was severely dilated.  - Right ventricle: The cavity size was normal. Wall thickness was    normal. Systolic function was normal.  - Atrial septum: No defect or patent foramen ovale was identified    by color flow Doppler.  - Tricuspid valve: There was no regurgitation.    GXT 06/25/19 Blood pressure demonstrated a normal response to exercise. There was no ST segment deviation noted during stress. No T wave inversion was noted during stress.   1. Non-diagnostic ECG treadmill stress test for ischemia (only achieved 75% MPHR). 2. No ischemic changes observed but cannot exclude at higher heart rate.  3. Average exercise capacity (4:29 min:s; 7 METS). 4. Normal HR/BP response to exercise.     Risk Assessment/Calculations:    CHA2DS2-VASc Score = 2   This indicates a 2.2% annual risk of stroke. The patient's score is based upon: CHF History: 0 HTN History: 1 Diabetes History: 1 Stroke History: 0 Vascular Disease History: 0 Age Score: 0 Gender Score: 0        ASSESSMENT:    1. Persistent atrial fibrillation ([place])   2. Essential hypertension   3. Sleep apnea, unspecified type   4. Type 2 diabetes mellitus without complication, without long-term current use of insulin ([place])   5. Dyslipidemia   6. Morbid obesity  ([place])      PLAN:  In order of problems listed above:  Persistent Afib on Flecainide-increased briefly last month and back in NSR. On Eliquis-no bleeding problems, labs 11/2020 reviewed Crt 0.7, Hbg normal  HTN BP controlled  OSA-mild doesn't require CPAP  DM2-A1C 12.4 in Nov now on Rybelsus per primary care.  HLD-not taking rosuvastatin and LDL 141, trig 221 11/2020 LDL goal< 55. Restart rosuvastatin 10 mg daily recheck FLP in 3 months  Obesity-150 min exercise and weight loss discussed.  Shared Decision Making/Informed Consent        Medication Adjustments/Labs and Tests Ordered: Current medicines are reviewed at length with the patient today.  Concerns regarding medicines are outlined above.  Medication changes, Labs and Tests ordered today are listed in the Patient Instructions below. Patient Instructions  Medication Instructions: Start Rosuvastatin (Crestor) 10 mg daily   *If you need a refill on your cardiac medications before your next appointment, please call your pharmacy*   Lab Work: Your physician recommends that you return for a FASTING lipid profile on Monday April 17, Lab is open from 7:15 am to 4:30 pm  If you have labs (blood work) drawn today and your tests are completely normal, you will receive your results only by: [place] (if you have MyChart) OR A paper copy in the mail If you have any lab test that is abnormal or we need to change your treatment, we will call you to review the results.   Testing/Procedures: None ordered    Follow-Up: At [facility], you and your health needs are our priority.  As part of our continuing mission to provide you with exceptional heart care, we have created designated Provider Care Teams.  These Care Teams include your primary Cardiologist (physician) and Advanced Practice Providers (APPs -  Physician Assistants and Nurse Practitioners) who all work together to provide you with the care you need, when you need  it.  We recommend signing up for the patient  portal called "MyChart".  Sign up information is provided on this After Visit Summary.  MyChart is used to connect with patients for Virtual Visits (Telemedicine).  Patients are able to view lab/test results, encounter notes, upcoming appointments, etc.  Non-urgent messages can be sent to your provider as well.   To learn more about what you can do with MyChart, go to [email or web address].    Your next appointment:   6 month(s)  The format for your next appointment:   In Person  Provider:   Sinclair Grooms, MD     Other Instructions None    Signed, Ermalinda Barrios, PA-C  02/09/2021 1:06 PM    [address], [place], [place]  [address] Phone: [phone]; Fax: [phone]

## 2021-02-09 NOTE — Addendum Note (Signed)
Addended by: Mellody Dance B on: 02/09/2021 02:00 PM   Modules accepted: Orders

## 2021-05-10 ENCOUNTER — Other Ambulatory Visit: Payer: 59

## 2021-05-10 DIAGNOSIS — E785 Hyperlipidemia, unspecified: Secondary | ICD-10-CM

## 2021-05-10 DIAGNOSIS — I480 Paroxysmal atrial fibrillation: Secondary | ICD-10-CM

## 2021-05-10 LAB — LIPID PANEL
Chol/HDL Ratio: 3.2 ratio (ref 0.0–5.0)
Cholesterol, Total: 164 mg/dL (ref 100–199)
HDL: 51 mg/dL (ref >39)
LDL Chol Calc (NIH): 70 mg/dL (ref 0–99)
Triglycerides: 265 mg/dL — ABNORMAL HIGH (ref 0–149)
VLDL Cholesterol Cal: 43 mg/dL — ABNORMAL HIGH (ref 5–40)

## 2021-05-10 LAB — CBC
Hematocrit: 41.7 % (ref 37.5–51.0)
Hemoglobin: 14.1 g/dL (ref 13.0–17.7)
MCH: 29.9 pg (ref 26.6–33.0)
MCHC: 33.8 g/dL (ref 31.5–35.7)
MCV: 88 fL (ref 79–97)
Platelets: 138 10*3/uL — ABNORMAL LOW (ref 150–450)
RBC: 4.72 x10E6/uL (ref 4.14–5.80)
RDW: 12.3 % (ref 11.6–15.4)
WBC: 6.5 10*3/uL (ref 3.4–10.8)

## 2021-05-10 LAB — BASIC METABOLIC PANEL
BUN/Creatinine Ratio: 22 — ABNORMAL HIGH (ref 9–20)
BUN: 17 mg/dL (ref 6–24)
CO2: 21 mmol/L (ref 20–29)
Calcium: 10.3 mg/dL — ABNORMAL HIGH (ref 8.7–10.2)
Chloride: 99 mmol/L (ref 96–106)
Creatinine, Ser: 0.76 mg/dL (ref 0.76–1.27)
Glucose: 233 mg/dL — ABNORMAL HIGH (ref 70–99)
Potassium: 4.8 mmol/L (ref 3.5–5.2)
Sodium: 137 mmol/L (ref 134–144)
eGFR: 107 mL/min/{1.73_m2} (ref >59)

## 2021-05-11 ENCOUNTER — Other Ambulatory Visit: Payer: Self-pay

## 2021-05-11 DIAGNOSIS — E785 Hyperlipidemia, unspecified: Secondary | ICD-10-CM

## 2021-05-11 MED ORDER — ICOSAPENT ETHYL 1 G PO CAPS: 1.0000 g | ORAL_CAPSULE | Freq: Two times a day (BID) | ORAL | 3 refills | Status: DC

## 2021-05-20 ENCOUNTER — Telehealth: Payer: Self-pay

## 2021-05-20 DIAGNOSIS — Z79899 Other long term (current) drug therapy: Secondary | ICD-10-CM

## 2021-05-20 DIAGNOSIS — E785 Hyperlipidemia, unspecified: Secondary | ICD-10-CM

## 2021-05-20 NOTE — Telephone Encounter (Signed)
**Note De-Identified Shlome Baldree Obfuscation** Icosapent PA started through covermymeds. ?Key: [id number] ?

## 2021-05-24 NOTE — Telephone Encounter (Deleted)
**Note De-Identified Ailana Cuadrado Obfuscation** Addend ?Delete ?Tag ?Copy ? ?Elye Harmsen, Deliah [place], LPN ?Licensed Practical Nurse ?Cardiology ?Telephone Encounter ?Signed ?Creation Time:  05/20/2021  9:43 AM ?  ?Signed    ?  ?Icosapent PA started through covermymeds. ?Key: [id number]  ?  ?  ?  ? ?

## 2021-05-24 NOTE — Telephone Encounter (Deleted)
**Note De-Identified Oron Westrup Obfuscation** [employer] denied the pts Icosapent as follows: ? ?The request for coverage for ICOSAPENT CAP 1GM, use as directed (120 per month), is denied. This ?decision is based on health plan criteria for ICOSAPENT CAP 1GM. This medicine is covered only if: ?Your doctor submits medical records (for example, chart notes, laboratory values) documenting one of ?the following: ?(A) You have been receiving at least 12 consecutive weeks of high-intensity statin therapy (that is, ?atorvastatin 40-80mg , rosuvastatin 20-40mg ) and will continue to receive a high-intensity statin at ?maximally tolerated dose. ?(B) Both of the following: ?(I) You are unable to tolerate high-intensity statins as evidenced by one of the following intolerable ?and persistent (that is, more than two weeks) symptoms: ?(a) Myalgia (muscle symptoms without CK elevations). ?(b) Myositis (muscle symptoms with CK elevations less than ten times upper limit of normal). ?(II) One of the following: ?[id number] ?All [employer]? trademarks and logos are owned by [employer]. All other brand or product names are trademarks or registered marks of their respective ?owners. ?? 2022 [employer] All rights reserved. [phone] Page 2 of 8 ?  ?(a) You have been receiving at least 12 consecutive weeks of moderate-intensity (that is, ?atorvastatin 10-20mg , rosuvastatin 5-10mg , simvastatin greater than or equal to 20mg , pravastatin ?greater than or equal to 40mg , lovastatin 40mg , fluvastatin XL 80mg , fluvastatin 40mg  twice daily or ?Livalo greater than or equal to 2mg ) statin therapy and will continue to receive a moderate-intensity ?statin at a maximally tolerated dose. ?(b) You have been receiving at least 12 consecutive weeks of low-intensity (that is, simvastatin ?10mg , pravastatin 10-20mg , lovastatin 20mg , fluvastatin 20-40mg , or Livalo 1mg ) statin therapy and will ?continue to receive a low-intensity statin at a maximally tolerated dose. ?The information provided does not  show that you meet the criteria listed above. ?Reviewed by: , R.Ph ?

## 2021-05-24 NOTE — Telephone Encounter (Signed)
**Note De-Identified Janeth Terry Obfuscation** [employer] has denied coverage of Icosapent as follows: ? ?The request for coverage for ICOSAPENT CAP 1GM, use as directed (120 per month), is denied. This ?decision is based on health plan criteria for ICOSAPENT CAP 1GM. This medicine is covered only if: ?Your doctor submits medical records (for example, chart notes, laboratory values) documenting one of ?the following: ?You have been receiving at least 12 consecutive weeks of high-intensity statin therapy (that is, ?atorvastatin 40-80mg , rosuvastatin 20-40mg ) and will continue to receive a high-intensity statin at ?maximally tolerated dose. ? ?Per Ermalinda Barrios, PA-c office visit notes from 02/09/2021: ?HLD-not taking rosuvastatin and LDL 141, trig 221 11/2020 LDL goal< 55. Restart rosuvastatin 10 mg  ?daily recheck FLP in 3 months ? ?I called [employer] to get a better understanding of the denial reason and was advised by Jon Gills that the pt has not been on a high intensity statin for 12 consecutive weeks as Rosuvastatin 10 mg is not considered to be a high intensity statin. ? ?High intensity statins are: atorvastatin 40-80mg , rosuvastatin 20-40mg  ? ?Forwarding this message to Dr Tamala Julian and his nurse for advisement to the pt. ?

## 2021-05-25 MED ORDER — ROSUVASTATIN CALCIUM 20 MG PO TABS: 20.0000 mg | ORAL_TABLET | Freq: Every day | ORAL | 3 refills | Status: DC

## 2021-05-25 NOTE — Telephone Encounter (Signed)
Spoke with patient regarding [employer] denial of Vescepa d/t statin dosage. ? ?Per Dr. Katrinka Blazing: ?Agree that the patient needs to be on high intensity statin therapy.  Agree the patient should be on high intensity statin therapy.  Rosuvastatin 20 mg/day should be the dose, liver and lipid panel 8 weeks later.  Further optimization will be dependent upon result.  Let the patient know that cholesterol therapy will be a lifelong requirement.  ? ?Patient reports he has been unable to fill his Rosuvastatin due to it being on backorder at the pharmacy. ? ?I called [facility] pharmacy to verify that they have Rosuvastatin 20mg  tablets in stock. ? ?Rosuvastatin 20mg  QD ordered and sent to [facility] on [address]. Lipid and CMET ordered with lab appt scheduled for 07/21/21. ? ?Patient verbalized understanding of the above. ?

## 2021-07-14 ENCOUNTER — Telehealth: Payer: Self-pay | Admitting: Interventional Cardiology

## 2021-07-14 NOTE — Telephone Encounter (Signed)
*  STAT* If patient is at the pharmacy, call can be transferred to refill team.   1. Which medications need to be refilled? (please list name of each medication and dose if known)  metoprolol succinate (TOPROL-XL) 25 MG 24 hr tablet flecainide (TAMBOCOR) 50 MG tablet  2. Which pharmacy/location (including street and city if local pharmacy) is medication to be sent to? [id number] - [place] ([place]), [place] - [address]  3. Do they need a 30 day or 90 day supply?  90 day supply

## 2021-07-15 ENCOUNTER — Other Ambulatory Visit: Payer: Self-pay

## 2021-07-15 MED ORDER — FLECAINIDE ACETATE 50 MG PO TABS: 50.0000 mg | ORAL_TABLET | Freq: Two times a day (BID) | ORAL | 0 refills | Status: DC

## 2021-07-15 MED ORDER — METOPROLOL SUCCINATE ER 25 MG PO TB24: ORAL_TABLET | ORAL | 0 refills | Status: DC

## 2021-07-21 ENCOUNTER — Other Ambulatory Visit: Payer: 59

## 2021-08-10 ENCOUNTER — Telehealth: Payer: Self-pay | Admitting: Interventional Cardiology

## 2021-08-10 ENCOUNTER — Other Ambulatory Visit: Payer: 59

## 2021-08-10 DIAGNOSIS — E785 Hyperlipidemia, unspecified: Secondary | ICD-10-CM

## 2021-08-10 MED ORDER — FLECAINIDE ACETATE 50 MG PO TABS: 50.0000 mg | ORAL_TABLET | Freq: Two times a day (BID) | ORAL | 1 refills | Status: DC

## 2021-08-10 MED ORDER — METOPROLOL SUCCINATE ER 25 MG PO TB24: ORAL_TABLET | ORAL | 1 refills | Status: DC

## 2021-08-10 NOTE — Telephone Encounter (Signed)
 *  STAT* If patient is at the pharmacy, call can be transferred to refill team.   1. Which medications need to be refilled? (please list name of each medication and dose if known) flecainide (TAMBOCOR) 50 MG tablet metoprolol succinate (TOPROL-XL) 25 MG 24 hr tablet  2. Which pharmacy/location (including street and city if local pharmacy) is medication to be sent to? [id number] - [place] ([place]), [place] - [address]  3. Do they need a 30 day or 90 day supply? 90 days

## 2021-08-10 NOTE — Telephone Encounter (Signed)
Pt's medications were sent to pt's pharmacy as requested. Confirmation received.  

## 2021-08-11 LAB — LIPID PANEL
Chol/HDL Ratio: 2.4 ratio (ref 0.0–5.0)
Cholesterol, Total: 128 mg/dL (ref 100–199)
HDL: 54 mg/dL (ref >39)
LDL Chol Calc (NIH): 54 mg/dL (ref 0–99)
Triglycerides: 112 mg/dL (ref 0–149)
VLDL Cholesterol Cal: 20 mg/dL (ref 5–40)

## 2021-09-28 NOTE — Progress Notes (Unsigned)
Cardiology Office Note:    Date:  09/29/2021   ID:  Juan Schwartz, DOB October 13, 1965, MRN [id number]  PCP:  Patient, No Pcp Per  Cardiologist:  Lesleigh Noe, MD   Referring MD: No ref. provider found   Chief Complaint  Patient presents with   Congestive Heart Failure   Atrial Fibrillation   Hypertension    History of Present Illness:    Juan Schwartz is a 56 y.o. male with a hx of obesity, hypertension, ETOH use, hyperlipidemia, DM II, paroxysmal atrial fibrillation, electrical cardioversion in July 2017 and 03/25/2019, Flecainide therapy, anticoagulation started that time, mild reduction in LV function with mildly depressed LVEF in the 45%, chronic anticoagulation (CHADS VASC 3),  [name] is doing well.  He has had perhaps 1 episode of atrial fibrillation since last being seen.  That particular episode occurred while working on the hot environmental conditions.  He took an extra flecainide, sat and rested, and eventually reverted to normal rhythm.  He has required electrical cardioversion in the past with most recent being in 2021.  He does not have sleep apnea.  He is not having bleeding on  Past Medical History:  Diagnosis Date   Allergy    Atrial fibrillation (HCC) 08/04/2015   CHA2DS2-VASc = 3   Dehydration 08/04/2015   Diabetes mellitus, type 2 (HCC) 11/29/2011   Essential hypertension 08/04/2015   Hyperlipidemia    Hypertension    Mild sleep apnea 2017   NICM (nonischemic cardiomyopathy) (HCC)    Normal coronary arteries 2004   Obesity 11/29/2011    Past Surgical History:  Procedure Laterality Date   CARDIAC CATHETERIZATION  2005   CARDIOVERSION N/A 08/05/2015   Procedure: CARDIOVERSION;  Surgeon: Jake Bathe, MD;  Location: [facility] ENDOSCOPY;  Service: Cardiovascular;  Laterality: N/A;   CARDIOVERSION N/A 07/19/2017   Procedure: CARDIOVERSION;  Surgeon: Vesta Mixer, MD;  Location: [facility] ENDOSCOPY;  Service: Cardiovascular;  Laterality: N/A;    CARDIOVERSION N/A 03/25/2019   Procedure: CARDIOVERSION;  Surgeon: Jake Bathe, MD;  Location: [facility] ENDOSCOPY;  Service: Cardiovascular;  Laterality: N/A;   HERNIA REPAIR     TEE WITHOUT CARDIOVERSION N/A 08/05/2015   Procedure: TRANSESOPHAGEAL ECHOCARDIOGRAM (TEE);  Surgeon: Jake Bathe, MD;  Location: [facility] ENDOSCOPY;  Service: Cardiovascular;  Laterality: N/A;    Current Medications: Current Meds  Medication Sig   acetaminophen (TYLENOL) 650 MG CR tablet Take 650 mg by mouth every 8 (eight) hours as needed for pain.   apixaban (ELIQUIS) 5 MG TABS tablet Take 1 tablet (5 mg total) by mouth 2 (two) times daily.   flecainide (TAMBOCOR) 50 MG tablet Take 1 tablet (50 mg total) by mouth 2 (two) times daily.   glipiZIDE (GLUCOTROL) 10 MG tablet Take 10 mg by mouth 2 (two) times daily.   icosapent Ethyl (VASCEPA) 1 g capsule Take 1 capsule (1 g total) by mouth 2 (two) times daily.   JARDIANCE 25 MG TABS tablet Take 25 mg by mouth daily.   lisinopril (ZESTRIL) 2.5 MG tablet Take 1 tablet (2.5 mg total) by mouth daily.   meloxicam (MOBIC) 15 MG tablet Take by mouth.   metFORMIN (GLUCOPHAGE) 1000 MG tablet Take 1 tablet (1,000 mg total) by mouth 2 (two) times daily with a meal.   metoprolol succinate (TOPROL-XL) 25 MG 24 hr tablet TAKE 1 TABLET BY MOUTH TWICE DAILY .  MAY  TAKE  AN  EXTRA  TABLET  AS  NEEDED  FOR  BREAKTHROUGH.  rosuvastatin (CRESTOR) 20 MG tablet Take 1 tablet (20 mg total) by mouth daily.   Semaglutide (RYBELSUS) 3 MG TABS Take 1 tablet by mouth as directed.     Allergies:   Patient has no known allergies.   Social History   Socioeconomic History   Marital status: Married    Spouse name: Not on file   Number of children: Not on file   Years of education: Not on file   Highest education level: Not on file  Occupational History   Not on file  Tobacco Use   Smoking status: Former   Smokeless tobacco: Never  Vaping Use   Vaping Use: Never used  Substance and Sexual  Activity   Alcohol use: Yes    Alcohol/week: 4.0 standard drinks of alcohol    Types: 2 Glasses of wine, 2 Cans of beer per week    Comment: occ   Drug use: No   Sexual activity: Yes  Other Topics Concern   Not on file  Social History Narrative   Lives in [place]   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   Social Determinants of Health   Financial Resource Strain: Not on file  Food Insecurity: Not on file  Transportation Needs: Not on file  Physical Activity: Not on file  Stress: Not on file  Social Connections: Not on file     Family History: The patient's family history includes Diabetes in his mother.  ROS:   Please see the history of present illness.    He sleeps well.  Losing weight.  On semaglutide/Rybelsus.  Tolerating the medications without difficulty.  Denies orthopnea.  No edema.  All other systems reviewed and are negative.  EKGs/Labs/Other Studies Reviewed:    The following studies were reviewed today: Needs reassessment of LV function.  EKG:  EKG normal sinus rhythm, normal EKG appearance.  Vertical axis.  Overall normal and in comparison to December 2022, the QRS axis is more leftward.  Recent Labs: 05/10/2021: BUN 17; Creatinine, Ser 0.76; Hemoglobin 14.1; Platelets 138; Potassium 4.8; Sodium 137  Recent Lipid Panel    Component Value Date/Time   CHOL 128 08/10/2021 0731   TRIG 112 08/10/2021 0731   HDL 54 08/10/2021 0731   CHOLHDL 2.4 08/10/2021 0731   CHOLHDL 3.8 08/25/2012 0818   VLDL 39 08/25/2012 0818   LDLCALC 54 08/10/2021 0731    Physical Exam:    VS:  BP 128/78   Pulse 71   Ht 5\' 8"  (1.727 m)   Wt 287 lb (130.2 kg)   SpO2 96%   BMI 43.64 kg/m     Wt Readings from Last 3 Encounters:  09/29/21 287 lb (130.2 kg)  02/09/21 293 lb (132.9 kg)  12/30/20 291 lb (132 kg)     GEN: Morbid with BMI 43.64. No acute distress HEENT: Normal NECK: No JVD. LYMPHATICS: No lymphadenopathy CARDIAC: No murmur. RRR no gallop, or  edema. VASCULAR:  Normal Pulses. No bruits. RESPIRATORY:  Clear to auscultation without rales, wheezing or rhonchi  ABDOMEN: Soft, non-tender, non-distended, No pulsatile mass, MUSCULOSKELETAL: No deformity  SKIN: Warm and dry NEUROLOGIC:  Alert and oriented x 3 PSYCHIATRIC:  Normal affect   ASSESSMENT:    1. Paroxysmal atrial fibrillation (HCC)   2. Essential hypertension   3. Chronic combined systolic and diastolic heart failure (HCC)   4. Type 2 diabetes mellitus without complication, without long-term current use of insulin (HCC)   5. Morbid obesity (HCC)   6. Chronic  anticoagulation    PLAN:    In order of problems listed above:  This is recently been very stable since the initiation of flecainide therapy.  He will continue on anticoagulation because of elevated CHA2DS2-VASc. Excellent blood pressure control on current regimen.  We will reassess LV function and may have to adjust medications if EF is still low. 2D Doppler echocardiogram.  May have to start Entresto if still low. Continue Rybelsus and empagliflozin. Losing weight on current therapy. Continue Eliquis.  Overall education and awareness concerning primary/secondary risk prevention was discussed in detail: LDL less than 70, hemoglobin A1c less than 7, blood pressure target less than 130/80 mmHg, >150 minutes of moderate aerobic activity per week, avoidance of smoking, weight control (via diet and exercise), and continued surveillance/management of/for obstructive sleep apnea.   Guideline directed therapy for left ventricular systolic dysfunction: Angiotensin receptor-neprilysin inhibitor (ARNI)-Entresto; beta-blocker therapy - carvedilol, metoprolol succinate, or bisoprolol; mineralocorticoid receptor antagonist (MRA) therapy -spironolactone or eplerenone.  SGLT-2 agents -  Dapagliflozin Marcelline Deist) or Empagliflozin (Jardiance).These therapies have been shown to improve clinical outcomes including reduction of  rehospitalization, survival, and acute heart failure.    Medication Adjustments/Labs and Tests Ordered: Current medicines are reviewed at length with the patient today.  Concerns regarding medicines are outlined above.  No orders of the defined types were placed in this encounter.  No orders of the defined types were placed in this encounter.   Patient Instructions  Medication Instructions:  Your physician recommends that you continue on your current medications as directed. Please refer to the Current Medication list given to you today.  *If you need a refill on your cardiac medications before your next appointment, please call your pharmacy*  Lab Work: NONE  Testing/Procedures: Your physician has requested that you have an echocardiogram. Echocardiography is a painless test that uses sound waves to create images of your heart. It provides your doctor with information about the size and shape of your heart and how well your heart's chambers and valves are working. This procedure takes approximately one hour. There are no restrictions for this procedure.  Follow-Up: At [facility], you and your health needs are our priority.  As part of our continuing mission to provide you with exceptional heart care, we have created designated Provider Care Teams.  These Care Teams include your primary Cardiologist (physician) and Advanced Practice Providers (APPs -  Physician Assistants and Nurse Practitioners) who all work together to provide you with the care you need, when you need it.  Your next appointment:   9-12 month(s)  The format for your next appointment:   In Person  Provider:   Lesleigh Noe, MD    Important Information About Sugar         Signed, Lesleigh Noe, MD  09/29/2021 8:31 AM    [facility]

## 2021-09-29 ENCOUNTER — Encounter: Payer: Self-pay | Admitting: Interventional Cardiology

## 2021-09-29 ENCOUNTER — Ambulatory Visit: Payer: 59 | Attending: Interventional Cardiology | Admitting: Interventional Cardiology

## 2021-09-29 VITALS — BP 128/78 | HR 71 | Ht 68.0 in | Wt 287.0 lb

## 2021-09-29 DIAGNOSIS — I480 Paroxysmal atrial fibrillation: Secondary | ICD-10-CM | POA: Diagnosis not present

## 2021-09-29 DIAGNOSIS — I5042 Chronic combined systolic (congestive) and diastolic (congestive) heart failure: Secondary | ICD-10-CM

## 2021-09-29 DIAGNOSIS — G4730 Sleep apnea, unspecified: Secondary | ICD-10-CM

## 2021-09-29 DIAGNOSIS — E119 Type 2 diabetes mellitus without complications: Secondary | ICD-10-CM

## 2021-09-29 DIAGNOSIS — I4819 Other persistent atrial fibrillation: Secondary | ICD-10-CM

## 2021-09-29 DIAGNOSIS — Z7901 Long term (current) use of anticoagulants: Secondary | ICD-10-CM

## 2021-09-29 DIAGNOSIS — I10 Essential (primary) hypertension: Secondary | ICD-10-CM | POA: Diagnosis not present

## 2021-09-29 NOTE — Patient Instructions (Signed)
Medication Instructions:  Your physician recommends that you continue on your current medications as directed. Please refer to the Current Medication list given to you today.  *If you need a refill on your cardiac medications before your next appointment, please call your pharmacy*  Lab Work: NONE  Testing/Procedures: Your physician has requested that you have an echocardiogram. Echocardiography is a painless test that uses sound waves to create images of your heart. It provides your doctor with information about the size and shape of your heart and how well your heart's chambers and valves are working. This procedure takes approximately one hour. There are no restrictions for this procedure.  Follow-Up: At [facility], you and your health needs are our priority.  As part of our continuing mission to provide you with exceptional heart care, we have created designated Provider Care Teams.  These Care Teams include your primary Cardiologist (physician) and Advanced Practice Providers (APPs -  Physician Assistants and Nurse Practitioners) who all work together to provide you with the care you need, when you need it.  Your next appointment:   9-12 month(s)  The format for your next appointment:   In Person  Provider:   Lesleigh Noe, MD    Important Information About Sugar

## 2021-10-06 ENCOUNTER — Encounter: Payer: Self-pay | Admitting: Interventional Cardiology

## 2021-10-06 ENCOUNTER — Ambulatory Visit (HOSPITAL_COMMUNITY): Payer: 59 | Attending: Interventional Cardiology

## 2021-10-06 DIAGNOSIS — I5042 Chronic combined systolic (congestive) and diastolic (congestive) heart failure: Secondary | ICD-10-CM | POA: Diagnosis present

## 2021-10-06 LAB — ECHOCARDIOGRAM COMPLETE
Area-P 1/2: 4.63 cm2
S' Lateral: 3.55 cm

## 2021-11-10 ENCOUNTER — Other Ambulatory Visit: Payer: Self-pay | Admitting: Nurse Practitioner

## 2021-11-10 ENCOUNTER — Ambulatory Visit
Admission: RE | Admit: 2021-11-10 | Discharge: 2021-11-10 | Disposition: A | Discharge status: Home / Self Care | Payer: No Typology Code available for payment source | Source: Ambulatory Visit | Attending: Nurse Practitioner | Admitting: Nurse Practitioner

## 2021-11-10 DIAGNOSIS — M79641 Pain in right hand: Secondary | ICD-10-CM

## 2021-11-10 DIAGNOSIS — M25561 Pain in right knee: Secondary | ICD-10-CM

## 2021-11-19 ENCOUNTER — Ambulatory Visit: Payer: 59 | Admitting: Cardiovascular Disease

## 2022-02-15 ENCOUNTER — Telehealth: Payer: Self-pay | Admitting: Interventional Cardiology

## 2022-02-15 NOTE — Telephone Encounter (Signed)
Patient is requesting his return to work letters on 10/06/2021 and 09/29/2021 be sent to him via MyChart or email. Requesting return call to discuss.

## 2022-02-15 NOTE — Progress Notes (Signed)
Spoke with the patient and advised on letters that are in his MyChart from Dr. Tamala Julian. Patient thanked me for assistance and will let us know if there is anything else he needs from [place] for his work.

## 2022-02-15 NOTE — Telephone Encounter (Signed)
Spoke with the patient who is looking for letters that were previously given to him for his work from Dr. Tamala Julian. Advised patient on where letters are located on his MyChart. Patient verbalized understanding and was able to locate these.

## 2022-04-04 ENCOUNTER — Other Ambulatory Visit: Payer: Self-pay | Admitting: *Deleted

## 2022-04-04 MED ORDER — LISINOPRIL 2.5 MG PO TABS: 2.5000 mg | ORAL_TABLET | Freq: Every day | ORAL | 0 refills | Status: DC

## 2022-04-04 MED ORDER — METOPROLOL SUCCINATE ER 25 MG PO TB24: ORAL_TABLET | ORAL | 0 refills | Status: DC

## 2022-04-04 NOTE — Telephone Encounter (Signed)
Patient called the office requesting medication refill for Metoprolol and Lisinopril. Refills sent to [facility] on [address]. Patient advised to please keep upcoming new patient appointment on Friday, June 7, 24 @ 8:00 am with Dr. Gasper Sells for future medication refills.

## 2022-04-26 ENCOUNTER — Other Ambulatory Visit: Payer: Self-pay

## 2022-04-26 MED ORDER — ROSUVASTATIN CALCIUM 20 MG PO TABS: 20.0000 mg | ORAL_TABLET | Freq: Every day | ORAL | 0 refills | Status: DC

## 2022-06-29 NOTE — Progress Notes (Signed)
Cardiology Office Note:    Date:  07/01/2022   ID:  Juan Schwartz, DOB Jan 23, 1966, MRN [id number]  PCP:  Patient, No Pcp Per  Cardiologist:  Lesleigh Noe, MD (Inactive)   Referring MD: No ref. provider found   CC: Consulted for the evaluation of transition to new cardiologist   History of Present Illness:    Juan Schwartz is a 57 y.o. male with a hx of obesity, hypertension, ETOH use, hyperlipidemia, DM II, paroxysmal atrial fibrillation, electrical cardioversion in July 2017 and 03/25/2019, Flecainide therapy, anticoagulation started that time, mild reduction in LV function with mildly depressed LVEF in the 45%, chronic anticoagulation (CHADS VASC 3), 2023: Saw Dr. Katrinka Blazing; had one episode of atrial fibrillation.  Normal LV function.  Patient notes that he is doing well.   Here for a one year physical and to re-establish  There are no interval hospital/ED visit.    No chest pain or pressure .  No SOB/DOE and no PND/Orthopnea.  No weight gain or leg swelling.  He has one episodes of sudden onset palpitations while cooking and drinking sweet tea.  Had near syncope that resolved with an extra flecainide and metoprolol.   Past Medical History:  Diagnosis Date   Allergy    Atrial fibrillation (HCC) 08/04/2015   CHA2DS2-VASc = 3   Dehydration 08/04/2015   Diabetes mellitus, type 2 (HCC) 11/29/2011   Essential hypertension 08/04/2015   Hyperlipidemia    Hypertension    Mild sleep apnea 2017   NICM (nonischemic cardiomyopathy) (HCC)    Normal coronary arteries 2004   Obesity 11/29/2011    Past Surgical History:  Procedure Laterality Date   CARDIAC CATHETERIZATION  2005   CARDIOVERSION N/A 08/05/2015   Procedure: CARDIOVERSION;  Surgeon: Jake Bathe, MD;  Location: [facility] ENDOSCOPY;  Service: Cardiovascular;  Laterality: N/A;   CARDIOVERSION N/A 07/19/2017   Procedure: CARDIOVERSION;  Surgeon: Vesta Mixer, MD;  Location: [facility] ENDOSCOPY;  Service: Cardiovascular;   Laterality: N/A;   CARDIOVERSION N/A 03/25/2019   Procedure: CARDIOVERSION;  Surgeon: Jake Bathe, MD;  Location: [facility] ENDOSCOPY;  Service: Cardiovascular;  Laterality: N/A;   HERNIA REPAIR     TEE WITHOUT CARDIOVERSION N/A 08/05/2015   Procedure: TRANSESOPHAGEAL ECHOCARDIOGRAM (TEE);  Surgeon: Jake Bathe, MD;  Location: [facility] ENDOSCOPY;  Service: Cardiovascular;  Laterality: N/A;    Current Medications: Current Meds  Medication Sig   acetaminophen (TYLENOL) 650 MG CR tablet Take 650 mg by mouth every 8 (eight) hours as needed for pain.   apixaban (ELIQUIS) 5 MG TABS tablet Take 1 tablet (5 mg total) by mouth 2 (two) times daily.   flecainide (TAMBOCOR) 50 MG tablet Take 1 tablet (50 mg total) by mouth 2 (two) times daily.   glipiZIDE (GLUCOTROL) 10 MG tablet Take 10 mg by mouth 2 (two) times daily.   JARDIANCE 25 MG TABS tablet Take 25 mg by mouth daily.   lisinopril (ZESTRIL) 2.5 MG tablet TAKE 1 TABLET BY MOUTH ONCE DAILY MUST  KEEP  APPOINTMENT  IN  JULY   meloxicam (MOBIC) 15 MG tablet Take 15 mg by mouth as needed for pain.   metFORMIN (GLUCOPHAGE) 1000 MG tablet Take 1 tablet (1,000 mg total) by mouth 2 (two) times daily with a meal.   metoprolol succinate (TOPROL-XL) 25 MG 24 hr tablet TAKE 1 TABLET BY MOUTH TWICE DAILY .  MAY  TAKE  AN  EXTRA  TABLET  AS  NEEDED  FOR  BREAKTHROUGH.  Please keep upcoming appointment in July 2024 for future refills. Thank you   rosuvastatin (CRESTOR) 20 MG tablet Take 1 tablet (20 mg total) by mouth daily.   sildenafil (VIAGRA) 100 MG tablet Take 1 tablet by mouth as needed.     Allergies:   Patient has no known allergies.   Social History   Socioeconomic History   Marital status: Married    Spouse name: Not on file   Number of children: Not on file   Years of education: Not on file   Highest education level: Not on file  Occupational History   Not on file  Tobacco Use   Smoking status: Former   Smokeless tobacco: Never  Vaping Use    Vaping Use: Never used  Substance and Sexual Activity   Alcohol use: Yes    Alcohol/week: 4.0 standard drinks of alcohol    Types: 2 Glasses of wine, 2 Cans of beer per week    Comment: occ   Drug use: No   Sexual activity: Yes  Other Topics Concern   Not on file  Social History Narrative   Lives in [place]   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   Social Determinants of Health   Financial Resource Strain: Not on file  Food Insecurity: Not on file  Transportation Needs: Not on file  Physical Activity: Not on file  Stress: Not on file  Social Connections: Not on file     Family History: The patient's family history includes Diabetes in his mother.  EKGs/Labs/Other Studies Reviewed:    EKG: 07/01/22: SR rate 76 Biatrlal enlargement, anterior infarct pattern    Cardiac Studies & Procedures     STRESS TESTS  EXERCISE TOLERANCE TEST (ETT) 06/26/2019  Narrative  Blood pressure demonstrated a normal response to exercise.  There was no ST segment deviation noted during stress.  No T wave inversion was noted during stress.  1. Non-diagnostic ECG treadmill stress test for ischemia (only achieved 75% MPHR). 2. No ischemic changes observed but cannot exclude at higher heart rate. 3. Average exercise capacity (4:29 min:s; 7 METS). 4. Normal HR/BP response to exercise.   ECHOCARDIOGRAM  ECHOCARDIOGRAM COMPLETE 10/06/2021  Narrative ECHOCARDIOGRAM REPORT    Patient Name:   Juan Schwartz Date of Exam: 10/06/2021 Medical Rec #:  [id number]           Height:       68.0 in Accession #:    [id number]          Weight:       287.0 lb Date of Birth:  07/09/1965           BSA:          2.383 m Patient Age:    55 years            BP:           128/78 mmHg Patient Gender: M                   HR:           74 bpm. Exam Location:  [address]  Procedure: 2D Echo, Cardiac Doppler and Color Doppler  Indications:    I50.42 Chronic combined systolic  (congestive) and diastolic (congestive) heart failure  History:        Patient has prior history of Echocardiogram examinations, most recent 11/13/2017. Arrythmias:Atrial Fibrillation; Risk Factors:Hypertension, Diabetes, Dyslipidemia and Sleep Apnea. Obesity. NICM.  Sonographer:    Marylene Land  Earl Lites RCS Referring Phys: [phone] Barry Dienes [facility]  IMPRESSIONS   1. Left ventricular ejection fraction, by estimation, is 55 to 60%. The left ventricle has normal function. The left ventricle has no regional wall motion abnormalities. Left ventricular diastolic parameters were normal. 2. Right ventricular systolic function is normal. The right ventricular size is normal. 3. The mitral valve is normal in structure. No evidence of mitral valve regurgitation. No evidence of mitral stenosis. 4. The aortic valve is tricuspid. Aortic valve regurgitation is not visualized. No aortic stenosis is present. 5. The inferior vena cava is normal in size with greater than 50% respiratory variability, suggesting right atrial pressure of 3 mmHg.  Comparison(s): No significant change from prior study. Prior images reviewed side by side. The left ventricular function has improved.  FINDINGS Left Ventricle: Left ventricular ejection fraction, by estimation, is 55 to 60%. The left ventricle has normal function. The left ventricle has no regional wall motion abnormalities. The left ventricular internal cavity size was normal in size. There is no left ventricular hypertrophy. Left ventricular diastolic parameters were normal. Normal left ventricular filling pressure.  Right Ventricle: The right ventricular size is normal. No increase in right ventricular wall thickness. Right ventricular systolic function is normal.  Left Atrium: Left atrial size was normal in size.  Right Atrium: Right atrial size was normal in size.  Pericardium: There is no evidence of pericardial effusion.  Mitral Valve: The mitral valve is normal in  structure. No evidence of mitral valve regurgitation. No evidence of mitral valve stenosis.  Tricuspid Valve: The tricuspid valve is normal in structure. Tricuspid valve regurgitation is not demonstrated. No evidence of tricuspid stenosis.  Aortic Valve: The aortic valve is tricuspid. Aortic valve regurgitation is not visualized. No aortic stenosis is present.  Pulmonic Valve: The pulmonic valve was grossly normal. Pulmonic valve regurgitation is not visualized. No evidence of pulmonic stenosis.  Aorta: The aortic root is normal in size and structure.  Venous: The inferior vena cava is normal in size with greater than 50% respiratory variability, suggesting right atrial pressure of 3 mmHg.  IAS/Shunts: No atrial level shunt detected by color flow Doppler.   LEFT VENTRICLE PLAX 2D LVIDd:         4.80 cm   Diastology LVIDs:         3.55 cm   LV e' medial:    10.90 cm/s LV PW:         1.00 cm   LV E/e' medial:  11.1 LV IVS:        0.90 cm   LV e' lateral:   9.81 cm/s LVOT diam:     2.30 cm   LV E/e' lateral: 12.3 LV SV:         100 LV SV Index:   42 LVOT Area:     4.15 cm   RIGHT VENTRICLE RV Basal diam:  2.90 cm RV S prime:     14.70 cm/s TAPSE (M-mode): 2.9 cm  LEFT ATRIUM             Index        RIGHT ATRIUM           Index LA diam:        3.60 cm 1.51 cm/m   RA Area:     15.30 cm LA Vol (A2C):   53.3 ml 22.37 ml/m  RA Volume:   40.90 ml  17.16 ml/m LA Vol (A4C):   38.7 ml 16.24 ml/m LA  Biplane Vol: 49.3 ml 20.69 ml/m AORTIC VALVE LVOT Vmax:   121.00 cm/s LVOT Vmean:  79.700 cm/s LVOT VTI:    0.240 m  AORTA Ao Root diam: 3.10 cm Ao Asc diam:  3.00 cm  MITRAL VALVE MV Area (PHT): 4.63 cm     SHUNTS MV Decel Time: 164 msec     Systemic VTI:  0.24 m MV E velocity: 121.00 cm/s  Systemic Diam: 2.30 cm MV A velocity: 99.10 cm/s MV E/A ratio:  1.22  Mihai Croitoru MD Electronically signed by Thurmon Fair MD Signature Date/Time: 10/06/2021/3:17:38  PM    Final   TEE  ECHO TEE 08/05/2015  Narrative *[place]* *[facility]* [address] [place], [place] [address] [phone]  ------------------------------------------------------------------- Transesophageal Echocardiography  Patient:    Tab, Mclouth MR #:       [id number] Study Date: 08/05/2015 Gender:     M Age:        49 Height:     175.3 cm Weight:     122.7 kg BSA:        2.5 m^2 Pt. Status: Room:       2H20C  ADMITTING    Lyn Records, MD ATTENDING    Lyn Records, MD Charlestine Night REFERRING    Neotsu, Franky Macho K PERFORMING   Donato Schultz, M.D. SONOGRAPHER  Thurman Coyer  cc:  ------------------------------------------------------------------- LV EF: 40% -   45%  ------------------------------------------------------------------- Indications:      Atrial fibrillation - 427.31.  ------------------------------------------------------------------- History:   PMH:   Congestive heart failure.  Risk factors: Hypertension. Diabetes mellitus. Dyslipidemia.  ------------------------------------------------------------------- Study Conclusions  - Left ventricle: Systolic function was mildly to moderately reduced. The estimated ejection fraction was in the range of 40% to 45%. Diffuse hypokinesis. - Left atrium: No evidence of thrombus in the atrial cavity or appendage. No evidence of thrombus in the appendage. - Right atrium: No evidence of thrombus in the atrial cavity or appendage. - Superior vena cava: The study excluded a thrombus.  ------------------------------------------------------------------- Study data:   Study status:  Routine.  Consent:  The risks, benefits, and alternatives to the procedure were explained to the patient and informed consent was obtained.  Procedure:  The patient reported no pain pre or post test. Initial setup. The patient was brought to the laboratory. Surface ECG leads were  monitored. Sedation. Conscious sedation was administered by anesthesiology staff. Transesophageal echocardiography. Topical anesthesia was obtained using viscous lidocaine. A transesophageal probe was inserted by the attending cardiologist. Image quality was adequate. Study completion:  The patient tolerated the procedure well. There were no complications.          Diagnostic transesophageal echocardiography.  2D and color Doppler.  Birthdate:  Patient birthdate: 1965-10-15.  Age:  Patient is 57 yr old.  Sex:  Gender: male.    BMI: 40 kg/m^2.  Blood pressure:     101/52  Patient status:  Inpatient.  Study date:  Study date: 08/05/2015. Study time: 11:09 AM.  Location:  Endoscopy.  -------------------------------------------------------------------  ------------------------------------------------------------------- Left ventricle:  Systolic function was mildly to moderately reduced. The estimated ejection fraction was in the range of 40% to 45%. Diffuse hypokinesis.  ------------------------------------------------------------------- Aortic valve:   Structurally normal valve.   Cusp separation was normal.  No evidence of vegetation.  Doppler:  There was no regurgitation.  ------------------------------------------------------------------- Aorta:  The aorta was normal, not dilated, and non-diseased.  ------------------------------------------------------------------- Mitral valve:   Structurally  normal valve.   Leaflet separation was normal.  No evidence of vegetation.  Doppler:  There was no regurgitation.  ------------------------------------------------------------------- Left atrium:  The atrium was normal in size.  No evidence of thrombus in the atrial cavity or appendage.  No evidence of thrombus in the appendage. The appendage was of normal size. Emptying velocity was normal.  ------------------------------------------------------------------- Right ventricle:  The  cavity size was normal. Wall thickness was normal. Systolic function was normal.  ------------------------------------------------------------------- Pulmonic valve:    Structurally normal valve.   Cusp separation was normal.  No evidence of vegetation.  ------------------------------------------------------------------- Tricuspid valve:   Structurally normal valve.   Leaflet separation was normal.  No evidence of vegetation.  Doppler:  There was trivial regurgitation.  ------------------------------------------------------------------- Right atrium:  The atrium was normal in size.  No evidence of thrombus in the atrial cavity or appendage.  ------------------------------------------------------------------- Pericardium:  The pericardium was normal in appearance. There was no pericardial effusion.  ------------------------------------------------------------------- Systemic veins: Superior vena cava: The study excluded a thrombus.  ------------------------------------------------------------------- Post procedure conclusions Ascending Aorta:  - The aorta was normal, not dilated, and non-diseased.  ------------------------------------------------------------------- Prepared and Electronically Authenticated by  Donato Schultz, M.D. 2017-07-12T12:17:22   MONITORS  CARDIAC EVENT MONITOR 01/20/2018  Narrative Sinus rhythm Episodes of atrial fibrillation and atrial flutter are noted,  V rates are frequently elevated           Recent Labs: No results found for requested labs within last 365 days.  Recent Lipid Panel    Component Value Date/Time   CHOL 128 08/10/2021 0731   TRIG 112 08/10/2021 0731   HDL 54 08/10/2021 0731   CHOLHDL 2.4 08/10/2021 0731   CHOLHDL 3.8 08/25/2012 0818   VLDL 39 08/25/2012 0818   LDLCALC 54 08/10/2021 0731    Physical Exam:    VS:  BP 110/70   Pulse 77   Ht 5\' 8"  (1.727 m)   Wt 278 lb (126.1 kg)   SpO2 96%   BMI 42.27 kg/m      Wt Readings from Last 3 Encounters:  07/01/22 278 lb (126.1 kg)  09/29/21 287 lb (130.2 kg)  02/09/21 293 lb (132.9 kg)    GEN: Morbid obesity No acute distress CARDIAC: No murmur. RRR VASCULAR:  Normal Pulses RESPIRATORY:  Clear to auscultation without rales, wheezing or rhonchi  ABDOMEN: Soft, non-tender, non-distended MUSCULOSKELETAL: No deformity  SKIN: Warm and dry, non pitting edema R LE NEUROLOGIC:  Alert and oriented x 3 PSYCHIATRIC:  Normal affect   ASSESSMENT:    1. Paroxysmal atrial fibrillation (HCC)   2. Morbid obesity (HCC)   3. Alcohol abuse   4. Diabetes mellitus with coincident hypertension (HCC)   5. Mixed hyperlipidemia   6. Chronic heart failure, unspecified heart failure type (HCC)     PLAN:    Paroxysmal Atrial Fibrillation - Risk factors include HFrEF (recovered), HTN, and DM - CHADSVASC=2. - Continue anticoagulation with Eliquis; Acquired Thrombophilia - with no evidence of mod/severe MS, rheumatic disease, or mechanical valve - discussed tobacco (former), alcohol (current); limited utility in caffeine abstinence in most patients (discussed sweet tea) - discussed weight loss of 10% (BMI is greater than 40) and exercise (210 minutes of exercise) as preventive factors  - OSA testing has been negative X3  - Continue rate control with metoprolol - Rhythm control options doing well with 1C agent  HF recovered EF with rhythm control strategy HTN and DM - continue ACEi, SGTL2i and BB (see above)  Morbid  Obesity  Alcohol abuse - two beers daily - lost significant weight loss even without GLP-1 therapy  - if he stops drinking, given his DM, we would discuss GLP-1  HLD - discussed diet and supplements - continue statin  One year me or Tessa   Medication Adjustments/Labs and Tests Ordered: Current medicines are reviewed at length with the patient today.  Concerns regarding medicines are outlined above.  Orders Placed This Encounter   Procedures   EKG 12-Lead   No orders of the defined types were placed in this encounter.   Patient Instructions  Medication Instructions:  Your physician recommends that you continue on your current medications as directed. Please refer to the Current Medication list given to you today.  *If you need a refill on your cardiac medications before your next appointment, please call your pharmacy*   Lab Work: NONE If you have labs (blood work) drawn today and your tests are completely normal, you will receive your results only by: MyChart Message (if you have MyChart) OR A paper copy in the mail If you have any lab test that is abnormal or we need to change your treatment, we will call you to review the results.   Testing/Procedures: NONE   Follow-Up: At [facility], you and your health needs are our priority.  As part of our continuing mission to provide you with exceptional heart care, we have created designated Provider Care Teams.  These Care Teams include your primary Cardiologist (physician) and Advanced Practice Providers (APPs -  Physician Assistants and Nurse Practitioners) who all work together to provide you with the care you need, when you need it.   Your next appointment:   1 year(s)  Provider:   Riley Lam, MD or Jari Favre, PA       Signed, Christell Constant, MD  07/01/2022 8:34 AM    [facility]

## 2022-07-01 ENCOUNTER — Ambulatory Visit: Payer: 59 | Attending: Internal Medicine | Admitting: Internal Medicine

## 2022-07-01 ENCOUNTER — Other Ambulatory Visit: Payer: Self-pay | Admitting: Internal Medicine

## 2022-07-01 ENCOUNTER — Encounter: Payer: Self-pay | Admitting: Internal Medicine

## 2022-07-01 VITALS — BP 110/70 | HR 77 | Ht 68.0 in | Wt 278.0 lb

## 2022-07-01 DIAGNOSIS — E119 Type 2 diabetes mellitus without complications: Secondary | ICD-10-CM | POA: Diagnosis not present

## 2022-07-01 DIAGNOSIS — Z7984 Long term (current) use of oral hypoglycemic drugs: Secondary | ICD-10-CM

## 2022-07-01 DIAGNOSIS — I10 Essential (primary) hypertension: Secondary | ICD-10-CM

## 2022-07-01 DIAGNOSIS — F1010 Alcohol abuse, uncomplicated: Secondary | ICD-10-CM

## 2022-07-01 DIAGNOSIS — I509 Heart failure, unspecified: Secondary | ICD-10-CM

## 2022-07-01 DIAGNOSIS — I480 Paroxysmal atrial fibrillation: Secondary | ICD-10-CM

## 2022-07-01 DIAGNOSIS — E782 Mixed hyperlipidemia: Secondary | ICD-10-CM | POA: Insufficient documentation

## 2022-07-01 NOTE — Patient Instructions (Signed)
Medication Instructions:  Your physician recommends that you continue on your current medications as directed. Please refer to the Current Medication list given to you today.  *If you need a refill on your cardiac medications before your next appointment, please call your pharmacy*   Lab Work: NONE If you have labs (blood work) drawn today and your tests are completely normal, you will receive your results only by: MyChart Message (if you have MyChart) OR A paper copy in the mail If you have any lab test that is abnormal or we need to change your treatment, we will call you to review the results.   Testing/Procedures: NONE   Follow-Up: At [facility], you and your health needs are our priority.  As part of our continuing mission to provide you with exceptional heart care, we have created designated Provider Care Teams.  These Care Teams include your primary Cardiologist (physician) and Advanced Practice Providers (APPs -  Physician Assistants and Nurse Practitioners) who all work together to provide you with the care you need, when you need it.   Your next appointment:   1 year(s)  Provider:   Riley Lam, MD or Jari Favre, [name]

## 2022-08-03 ENCOUNTER — Other Ambulatory Visit: Payer: Self-pay | Admitting: Physician Assistant

## 2022-08-03 ENCOUNTER — Other Ambulatory Visit: Payer: Self-pay

## 2022-08-03 DIAGNOSIS — I480 Paroxysmal atrial fibrillation: Secondary | ICD-10-CM

## 2022-08-03 MED ORDER — METOPROLOL SUCCINATE ER 25 MG PO TB24: ORAL_TABLET | ORAL | 3 refills | Status: DC

## 2022-08-03 MED ORDER — FLECAINIDE ACETATE 50 MG PO TABS: 50.0000 mg | ORAL_TABLET | Freq: Two times a day (BID) | ORAL | 3 refills | Status: DC

## 2022-08-03 NOTE — Telephone Encounter (Signed)
Prescription refill request for Eliquis received. Indication: Afib  Last office visit: 07/01/22 (Chandraekhar) Scr: 0.76 (05/10/21)  Age: 58 Weight: 126.1kg  Appropriate dose. Refill sent.

## 2022-08-09 ENCOUNTER — Other Ambulatory Visit: Payer: Self-pay | Admitting: *Deleted

## 2022-08-30 ENCOUNTER — Encounter (HOSPITAL_COMMUNITY): Payer: Self-pay

## 2022-08-30 ENCOUNTER — Ambulatory Visit (HOSPITAL_COMMUNITY)
Admission: RE | Admit: 2022-08-30 | Discharge: 2022-08-30 | Disposition: A | Discharge status: Home / Self Care | Payer: 59 | Source: Ambulatory Visit | Attending: Family Medicine | Admitting: Family Medicine

## 2022-08-30 VITALS — BP 101/69 | HR 75 | Temp 98.2°F | Resp 18

## 2022-08-30 DIAGNOSIS — E1165 Type 2 diabetes mellitus with hyperglycemia: Secondary | ICD-10-CM | POA: Insufficient documentation

## 2022-08-30 LAB — COMPREHENSIVE METABOLIC PANEL
ALT: 29 U/L (ref 0–44)
AST: 29 U/L (ref 15–41)
Albumin: 3.8 g/dL (ref 3.5–5.0)
Alkaline Phosphatase: 70 U/L (ref 38–126)
Anion gap: 14 (ref 5–15)
BUN: 22 mg/dL — ABNORMAL HIGH (ref 6–20)
CO2: 24 mmol/L (ref 22–32)
Calcium: 9.7 mg/dL (ref 8.9–10.3)
Chloride: 98 mmol/L (ref 98–111)
Creatinine, Ser: 0.75 mg/dL (ref 0.61–1.24)
GFR, Estimated: >60 mL/min (ref >60)
Glucose, Bld: 175 mg/dL — ABNORMAL HIGH (ref 70–99)
Potassium: 4.6 mmol/L (ref 3.5–5.1)
Sodium: 136 mmol/L (ref 135–145)
Total Bilirubin: 0.9 mg/dL (ref 0.3–1.2)
Total Protein: 7.2 g/dL (ref 6.5–8.1)

## 2022-08-30 LAB — CBC WITH DIFFERENTIAL/PLATELET
Abs Immature Granulocytes: 0.02 10*3/uL (ref 0.00–0.07)
Basophils Absolute: 0.1 10*3/uL (ref 0.0–0.1)
Basophils Relative: 1 %
Eosinophils Absolute: 0.4 10*3/uL (ref 0.0–0.5)
Eosinophils Relative: 6 %
HCT: 38.7 % — ABNORMAL LOW (ref 39.0–52.0)
Hemoglobin: 12.6 g/dL — ABNORMAL LOW (ref 13.0–17.0)
Immature Granulocytes: 0 %
Lymphocytes Relative: 28 %
Lymphs Abs: 2 10*3/uL (ref 0.7–4.0)
MCH: 30.7 pg (ref 26.0–34.0)
MCHC: 32.6 g/dL (ref 30.0–36.0)
MCV: 94.4 fL (ref 80.0–100.0)
Monocytes Absolute: 0.6 10*3/uL (ref 0.1–1.0)
Monocytes Relative: 9 %
Neutro Abs: 3.8 10*3/uL (ref 1.7–7.7)
Neutrophils Relative %: 56 %
Platelets: 151 10*3/uL (ref 150–400)
RBC: 4.1 MIL/uL — ABNORMAL LOW (ref 4.22–5.81)
RDW: 13.9 % (ref 11.5–15.5)
WBC: 6.9 10*3/uL (ref 4.0–10.5)
nRBC: 0 % (ref 0.0–0.2)

## 2022-08-30 LAB — HEMOGLOBIN A1C
Hgb A1c MFr Bld: 13.5 % — ABNORMAL HIGH (ref 4.8–5.6)
Mean Plasma Glucose: 340.75 mg/dL

## 2022-08-30 LAB — POCT URINALYSIS DIP (MANUAL ENTRY)
Bilirubin, UA: NEGATIVE
Blood, UA: NEGATIVE
Glucose, UA: >=1000 mg/dL — AB
Leukocytes, UA: NEGATIVE
Nitrite, UA: NEGATIVE
Protein Ur, POC: NEGATIVE mg/dL
Spec Grav, UA: 1.01 (ref 1.010–1.025)
Urobilinogen, UA: 0.2 E.U./dL
pH, UA: 5 (ref 5.0–8.0)

## 2022-08-30 LAB — POCT FASTING CBG KUC MANUAL ENTRY: POCT Glucose (KUC): 195 mg/dL — AB (ref 70–99)

## 2022-08-30 NOTE — Discharge Instructions (Signed)
You have had labs (blood tests) sent today. We will call you with any significant abnormalities or if there is need to begin or change treatment or pursue further follow up.  You may also review your test results online through MyChart. If you do not have a MyChart account, instructions to sign up should be on your discharge paperwork.  

## 2022-08-30 NOTE — ED Triage Notes (Signed)
Pt states hasn't been feeling good in the past few days and yesterday had blurry vision. States they checked his sugar at work and it was 321 and today was 220. States feels sluggish today.

## 2022-09-03 NOTE — ED Provider Notes (Signed)
[facility]   [id number] 08/30/22 Arrival Time: 1006  ASSESSMENT & PLAN:  1. Type 2 diabetes mellitus with hyperglycemia, without long-term current use of insulin (HCC)    Labs Reviewed  CBC WITH DIFFERENTIAL/PLATELET - Abnormal; Notable for the following components:      Result Value   RBC 4.10 (*)    Hemoglobin 12.6 (*)    HCT 38.7 (*)    All other components within normal limits  COMPREHENSIVE METABOLIC PANEL - Abnormal; Notable for the following components:   Glucose, Bld 175 (*)    BUN 22 (*)    All other components within normal limits  HEMOGLOBIN A1C - Abnormal; Notable for the following components:   Hgb A1c MFr Bld 13.5 (*)    All other components within normal limits  POCT FASTING CBG KUC MANUAL ENTRY - Abnormal; Notable for the following components:   POCT Glucose (KUC) 195 (*)    All other components within normal limits  POCT URINALYSIS DIP (MANUAL ENTRY) - Abnormal; Notable for the following components:   Glucose, UA >=1,000 (*)    Ketones, POC UA trace (5) (*)    All other components within normal limits   Does not appear to be very compliant with medication use. No risk of DKA at this time. Encourage to f/u with PCP asap.   Reviewed expectations re: course of current medical issues. Questions answered. Outlined signs and symptoms indicating need for more acute intervention. Patient verbalized understanding. After Visit Summary given.   SUBJECTIVE: History from: patient. Juan Schwartz is a 57 y.o. male who presents with complaint of not feeling good in the past few days and yesterday had blurry vision. States they checked his sugar at work and it was 321 and today was 220. States feels sluggish today. Normal PO intake without n/v/d. Normal urination.  OBJECTIVE:  Vitals:   08/30/22 1042  BP: 101/69  Pulse: 75  Resp: 18  Temp: 98.2 F (36.8 C)  TempSrc: Oral  SpO2: 98%    General appearance: alert; no distress Eyes: PERRLA;  EOMI; conjunctiva normal HENT: normocephalic; atraumatic Lungs: clear to auscultation bilaterally; unlabored Heart: regular rate and rhythm without murmer Abdomen: soft, non-tender Back: no CVA tenderness Extremities: no edema; symmetrical with no gross deformities Skin: warm and dry Neurologic: normal gait; normal symmetric reflexes Psychological: alert and cooperative; normal mood and affect  Labs: Results for orders placed or performed during the hospital encounter of 08/30/22  CBC with Differential/Platelet  Result Value Ref Range   WBC 6.9 4.0 - 10.5 K/uL   RBC 4.10 (L) 4.22 - 5.81 MIL/uL   Hemoglobin 12.6 (L) 13.0 - 17.0 g/dL   HCT 40.9 (L) 81.1 - 91.4 %   MCV 94.4 80.0 - 100.0 fL   MCH 30.7 26.0 - 34.0 pg   MCHC 32.6 30.0 - 36.0 g/dL   RDW 78.2 95.6 - 21.3 %   Platelets 151 150 - 400 K/uL   nRBC 0.0 0.0 - 0.2 %   Neutrophils Relative % 56 %   Neutro Abs 3.8 1.7 - 7.7 K/uL   Lymphocytes Relative 28 %   Lymphs Abs 2.0 0.7 - 4.0 K/uL   Monocytes Relative 9 %   Monocytes Absolute 0.6 0.1 - 1.0 K/uL   Eosinophils Relative 6 %   Eosinophils Absolute 0.4 0.0 - 0.5 K/uL   Basophils Relative 1 %   Basophils Absolute 0.1 0.0 - 0.1 K/uL   Immature Granulocytes 0 %   Abs Immature  Granulocytes 0.02 0.00 - 0.07 K/uL  Comprehensive metabolic panel  Result Value Ref Range   Sodium 136 135 - 145 mmol/L   Potassium 4.6 3.5 - 5.1 mmol/L   Chloride 98 98 - 111 mmol/L   CO2 24 22 - 32 mmol/L   Glucose, Bld 175 (H) 70 - 99 mg/dL   BUN 22 (H) 6 - 20 mg/dL   Creatinine, Ser 0.93 0.61 - 1.24 mg/dL   Calcium 9.7 8.9 - 23.5 mg/dL   Total Protein 7.2 6.5 - 8.1 g/dL   Albumin 3.8 3.5 - 5.0 g/dL   AST 29 15 - 41 U/L   ALT 29 0 - 44 U/L   Alkaline Phosphatase 70 38 - 126 U/L   Total Bilirubin 0.9 0.3 - 1.2 mg/dL   GFR, Estimated >57 >32 mL/min   Anion gap 14 5 - 15  Hemoglobin A1c  Result Value Ref Range   Hgb A1c MFr Bld 13.5 (H) 4.8 - 5.6 %   Mean Plasma Glucose 340.75 mg/dL  POC  CBG monitoring  Result Value Ref Range   POCT Glucose (KUC) 195 (A) 70 - 99 mg/dL  POC urinalysis dipstick  Result Value Ref Range   Color, UA yellow yellow   Clarity, UA clear clear   Glucose, UA >=1,000 (A) negative mg/dL   Bilirubin, UA negative negative   Ketones, POC UA trace (5) (A) negative mg/dL   Spec Grav, UA 2.025 4.270 - 1.025   Blood, UA negative negative   pH, UA 5.0 5.0 - 8.0   Protein Ur, POC negative negative mg/dL   Urobilinogen, UA 0.2 0.2 or 1.0 E.U./dL   Nitrite, UA Negative Negative   Leukocytes, UA Negative Negative   Labs Reviewed  CBC WITH DIFFERENTIAL/PLATELET - Abnormal; Notable for the following components:      Result Value   RBC 4.10 (*)    Hemoglobin 12.6 (*)    HCT 38.7 (*)    All other components within normal limits  COMPREHENSIVE METABOLIC PANEL - Abnormal; Notable for the following components:   Glucose, Bld 175 (*)    BUN 22 (*)    All other components within normal limits  HEMOGLOBIN A1C - Abnormal; Notable for the following components:   Hgb A1c MFr Bld 13.5 (*)    All other components within normal limits  POCT FASTING CBG KUC MANUAL ENTRY - Abnormal; Notable for the following components:   POCT Glucose (KUC) 195 (*)    All other components within normal limits  POCT URINALYSIS DIP (MANUAL ENTRY) - Abnormal; Notable for the following components:   Glucose, UA >=1,000 (*)    Ketones, POC UA trace (5) (*)    All other components within normal limits    Imaging: No results found.  No Known Allergies  Past Medical History:  Diagnosis Date   Allergy    Atrial fibrillation (HCC) 08/04/2015   CHA2DS2-VASc = 3   Dehydration 08/04/2015   Diabetes mellitus, type 2 (HCC) 11/29/2011   Essential hypertension 08/04/2015   Hyperlipidemia    Hypertension    Mild sleep apnea 2017   NICM (nonischemic cardiomyopathy) (HCC)    Normal coronary arteries 2004   Obesity 11/29/2011   Social History   Socioeconomic History   Marital  status: Married    Spouse name: Not on file   Number of children: Not on file   Years of education: Not on file   Highest education level: Not on file  Occupational History  Not on file  Tobacco Use   Smoking status: Former   Smokeless tobacco: Never  Vaping Use   Vaping status: Never Used  Substance and Sexual Activity   Alcohol use: Yes    Alcohol/week: 4.0 standard drinks of alcohol    Types: 2 Glasses of wine, 2 Cans of beer per week    Comment: occ   Drug use: No   Sexual activity: Yes  Other Topics Concern   Not on file  Social History Narrative   Lives in [place]   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   Social Determinants of Health   Financial Resource Strain: Low Risk  (08/31/2022)   Overall Financial Resource Strain (CARDIA)    Difficulty of Paying Living Expenses: Not very hard  Food Insecurity: No Food Insecurity (08/31/2022)   Hunger Vital Sign    Worried About Running Out of Food in the Last Year: Never true    Ran Out of Food in the Last Year: Never true  Transportation Needs: No Transportation Needs (08/31/2022)   PRAPARE - Administrator, Civil Service (Medical): No    Lack of Transportation (Non-Medical): No  Physical Activity: Unknown (08/31/2022)   Exercise Vital Sign    Days of Exercise per Week: 0 days    Minutes of Exercise per Session: Not on file  Stress: No Stress Concern Present (08/31/2022)   [employer] of Occupational Health - Occupational Stress Questionnaire    Feeling of Stress : Only a little  Social Connections: Socially Isolated (08/31/2022)   Social Connection and Isolation Panel [NHANES]    Frequency of Communication with Friends and Family: Twice a week    Frequency of Social Gatherings with Friends and Family: Never    Attends Religious Services: Never    Database administrator or Organizations: No    Attends Engineer, structural: Not on file    Marital Status: Married  Catering manager  Violence: Not on file   Family History  Problem Relation Age of Onset   Diabetes Mother    Past Surgical History:  Procedure Laterality Date   CARDIAC CATHETERIZATION  2005   CARDIOVERSION N/A 08/05/2015   Procedure: CARDIOVERSION;  Surgeon: Jake Bathe, MD;  Location: [facility] ENDOSCOPY;  Service: Cardiovascular;  Laterality: N/A;   CARDIOVERSION N/A 07/19/2017   Procedure: CARDIOVERSION;  Surgeon: Elease Hashimoto, Deloris Ping, MD;  Location: [facility] ENDOSCOPY;  Service: Cardiovascular;  Laterality: N/A;   CARDIOVERSION N/A 03/25/2019   Procedure: CARDIOVERSION;  Surgeon: Jake Bathe, MD;  Location: [facility] ENDOSCOPY;  Service: Cardiovascular;  Laterality: N/A;   HERNIA REPAIR     TEE WITHOUT CARDIOVERSION N/A 08/05/2015   Procedure: TRANSESOPHAGEAL ECHOCARDIOGRAM (TEE);  Surgeon: Jake Bathe, MD;  Location: [facility] ENDOSCOPY;  Service: Cardiovascular;  Laterality: N/A;     Mardella Layman, MD 09/03/22 1101

## 2022-09-06 ENCOUNTER — Ambulatory Visit: Payer: 59 | Admitting: Nurse Practitioner

## 2022-09-06 ENCOUNTER — Encounter: Payer: Self-pay | Admitting: Nurse Practitioner

## 2022-09-06 VITALS — BP 106/60 | HR 73 | Resp 16 | Ht 67.0 in | Wt 280.0 lb

## 2022-09-06 DIAGNOSIS — I10 Essential (primary) hypertension: Secondary | ICD-10-CM

## 2022-09-06 DIAGNOSIS — I480 Paroxysmal atrial fibrillation: Secondary | ICD-10-CM | POA: Diagnosis not present

## 2022-09-06 DIAGNOSIS — Z1211 Encounter for screening for malignant neoplasm of colon: Secondary | ICD-10-CM | POA: Insufficient documentation

## 2022-09-06 DIAGNOSIS — E119 Type 2 diabetes mellitus without complications: Secondary | ICD-10-CM

## 2022-09-06 DIAGNOSIS — I509 Heart failure, unspecified: Secondary | ICD-10-CM

## 2022-09-06 DIAGNOSIS — Z91199 Patient's noncompliance with other medical treatment and regimen due to unspecified reason: Secondary | ICD-10-CM

## 2022-09-06 DIAGNOSIS — E782 Mixed hyperlipidemia: Secondary | ICD-10-CM

## 2022-09-06 MED ORDER — GLIPIZIDE 10 MG PO TABS
10.0000 mg | ORAL_TABLET | Freq: Two times a day (BID) | ORAL | 3 refills | Status: DC
Start: 2022-09-06 — End: 2022-12-12

## 2022-09-06 MED ORDER — METFORMIN HCL ER 500 MG PO TB24: 1000.0000 mg | ORAL_TABLET | Freq: Every day | ORAL | 3 refills | Status: DC

## 2022-09-06 MED ORDER — BLOOD GLUCOSE MONITORING SUPPL DEVI
1.0000 | Freq: Three times a day (TID) | 0 refills | Status: DC
Start: 2022-09-06 — End: 2022-09-06

## 2022-09-06 MED ORDER — LANCET DEVICE MISC
0 refills | Status: DC
Start: 2022-09-06 — End: 2022-09-06

## 2022-09-06 MED ORDER — BLOOD GLUCOSE TEST VI STRP
ORAL_STRIP | 0 refills | Status: DC
Start: 2022-09-06 — End: 2022-09-06

## 2022-09-06 MED ORDER — ONETOUCH ULTRA MINI W/DEVICE KIT: PACK | 0 refills | Status: AC

## 2022-09-06 MED ORDER — JARDIANCE 25 MG PO TABS
25.0000 mg | ORAL_TABLET | Freq: Every day | ORAL | 0 refills | Status: DC
Start: 2022-09-06 — End: 2022-10-04

## 2022-09-06 NOTE — Assessment & Plan Note (Signed)
He is currently established with cardiology Takes Jardiance 25 mg daily metoprolol 25 mg daily Patient encouraged to maintain close follow-up with cardiology

## 2022-09-06 NOTE — Progress Notes (Signed)
New Patient Office Visit  Subjective:  Patient ID: Juan Schwartz, male    DOB: Jun 23, 1965  Age: 57 y.o. MRN: [id number]  CC:  Chief Complaint  Patient presents with   Establish Care   Diabetes    HPI Juan Schwartz is a 57 y.o. male  has a past medical history of Allergy, Atrial fibrillation (HCC) (08/04/2015), Dehydration (08/04/2015), Diabetes mellitus, type 2 (HCC) (11/29/2011), Essential hypertension (08/04/2015), Hyperlipidemia, Hypertension, Mild sleep apnea (2017), NICM (nonischemic cardiomyopathy) (HCC), Normal coronary arteries (2004), and Obesity (11/29/2011).  Patient presents to establish care for his chronic medical conditions.  Previous patient at [facility].   Uncontrolled type 2 diabetes.  Patient stated that he has not been taking his medication until his recent visit to the emergency department on 08/30/2022.  He is currently on metformin 1000 mg twice daily(takes 1000 mg daily due to side effect of diarrhea) Jardiance 25 mg daily, glipizide 10 mg twice daily.  He reports fatigue, polyuria polydipsia.  He denies hypoglycemia.  He has not been checking his blood sugar.  He has also been avoiding soda juice since last week, states that he does walking exercises on some days.  Has upcoming diabetic eye exam at eye [facility]  in [place]   He is also followed by cardiology for A-fib, chronic heart failure.   Due for colon cancer screening Cologuard ordered.   Past Medical History:  Diagnosis Date   Allergy    Atrial fibrillation (HCC) 08/04/2015   CHA2DS2-VASc = 3   Dehydration 08/04/2015   Diabetes mellitus, type 2 (HCC) 11/29/2011   Essential hypertension 08/04/2015   Hyperlipidemia    Hypertension    Mild sleep apnea 2017   NICM (nonischemic cardiomyopathy) (HCC)    Normal coronary arteries 2004   Obesity 11/29/2011    Past Surgical History:  Procedure Laterality Date   CARDIAC CATHETERIZATION  2005   CARDIOVERSION N/A 08/05/2015    Procedure: CARDIOVERSION;  Surgeon: Jake Bathe, MD;  Location: [facility] ENDOSCOPY;  Service: Cardiovascular;  Laterality: N/A;   CARDIOVERSION N/A 07/19/2017   Procedure: CARDIOVERSION;  Surgeon: Vesta Mixer, MD;  Location: [facility] ENDOSCOPY;  Service: Cardiovascular;  Laterality: N/A;   CARDIOVERSION N/A 03/25/2019   Procedure: CARDIOVERSION;  Surgeon: Jake Bathe, MD;  Location: [facility] ENDOSCOPY;  Service: Cardiovascular;  Laterality: N/A;   HERNIA REPAIR     TEE WITHOUT CARDIOVERSION N/A 08/05/2015   Procedure: TRANSESOPHAGEAL ECHOCARDIOGRAM (TEE);  Surgeon: Jake Bathe, MD;  Location: [facility] ENDOSCOPY;  Service: Cardiovascular;  Laterality: N/A;    Family History  Problem Relation Age of Onset   Diabetes Mother     Social History   Socioeconomic History   Marital status: Married    Spouse name: Not on file   Number of children: Not on file   Years of education: Not on file   Highest education level: Not on file  Occupational History   Not on file  Tobacco Use   Smoking status: Former    Passive exposure: Past   Smokeless tobacco: Never  Vaping Use   Vaping status: Never Used  Substance and Sexual Activity   Alcohol use: Yes    Alcohol/week: 4.0 standard drinks of alcohol    Types: 2 Glasses of wine, 2 Cans of beer per week    Comment: occ   Drug use: No   Sexual activity: Yes  Other Topics Concern   Not on file  Social History Narrative   Lives  in [place]   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   Social Determinants of Health   Financial Resource Strain: Low Risk  (08/31/2022)   Overall Financial Resource Strain (CARDIA)    Difficulty of Paying Living Expenses: Not very hard  Food Insecurity: No Food Insecurity (08/31/2022)   Hunger Vital Sign    Worried About Running Out of Food in the Last Year: Never true    Ran Out of Food in the Last Year: Never true  Transportation Needs: No Transportation Needs (08/31/2022)   PRAPARE - Scientist, research (physical sciences) (Medical): No    Lack of Transportation (Non-Medical): No  Physical Activity: Unknown (08/31/2022)   Exercise Vital Sign    Days of Exercise per Week: 0 days    Minutes of Exercise per Session: Not on file  Stress: No Stress Concern Present (08/31/2022)   [employer] of Occupational Health - Occupational Stress Questionnaire    Feeling of Stress : Only a little  Social Connections: Socially Isolated (08/31/2022)   Social Connection and Isolation Panel [NHANES]    Frequency of Communication with Friends and Family: Twice a week    Frequency of Social Gatherings with Friends and Family: Never    Attends Religious Services: Never    Database administrator or Organizations: No    Attends Engineer, structural: Not on file    Marital Status: Married  Catering manager Violence: Not on file    ROS Review of Systems  Constitutional:  Positive for fatigue. Negative for activity change, appetite change, chills, diaphoresis, fever and unexpected weight change.  HENT:  Negative for congestion, dental problem, drooling and ear discharge.   Eyes:  Negative for pain, discharge, redness and itching.  Respiratory:  Negative for apnea, cough, choking, chest tightness, shortness of breath and wheezing.   Cardiovascular: Negative.  Negative for chest pain, palpitations and leg swelling.  Gastrointestinal:  Negative for abdominal distention, abdominal pain, anal bleeding, blood in stool, constipation, diarrhea and vomiting.  Endocrine: Positive for polydipsia and polyuria. Negative for polyphagia.  Genitourinary:  Negative for difficulty urinating, flank pain, frequency and genital sores.  Musculoskeletal: Negative.  Negative for arthralgias, back pain, gait problem and joint swelling.  Skin:  Negative for color change, pallor and rash.  Neurological:  Negative for dizziness, facial asymmetry, light-headedness, numbness and headaches.  Psychiatric/Behavioral:  Negative for  agitation, behavioral problems, confusion, hallucinations, self-injury, sleep disturbance and suicidal ideas.     Objective:   Today's Vitals: BP 106/60 (BP Location: Left Arm, Patient Position: Sitting, Cuff Size: Large)   Pulse 73   Resp 16   Ht 5\' 7"  (1.702 m)   Wt 280 lb (127 kg)   SpO2 100%   BMI 43.85 kg/m   Physical Exam Vitals and nursing note reviewed.  Constitutional:      General: He is not in acute distress.    Appearance: Normal appearance. He is obese. He is not ill-appearing, toxic-appearing or diaphoretic.  HENT:     Mouth/Throat:     Mouth: Mucous membranes are moist.     Pharynx: Oropharynx is clear. No oropharyngeal exudate or posterior oropharyngeal erythema.  Eyes:     General: No scleral icterus.       Right eye: No discharge.        Left eye: No discharge.     Extraocular Movements: Extraocular movements intact.     Conjunctiva/sclera: Conjunctivae normal.  Cardiovascular:     Rate  and Rhythm: Normal rate and regular rhythm.     Pulses: Normal pulses.     Heart sounds: Normal heart sounds. No murmur heard.    No friction rub. No gallop.  Pulmonary:     Effort: Pulmonary effort is normal. No respiratory distress.     Breath sounds: Normal breath sounds. No stridor. No wheezing, rhonchi or rales.  Chest:     Chest wall: No tenderness.  Abdominal:     General: There is no distension.     Palpations: Abdomen is soft.     Tenderness: There is no abdominal tenderness. There is no right CVA tenderness, left CVA tenderness or guarding.  Musculoskeletal:        General: No swelling, tenderness, deformity or signs of injury.     Right lower leg: No edema.     Left lower leg: No edema.  Skin:    General: Skin is warm and dry.     Capillary Refill: Capillary refill takes less than 2 seconds.     Coloration: Skin is not jaundiced or pale.     Findings: No bruising, erythema or lesion.  Neurological:     Mental Status: He is alert and oriented to person,  place, and time.     Motor: No weakness.     Coordination: Coordination normal.     Gait: Gait normal.  Psychiatric:        Mood and Affect: Mood normal.        Behavior: Behavior normal.        Thought Content: Thought content normal.        Judgment: Judgment normal.     Assessment & Plan:   Problem List Items Addressed This Visit       Cardiovascular and Mediastinum   Diabetes mellitus with coincident hypertension (HCC) - Primary    Lab Results  Component Value Date   HGBA1C 13.5 (H) 08/30/2022  Chronic uncontrolled condition Continue Jardiance 25 mg daily, glipizide 10 mg twice daily, start metformin XR 1000 mg twice daily. CBG goals discussed patient encouraged to check his blood sugars twice daily Call the office to Report blood sugar readings less than 70 Patient counseled on low-carb modified diet Encouraged to engage in regular moderate exercises at least 150 minutes weekly Patient referred for diabetes education Referral sent to the clinical pharmacist to assist with management of diabetes Urine albumin creatinine labs ordered Blood pressure is at goal of less than 130/80 on lisinopril 2.5 mg daily, metoprolol 25 mg daily Follow-up in 4 weeks      Relevant Medications   metFORMIN (GLUCOPHAGE-XR) 500 MG 24 hr tablet   glipiZIDE (GLUCOTROL) 10 MG tablet   JARDIANCE 25 MG TABS tablet   Blood Glucose Monitoring Suppl (ONE TOUCH ULTRA MINI) w/Device KIT   Other Relevant Orders   Microalbumin/Creatinine Ratio, Urine   Ambulatory referral to Ophthalmology   AMB Referral to Pharmacy Medication Management   Amb Referral to Nutrition and Diabetic Education   Paroxysmal atrial fibrillation (HCC)    Continue flecainide 50 mg twice daily, Eliquis 5 mg twice daily, metoprolol 25 mg daily Encouraged to maintain close follow-up with cardiology      Chronic heart failure [facility])    He is currently established with cardiology Takes Jardiance 25 mg daily metoprolol 25 mg  daily Patient encouraged to maintain close follow-up with cardiology        Other   Morbid obesity (HCC)    Wt Readings from Last 3 Encounters:  09/06/22 280 lb (127 kg)  07/01/22 278 lb (126.1 kg)  09/29/21 287 lb (130.2 kg)   Body mass index is 43.85 kg/m.  Patient counseled on low-carb modified diet Encouraged to engage in regular moderate exercises at least 150 minutes weekly as tolerated      Relevant Medications   metFORMIN (GLUCOPHAGE-XR) 500 MG 24 hr tablet   glipiZIDE (GLUCOTROL) 10 MG tablet   JARDIANCE 25 MG TABS tablet   Mixed hyperlipidemia    Lab Results  Component Value Date   LDLCALC 54 08/10/2021  On Crestor 20 mg daily Continue current medication      Screening for colon cancer   Relevant Orders   Cologuard    Outpatient Encounter Medications as of 09/06/2022  Medication Sig   acetaminophen (TYLENOL) 650 MG CR tablet Take 650 mg by mouth every 8 (eight) hours as needed for pain.   apixaban (ELIQUIS) 5 MG TABS tablet Take 1 tablet by mouth twice daily   flecainide (TAMBOCOR) 50 MG tablet Take 1 tablet (50 mg total) by mouth 2 (two) times daily.   glucose blood test strip 1 each by Other route. Use to check blood sugars 2x daily   lisinopril (ZESTRIL) 2.5 MG tablet TAKE 1 TABLET BY MOUTH ONCE DAILY MUST  KEEP  APPOINTMENT  IN  JULY   metFORMIN (GLUCOPHAGE-XR) 500 MG 24 hr tablet Take 2 tablets (1,000 mg total) by mouth daily with breakfast.   metoprolol succinate (TOPROL-XL) 25 MG 24 hr tablet TAKE 1 TABLET BY MOUTH TWICE DAILY .  MAY  TAKE  AN  EXTRA  TABLET  AS  NEEDED  FOR  BREAKTHROUGH.   rosuvastatin (CRESTOR) 20 MG tablet Take 1 tablet (20 mg total) by mouth daily.   sildenafil (VIAGRA) 100 MG tablet Take 1 tablet by mouth as needed.   [DISCONTINUED] Blood Glucose Monitoring Suppl DEVI 1 each by Does not apply route in the morning, at noon, and at bedtime. May substitute to any manufacturer covered by patient's insurance.   [DISCONTINUED] glipiZIDE  (GLUCOTROL) 10 MG tablet Take 10 mg by mouth 2 (two) times daily.   [DISCONTINUED] Glucose Blood (BLOOD GLUCOSE TEST STRIPS) STRP Use to check blood sugars 2x daily.  May substitute to any manufacturer covered by patient's insurance.   [DISCONTINUED] JARDIANCE 25 MG TABS tablet Take 25 mg by mouth daily.   [DISCONTINUED] Lancet Device MISC Use as directed  May substitute to any manufacturer covered by patient's insurance.   [DISCONTINUED] metFORMIN (GLUCOPHAGE) 1000 MG tablet Take 1 tablet (1,000 mg total) by mouth 2 (two) times daily with a meal.   Blood Glucose Monitoring Suppl (ONE TOUCH ULTRA MINI) w/Device KIT Use to check blood glucose 2x daily   glipiZIDE (GLUCOTROL) 10 MG tablet Take 1 tablet (10 mg total) by mouth 2 (two) times daily before a meal.   JARDIANCE 25 MG TABS tablet Take 1 tablet (25 mg total) by mouth daily.   meloxicam (MOBIC) 15 MG tablet Take 15 mg by mouth as needed for pain. (Patient not taking: Reported on 09/06/2022)   [DISCONTINUED] Blood Glucose Monitoring Suppl (ONE TOUCH ULTRA MINI) w/Device KIT    No facility-administered encounter medications on file as of 09/06/2022.    Follow-up: Return in about 2 months (around 11/06/2022) for DM.   Donell Beers, FNP

## 2022-09-06 NOTE — Assessment & Plan Note (Signed)
Wt Readings from Last 3 Encounters:  09/06/22 280 lb (127 kg)  07/01/22 278 lb (126.1 kg)  09/29/21 287 lb (130.2 kg)   Body mass index is 43.85 kg/m.  Patient counseled on low-carb modified diet Encouraged to engage in regular moderate exercises at least 150 minutes weekly as tolerated

## 2022-09-06 NOTE — Patient Instructions (Signed)
1. Diabetes mellitus with coincident hypertension (HCC)  - Microalbumin/Creatinine Ratio, Urine - Ambulatory referral to Ophthalmology - metFORMIN (GLUCOPHAGE-XR) 500 MG 24 hr tablet; Take 2 tablets (1,000 mg total) by mouth daily with breakfast.  Dispense: 120 tablet; Refill: 3 - glipiZIDE (GLUCOTROL) 10 MG tablet; Take 1 tablet (10 mg total) by mouth 2 (two) times daily before a meal.  Dispense: 60 tablet; Refill: 3 - JARDIANCE 25 MG TABS tablet; Take 1 tablet (25 mg total) by mouth daily.  Dispense: 90 tablet; Refill: 0  2. Screening for colon cancer  - Cologuard    Goal for fasting blood sugar ranges from 80 to 120 and 2 hours after any meal or at bedtime should be between 130 to 170.    Marland Kitchen Regular exercise as tolerated, preferably 3 or more hours a week   . Hypoglycemia: a)  Do not drive or operate machinery without first testing blood glucose to assure it is over 90 mg%, or if dizzy, lightheaded, not feeling normal, etc, or  if foot or leg is numb or weak. b)  If blood glucose less than 70, take four 5gm Glucose tabs or 15-30 gm Glucose gel.  Repeat every 15 min as needed until blood sugar is >100 mg/dl. If hypoglycemia persists then call 911.    6. Sick day management: a) Check blood glucose more often b) Continue usual therapy if blood sugars are elevated.     Contact the doctor immediately if blood glucose is frequently <60 mg/dl, or an episode of severe hypoglycemia occurs (where someone had to give you glucose/  glucagon or if you passed out from a low blood glucose), or if blood glucose is persistently >350 mg/dl, for further management   It is important that you exercise regularly at least 30 minutes 5 times a week as tolerated  Think about what you will eat, plan ahead. Choose " clean, green, fresh or frozen" over canned, processed or packaged foods which are more sugary, salty and fatty. 70 to 75% of food eaten should be vegetables and fruit. Three meals at set times  with snacks allowed between meals, but they must be fruit or vegetables. Aim to eat over a 12 hour period , example 7 am to 7 pm, and STOP after  your last meal of the day. Drink water,generally about 64 ounces per day, no other drink is as healthy. Fruit juice is best enjoyed in a healthy way, by EATING the fruit.  Thanks for choosing [facility] we consider it a privelige to serve you.

## 2022-09-06 NOTE — Progress Notes (Signed)
Pt presents to establish care -urgent care visit for diabetes =13.5 on 08/04 -needs refill on Jardiance, Metformin, and Glipizide

## 2022-09-06 NOTE — Assessment & Plan Note (Signed)
Lab Results  Component Value Date   LDLCALC 54 08/10/2021  On Crestor 20 mg daily Continue current medication

## 2022-09-06 NOTE — Assessment & Plan Note (Addendum)
Lab Results  Component Value Date   HGBA1C 13.5 (H) 08/30/2022  Chronic uncontrolled condition Continue Jardiance 25 mg daily, glipizide 10 mg twice daily, start metformin XR 1000 mg twice daily. CBG goals discussed patient encouraged to check his blood sugars twice daily Call the office to Report blood sugar readings less than 70 Patient counseled on low-carb modified diet Encouraged to engage in regular moderate exercises at least 150 minutes weekly Patient referred for diabetes education Referral sent to the clinical pharmacist to assist with management of diabetes Urine albumin creatinine labs ordered Blood pressure is at goal of less than 130/80 on lisinopril 2.5 mg daily, metoprolol 25 mg daily Follow-up in 4 weeks

## 2022-09-06 NOTE — Assessment & Plan Note (Signed)
Need to take all medications as ordered and working to get his blood pressure under control discussed. Patient referred to the clinical pharmacist

## 2022-09-06 NOTE — Assessment & Plan Note (Signed)
Continue flecainide 50 mg twice daily, Eliquis 5 mg twice daily, metoprolol 25 mg daily Encouraged to maintain close follow-up with cardiology

## 2022-09-07 ENCOUNTER — Telehealth: Payer: Self-pay

## 2022-09-07 NOTE — Progress Notes (Signed)
   Care Guide Note  09/07/2022 Name: Juan Schwartz MRN: [id number] DOB: Nov 29, 1965  Referred by: Donell Beers, FNP Reason for referral : Care Management (Outreach to schedule with Pharm d )   Juan Schwartz is a 57 y.o. year old male who is a primary care patient of Donell Beers, FNP. Juan Schwartz was referred to the pharmacist for assistance related to DM.    Successful contact was made with the patient to discuss pharmacy services including being ready for the pharmacist to call at least 5 minutes before the scheduled appointment time, to have medication bottles and any blood sugar or blood pressure readings ready for review. The patient agreed to meet with the pharmacist via with the pharmacist via telephone visit on (date/time).  10/07/2022  Penne Lash, RMA Care Guide [facility]  [place], [place] [address] Direct Dial: [phone] [email or web address]

## 2022-10-04 ENCOUNTER — Encounter: Payer: Self-pay | Admitting: Pharmacist

## 2022-10-04 ENCOUNTER — Ambulatory Visit: Payer: 59 | Admitting: Nurse Practitioner

## 2022-10-04 ENCOUNTER — Encounter: Payer: Self-pay | Admitting: Nurse Practitioner

## 2022-10-04 VITALS — BP 100/70 | HR 74 | Temp 98.7°F | Resp 14 | Ht 67.0 in | Wt 280.0 lb

## 2022-10-04 DIAGNOSIS — E119 Type 2 diabetes mellitus without complications: Secondary | ICD-10-CM

## 2022-10-04 DIAGNOSIS — E782 Mixed hyperlipidemia: Secondary | ICD-10-CM | POA: Diagnosis not present

## 2022-10-04 DIAGNOSIS — Z1211 Encounter for screening for malignant neoplasm of colon: Secondary | ICD-10-CM

## 2022-10-04 DIAGNOSIS — I10 Essential (primary) hypertension: Secondary | ICD-10-CM | POA: Diagnosis not present

## 2022-10-04 MED ORDER — LISINOPRIL 2.5 MG PO TABS
2.5000 mg | ORAL_TABLET | Freq: Every day | ORAL | 1 refills | Status: DC
Start: 2022-10-04 — End: 2023-08-29

## 2022-10-04 MED ORDER — JARDIANCE 25 MG PO TABS
25.0000 mg | ORAL_TABLET | Freq: Every day | ORAL | 1 refills | Status: DC
Start: 2022-10-04 — End: 2023-06-07

## 2022-10-04 MED ORDER — ROSUVASTATIN CALCIUM 20 MG PO TABS
20.0000 mg | ORAL_TABLET | Freq: Every day | ORAL | 1 refills | Status: DC
Start: 2022-10-04 — End: 2023-06-05

## 2022-10-04 NOTE — Assessment & Plan Note (Signed)
Lab Results  Component Value Date   HGBA1C 13.5 (H) 08/30/2022   chronic uncontrolled condition, no diarrhea with metformin XR , taking 1000 mg daily Continue Jardiance 25 mg daily, glipizide 10 mg twice daily,  metformin XR 1000 mg daily CBG goals discussed patient encouraged to check his blood sugars twice daily Patient counseled on low-carb modified diet, has upcoming appointment with the dietitian for diabetic diet education Encouraged to engage in regular moderate exercises at least 150 minutes weekly Diabetic foot exam completed, education for diabetic footcare completed Blood pressure well-controlled lisinopril 2.5 mg daily, metoprolol 25 mg daily Follow-up in 2 months

## 2022-10-04 NOTE — Progress Notes (Signed)
Established Patient Office Visit  Subjective:  Patient ID: Juan Schwartz, male    DOB: [birth year]  Age: 57 y.o. MRN: [id number]  CC:  Chief Complaint  Patient presents with   Follow-up    HPI Juan Schwartz is a 57 y.o. male  has a past medical history of Allergy, Atrial fibrillation (HCC) (08/04/2015), Dehydration (08/04/2015), Diabetes mellitus, type 2 (HCC) (11/29/2011), Essential hypertension (08/04/2015), Hyperlipidemia, Hypertension, Mild sleep apnea (2017), NICM (nonischemic cardiomyopathy) (HCC), Normal coronary arteries (2004), and Obesity (11/29/2011).  Patient presents for follow-up for type 2 diabetes  Type 2 diabetes.  Patient reports fasting blood sugar in the range of 170s to 200s.  Currently on metformin 1000 mg daily glipizide 10 mg twice daily, Jardiance 25 mg daily.  Patient stated that he has been taking all his medications since his last visit.  Stated that he has made some changes to his diet, has been walking more.  Patient stated that his diabetic eye exam was rescheduled.   Due for shingles vaccine Tdap vaccine and flu vaccine.  Need for all vaccines discussed with the patient.  Patient declined the vaccines  Cologuard test was ordered at last visit but the patient stated that he has not received the testing kits, we will follow-up on this  Patient denies adverse reactions to current medications  Past Medical History:  Diagnosis Date   Allergy    Atrial fibrillation (HCC) 08/04/2015   CHA2DS2-VASc = 3   Dehydration 08/04/2015   Diabetes mellitus, type 2 (HCC) 11/29/2011   Essential hypertension 08/04/2015   Hyperlipidemia    Hypertension    Mild sleep apnea 2017   NICM (nonischemic cardiomyopathy) (HCC)    Normal coronary arteries 2004   Obesity 11/29/2011    Past Surgical History:  Procedure Laterality Date   CARDIAC CATHETERIZATION  2005   CARDIOVERSION N/A 08/05/2015   Procedure: CARDIOVERSION;  Surgeon: Jake Bathe, MD;  Location:  [facility] ENDOSCOPY;  Service: Cardiovascular;  Laterality: N/A;   CARDIOVERSION N/A 07/19/2017   Procedure: CARDIOVERSION;  Surgeon: Vesta Mixer, MD;  Location: [facility] ENDOSCOPY;  Service: Cardiovascular;  Laterality: N/A;   CARDIOVERSION N/A 03/25/2019   Procedure: CARDIOVERSION;  Surgeon: Jake Bathe, MD;  Location: [facility] ENDOSCOPY;  Service: Cardiovascular;  Laterality: N/A;   HERNIA REPAIR     TEE WITHOUT CARDIOVERSION N/A 08/05/2015   Procedure: TRANSESOPHAGEAL ECHOCARDIOGRAM (TEE);  Surgeon: Jake Bathe, MD;  Location: [facility] ENDOSCOPY;  Service: Cardiovascular;  Laterality: N/A;    Family History  Problem Relation Age of Onset   Diabetes Mother     Social History   Socioeconomic History   Marital status: Married    Spouse name: Not on file   Number of children: Not on file   Years of education: Not on file   Highest education level: Not on file  Occupational History   Not on file  Tobacco Use   Smoking status: Former    Passive exposure: Past   Smokeless tobacco: Never  Vaping Use   Vaping status: Never Used  Substance and Sexual Activity   Alcohol use: Yes    Alcohol/week: 4.0 standard drinks of alcohol    Types: 2 Glasses of wine, 2 Cans of beer per week    Comment: occ   Drug use: No   Sexual activity: Yes  Other Topics Concern   Not on file  Social History Narrative   Lives in [place]   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  Social Determinants of Health   Financial Resource Strain: Low Risk  (08/31/2022)   Overall Financial Resource Strain (CARDIA)    Difficulty of Paying Living Expenses: Not very hard  Food Insecurity: No Food Insecurity (08/31/2022)   Hunger Vital Sign    Worried About Running Out of Food in the Last Year: Never true    Ran Out of Food in the Last Year: Never true  Transportation Needs: No Transportation Needs (08/31/2022)   PRAPARE - Administrator, Civil Service (Medical): No    Lack of Transportation (Non-Medical):  No  Physical Activity: Unknown (08/31/2022)   Exercise Vital Sign    Days of Exercise per Week: 0 days    Minutes of Exercise per Session: Not on file  Stress: No Stress Concern Present (08/31/2022)   [employer] of Occupational Health - Occupational Stress Questionnaire    Feeling of Stress : Only a little  Social Connections: Socially Isolated (08/31/2022)   Social Connection and Isolation Panel [NHANES]    Frequency of Communication with Friends and Family: Twice a week    Frequency of Social Gatherings with Friends and Family: Never    Attends Religious Services: Never    Database administrator or Organizations: No    Attends Engineer, structural: Not on file    Marital Status: Married  Catering manager Violence: Not on file    Outpatient Medications Prior to Visit  Medication Sig Dispense Refill   acetaminophen (TYLENOL) 650 MG CR tablet Take 650 mg by mouth every 8 (eight) hours as needed for pain.     apixaban (ELIQUIS) 5 MG TABS tablet Take 1 tablet by mouth twice daily 180 tablet 1   Blood Glucose Monitoring Suppl (ONE TOUCH ULTRA MINI) w/Device KIT Use to check blood glucose 2x daily 1 kit 0   flecainide (TAMBOCOR) 50 MG tablet Take 1 tablet (50 mg total) by mouth 2 (two) times daily. 180 tablet 3   glipiZIDE (GLUCOTROL) 10 MG tablet Take 1 tablet (10 mg total) by mouth 2 (two) times daily before a meal. 60 tablet 3   glucose blood test strip 1 each by Other route. Use to check blood sugars 2x daily     metFORMIN (GLUCOPHAGE-XR) 500 MG 24 hr tablet Take 2 tablets (1,000 mg total) by mouth daily with breakfast. 120 tablet 3   metoprolol succinate (TOPROL-XL) 25 MG 24 hr tablet TAKE 1 TABLET BY MOUTH TWICE DAILY .  MAY  TAKE  AN  EXTRA  TABLET  AS  NEEDED  FOR  BREAKTHROUGH. 225 tablet 3   sildenafil (VIAGRA) 100 MG tablet Take 1 tablet by mouth as needed.     JARDIANCE 25 MG TABS tablet Take 1 tablet (25 mg total) by mouth daily. 90 tablet 0   lisinopril (ZESTRIL) 2.5  MG tablet TAKE 1 TABLET BY MOUTH ONCE DAILY MUST  KEEP  APPOINTMENT  IN  JULY 60 tablet 1   rosuvastatin (CRESTOR) 20 MG tablet Take 1 tablet (20 mg total) by mouth daily. 90 tablet 0   meloxicam (MOBIC) 15 MG tablet Take 15 mg by mouth as needed for pain. (Patient not taking: Reported on 09/06/2022)     No facility-administered medications prior to visit.    No Known Allergies  ROS Review of Systems  Constitutional:  Negative for activity change, appetite change, chills, diaphoresis, fatigue, fever and unexpected weight change.  HENT:  Negative for congestion, dental problem, drooling and ear discharge.   Respiratory:  Negative for apnea, cough, choking, chest tightness, shortness of breath and wheezing.   Cardiovascular: Negative.  Negative for chest pain, palpitations and leg swelling.  Gastrointestinal:  Negative for abdominal distention, abdominal pain, anal bleeding, blood in stool, constipation, diarrhea and vomiting.  Genitourinary:  Negative for difficulty urinating, flank pain, frequency and genital sores.  Musculoskeletal: Negative.  Negative for arthralgias, back pain, gait problem and joint swelling.  Skin:  Negative for color change, pallor and rash.  Neurological:  Negative for dizziness, facial asymmetry, light-headedness, numbness and headaches.  Psychiatric/Behavioral:  Negative for agitation, behavioral problems, confusion, hallucinations, self-injury, sleep disturbance and suicidal ideas.       Objective:    Physical Exam Vitals and nursing note reviewed.  Constitutional:      General: He is not in acute distress.    Appearance: Normal appearance. He is obese. He is not ill-appearing, toxic-appearing or diaphoretic.  HENT:     Mouth/Throat:     Mouth: Mucous membranes are moist.     Pharynx: Oropharynx is clear. No oropharyngeal exudate or posterior oropharyngeal erythema.  Eyes:     General: No scleral icterus.       Right eye: No discharge.        Left eye:  No discharge.     Extraocular Movements: Extraocular movements intact.     Conjunctiva/sclera: Conjunctivae normal.  Cardiovascular:     Rate and Rhythm: Normal rate and regular rhythm.     Pulses: Normal pulses.     Heart sounds: Normal heart sounds. No murmur heard.    No friction rub. No gallop.  Pulmonary:     Effort: Pulmonary effort is normal. No respiratory distress.     Breath sounds: Normal breath sounds. No stridor. No wheezing, rhonchi or rales.  Chest:     Chest wall: No tenderness.  Abdominal:     General: There is no distension.     Palpations: Abdomen is soft.     Tenderness: There is no abdominal tenderness. There is no right CVA tenderness, left CVA tenderness or guarding.  Musculoskeletal:        General: No swelling, tenderness, deformity or signs of injury.     Right lower leg: No edema.     Left lower leg: No edema.  Skin:    General: Skin is warm and dry.     Capillary Refill: Capillary refill takes less than 2 seconds.     Coloration: Skin is not jaundiced or pale.     Findings: No bruising, erythema or lesion.  Neurological:     Mental Status: He is alert and oriented to person, place, and time.     Motor: No weakness.     Coordination: Coordination normal.     Gait: Gait normal.  Psychiatric:        Mood and Affect: Mood normal.        Behavior: Behavior normal.        Thought Content: Thought content normal.        Judgment: Judgment normal.     BP 100/70 (BP Location: Left Arm, Patient Position: Sitting, Cuff Size: Large)   Pulse 74   Temp 98.7 F (37.1 C)   Resp 14   Ht 5\' 7"  (1.702 m)   Wt 280 lb (127 kg)   SpO2 99%   BMI 43.85 kg/m  Wt Readings from Last 3 Encounters:  10/04/22 280 lb (127 kg)  09/06/22 280 lb (127 kg)  07/01/22 278 lb (126.1 kg)  Lab Results  Component Value Date   TSH 2.369 08/04/2015   Lab Results  Component Value Date   WBC 6.9 08/30/2022   HGB 12.6 (L) 08/30/2022   HCT 38.7 (L) 08/30/2022   MCV 94.4  08/30/2022   PLT 151 08/30/2022   Lab Results  Component Value Date   NA 136 08/30/2022   K 4.6 08/30/2022   CO2 24 08/30/2022   GLUCOSE 175 (H) 08/30/2022   BUN 22 (H) 08/30/2022   CREATININE 0.75 08/30/2022   BILITOT 0.9 08/30/2022   ALKPHOS 70 08/30/2022   AST 29 08/30/2022   ALT 29 08/30/2022   PROT 7.2 08/30/2022   ALBUMIN 3.8 08/30/2022   CALCIUM 9.7 08/30/2022   ANIONGAP 14 08/30/2022   EGFR 107 05/10/2021   Lab Results  Component Value Date   CHOL 128 08/10/2021   Lab Results  Component Value Date   HDL 54 08/10/2021   Lab Results  Component Value Date   LDLCALC 54 08/10/2021   Lab Results  Component Value Date   TRIG 112 08/10/2021   Lab Results  Component Value Date   CHOLHDL 2.4 08/10/2021   Lab Results  Component Value Date   HGBA1C 13.5 (H) 08/30/2022      Assessment & Plan:   Problem List Items Addressed This Visit       Cardiovascular and Mediastinum   Diabetes mellitus with coincident hypertension (HCC) - Primary    Lab Results  Component Value Date   HGBA1C 13.5 (H) 08/30/2022   chronic uncontrolled condition, no diarrhea with metformin XR , taking 1000 mg daily Continue Jardiance 25 mg daily, glipizide 10 mg twice daily,  metformin XR 1000 mg daily CBG goals discussed patient encouraged to check his blood sugars twice daily Patient counseled on low-carb modified diet, has upcoming appointment with the dietitian for diabetic diet education Encouraged to engage in regular moderate exercises at least 150 minutes weekly Diabetic foot exam completed, education for diabetic footcare completed Blood pressure well-controlled lisinopril 2.5 mg daily, metoprolol 25 mg daily Follow-up in 2 months      Relevant Medications   rosuvastatin (CRESTOR) 20 MG tablet   JARDIANCE 25 MG TABS tablet   lisinopril (ZESTRIL) 2.5 MG tablet     Other   Mixed hyperlipidemia    05/05/2022 05/06/2022 LIPID PANEL, STANDARD non HDL cholesterol 61  LDL  well-controlled Continue Crestor 20 mg daily      Relevant Medications   rosuvastatin (CRESTOR) 20 MG tablet   lisinopril (ZESTRIL) 2.5 MG tablet   Screening for colon cancer   Relevant Orders   Cologuard    Meds ordered this encounter  Medications   rosuvastatin (CRESTOR) 20 MG tablet    Sig: Take 1 tablet (20 mg total) by mouth daily.    Dispense:  90 tablet    Refill:  1    Pt must keep upcoming appt in June 2024 with new Cardiologist Dr. Izora Ribas before anymore refills. Thank you Final Attempt   JARDIANCE 25 MG TABS tablet    Sig: Take 1 tablet (25 mg total) by mouth daily.    Dispense:  90 tablet    Refill:  1   lisinopril (ZESTRIL) 2.5 MG tablet    Sig: Take 1 tablet (2.5 mg total) by mouth daily.    Dispense:  90 tablet    Refill:  1    Follow-up: Return in about 2 months (around 12/04/2022) for DM, HTN.    Donell Beers, FNP

## 2022-10-04 NOTE — Patient Instructions (Signed)
Goal for fasting blood sugar ranges from 80 to 120 and 2 hours after any meal or at bedtime should be between 130 to 170.   It is important that you exercise regularly at least 30 minutes 5 times a week as tolerated  Think about what you will eat, plan ahead. Choose " clean, green, fresh or frozen" over canned, processed or packaged foods which are more sugary, salty and fatty. 70 to 75% of food eaten should be vegetables and fruit. Three meals at set times with snacks allowed between meals, but they must be fruit or vegetables. Aim to eat over a 12 hour period , example 7 am to 7 pm, and STOP after  your last meal of the day. Drink water,generally about 64 ounces per day, no other drink is as healthy. Fruit juice is best enjoyed in a healthy way, by EATING the fruit.  Thanks for choosing [facility] we consider it a privelige to serve you.

## 2022-10-04 NOTE — Assessment & Plan Note (Signed)
05/05/2022 05/06/2022 LIPID PANEL, STANDARD non HDL cholesterol 61  LDL well-controlled Continue Crestor 20 mg daily

## 2022-10-06 ENCOUNTER — Telehealth: Payer: Self-pay | Admitting: Pharmacist

## 2022-10-06 ENCOUNTER — Other Ambulatory Visit: Payer: 59 | Admitting: Pharmacist

## 2022-10-06 NOTE — Progress Notes (Signed)
Attempted to contact patient for scheduled appointment for medication management. Left HIPAA compliant message for patient to return my call at their convenience.   Catie T. Harper, PharmD, BCACP, CPP Clinical Pharmacist [facility] [phone]  

## 2022-10-07 ENCOUNTER — Other Ambulatory Visit: Payer: 59 | Admitting: Pharmacist

## 2022-10-10 ENCOUNTER — Telehealth: Payer: Self-pay

## 2022-10-10 NOTE — Progress Notes (Signed)
Care Coordination Note  10/10/2022 Name: Juan Schwartz MRN: [id number] DOB: [birth year]  Juan Schwartz is a 57 y.o. year old male who is a primary care patient of Donell Beers, FNP and is actively engaged with the Chronic Care Management team. I reached out to [facility] by phone today to assist with re-scheduling an initial visit with the Pharmacist  Follow up plan: Unsuccessful telephone outreach attempt made. A HIPAA compliant phone message was left for the patient providing contact information and requesting a return call.  If patient returns call to provider office, please advise to call CCM Care Guide Penne Lash  at [phone]  Penne Lash, RMA Care Guide [facility]  [place], [place] [address] Direct Dial: [phone] [email or web address]

## 2022-10-11 ENCOUNTER — Ambulatory Visit: Payer: 59 | Admitting: Nurse Practitioner

## 2022-10-14 NOTE — Progress Notes (Signed)
Care Coordination Note  10/14/2022 Name: Juan Schwartz MRN: [id number] DOB: 06/14/1965  Juan Schwartz is a 57 y.o. year old male who is a primary care patient of Juan Beers, FNP and is actively engaged with the Chronic Care Management team. I reached out to [facility] by phone today to assist with re-scheduling an initial visit with the Pharmacist  Follow up plan: Patient declines further follow up and engagement by the care management team. Appropriate care team members and provider have been notified via electronic communication.   Penne Lash, RMA Care Guide [facility]  [place], [place] [address] Direct Dial: [phone] [email or web address]

## 2022-10-18 ENCOUNTER — Other Ambulatory Visit: Payer: Self-pay | Admitting: Nurse Practitioner

## 2022-10-28 ENCOUNTER — Other Ambulatory Visit: Payer: Self-pay | Admitting: Nurse Practitioner

## 2022-10-28 DIAGNOSIS — Z1211 Encounter for screening for malignant neoplasm of colon: Secondary | ICD-10-CM

## 2022-10-28 DIAGNOSIS — Z1212 Encounter for screening for malignant neoplasm of rectum: Secondary | ICD-10-CM

## 2022-12-05 ENCOUNTER — Ambulatory Visit: Payer: 59 | Admitting: Dietician

## 2022-12-06 ENCOUNTER — Ambulatory Visit: Payer: Self-pay | Admitting: Nurse Practitioner

## 2022-12-12 ENCOUNTER — Ambulatory Visit: Payer: 59 | Admitting: Nurse Practitioner

## 2022-12-12 ENCOUNTER — Encounter: Payer: Self-pay | Admitting: Nurse Practitioner

## 2022-12-12 VITALS — BP 106/69 | HR 85 | Wt 284.0 lb

## 2022-12-12 DIAGNOSIS — I10 Essential (primary) hypertension: Secondary | ICD-10-CM | POA: Diagnosis not present

## 2022-12-12 DIAGNOSIS — E119 Type 2 diabetes mellitus without complications: Secondary | ICD-10-CM | POA: Diagnosis not present

## 2022-12-12 DIAGNOSIS — Z1211 Encounter for screening for malignant neoplasm of colon: Secondary | ICD-10-CM | POA: Diagnosis not present

## 2022-12-12 DIAGNOSIS — E785 Hyperlipidemia, unspecified: Secondary | ICD-10-CM

## 2022-12-12 DIAGNOSIS — T7840XA Allergy, unspecified, initial encounter: Secondary | ICD-10-CM | POA: Insufficient documentation

## 2022-12-12 LAB — POCT GLYCOSYLATED HEMOGLOBIN (HGB A1C): Hemoglobin A1C: 9.4 % — AB (ref 4.0–5.6)

## 2022-12-12 MED ORDER — SEMAGLUTIDE(0.25 OR 0.5MG/DOS) 2 MG/3ML ~~LOC~~ SOPN
0.2500 mg | PEN_INJECTOR | SUBCUTANEOUS | 0 refills | Status: DC
Start: 2022-12-12 — End: 2023-03-29

## 2022-12-12 MED ORDER — GLIPIZIDE 5 MG PO TABS: 5.0000 mg | ORAL_TABLET | Freq: Two times a day (BID) | ORAL | 3 refills | Status: DC

## 2022-12-12 NOTE — Progress Notes (Signed)
Established Patient Office Visit  Subjective:  Patient ID: Juan Schwartz, male    DOB: April 26, 1965  Age: 57 y.o. MRN: [id number]  CC:  Chief Complaint  Patient presents with   Diabetes    Follow up    HPI [facility] Juan Schwartz is a 57 y.o. male  has a past medical history of Allergy, Atrial fibrillation (HCC) (08/04/2015), Dehydration (08/04/2015), Diabetes mellitus, type 2 (HCC) (11/29/2011), Essential hypertension (08/04/2015), Hyperlipidemia, Hypertension, Mild sleep apnea (2017), NICM (nonischemic cardiomyopathy) (HCC), Normal coronary arteries (2004), and Obesity (11/29/2011).  Patient presents for follow-up for his chronic medical conditions Patient stated that he has been taking all medications daily as ordered Cologuard ordered to screen for colon cancer  Type 2 diabetes.  Currently on metformin 1000 mg twice daily, Jardiance 25 mg daily, glipizide 10 mg twice daily.  States that he does walking exercises on some days, he has been avoiding sweets, soda, juices, also eating less carbohydrate rich foods.  No complaints of polyuria polydipsia polyphagia.  He was referred to the clinical pharmacist at his last visit but the patient did not follow-up with them.  Stated that he is up-to-date with his diabetic eye exam.  Hypertension.  Currently on lisinopril 2.5 mg daily metoprolol 25 mg twice daily.  Patient denies chest pain shortness of breath edema  Patient declined flu vaccine Tdap vaccine shingles vaccine.  Past Medical History:  Diagnosis Date   Allergy    Atrial fibrillation (HCC) 08/04/2015   CHA2DS2-VASc = 3   Dehydration 08/04/2015   Diabetes mellitus, type 2 (HCC) 11/29/2011   Essential hypertension 08/04/2015   Hyperlipidemia    Hypertension    Mild sleep apnea 2017   NICM (nonischemic cardiomyopathy) (HCC)    Normal coronary arteries 2004   Obesity 11/29/2011    Past Surgical History:  Procedure Laterality Date   CARDIAC CATHETERIZATION  2005    CARDIOVERSION N/A 08/05/2015   Procedure: CARDIOVERSION;  Surgeon: Jake Bathe, MD;  Location: [facility] ENDOSCOPY;  Service: Cardiovascular;  Laterality: N/A;   CARDIOVERSION N/A 07/19/2017   Procedure: CARDIOVERSION;  Surgeon: Vesta Mixer, MD;  Location: [facility] ENDOSCOPY;  Service: Cardiovascular;  Laterality: N/A;   CARDIOVERSION N/A 03/25/2019   Procedure: CARDIOVERSION;  Surgeon: Jake Bathe, MD;  Location: [facility] ENDOSCOPY;  Service: Cardiovascular;  Laterality: N/A;   HERNIA REPAIR     TEE WITHOUT CARDIOVERSION N/A 08/05/2015   Procedure: TRANSESOPHAGEAL ECHOCARDIOGRAM (TEE);  Surgeon: Jake Bathe, MD;  Location: [facility] ENDOSCOPY;  Service: Cardiovascular;  Laterality: N/A;    Family History  Problem Relation Age of Onset   Diabetes Mother     Social History   Socioeconomic History   Marital status: Married    Spouse name: Not on file   Number of children: Not on file   Years of education: Not on file   Highest education level: Not on file  Occupational History   Not on file  Tobacco Use   Smoking status: Former    Passive exposure: Past   Smokeless tobacco: Never  Vaping Use   Vaping status: Never Used  Substance and Sexual Activity   Alcohol use: Yes    Alcohol/week: 4.0 standard drinks of alcohol    Types: 2 Glasses of wine, 2 Cans of beer per week    Comment: occ   Drug use: No   Sexual activity: Yes  Other Topics Concern   Not on file  Social History Narrative   Lives in [place]  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   Social Determinants of Health   Financial Resource Strain: Low Risk  (08/31/2022)   Overall Financial Resource Strain (CARDIA)    Difficulty of Paying Living Expenses: Not very hard  Food Insecurity: No Food Insecurity (08/31/2022)   Hunger Vital Sign    Worried About Running Out of Food in the Last Year: Never true    Ran Out of Food in the Last Year: Never true  Transportation Needs: No Transportation Needs (08/31/2022)   PRAPARE -  Administrator, Civil Service (Medical): No    Lack of Transportation (Non-Medical): No  Physical Activity: Unknown (08/31/2022)   Exercise Vital Sign    Days of Exercise per Week: 0 days    Minutes of Exercise per Session: Not on file  Stress: No Stress Concern Present (08/31/2022)   [employer] of Occupational Health - Occupational Stress Questionnaire    Feeling of Stress : Only a little  Social Connections: Socially Isolated (08/31/2022)   Social Connection and Isolation Panel [NHANES]    Frequency of Communication with Friends and Family: Twice a week    Frequency of Social Gatherings with Friends and Family: Never    Attends Religious Services: Never    Database administrator or Organizations: No    Attends Engineer, structural: Not on file    Marital Status: Married  Catering manager Violence: Not on file    Outpatient Medications Prior to Visit  Medication Sig Dispense Refill   acetaminophen (TYLENOL) 650 MG CR tablet Take 650 mg by mouth every 8 (eight) hours as needed for pain.     apixaban (ELIQUIS) 5 MG TABS tablet Take 1 tablet by mouth twice daily 180 tablet 1   Blood Glucose Monitoring Suppl (ONE TOUCH ULTRA MINI) w/Device KIT Use to check blood glucose 2x daily 1 kit 0   flecainide (TAMBOCOR) 50 MG tablet Take 1 tablet (50 mg total) by mouth 2 (two) times daily. 180 tablet 3   JARDIANCE 25 MG TABS tablet Take 1 tablet (25 mg total) by mouth daily. 90 tablet 1   lisinopril (ZESTRIL) 2.5 MG tablet Take 1 tablet (2.5 mg total) by mouth daily. 90 tablet 1   metFORMIN (GLUCOPHAGE-XR) 500 MG 24 hr tablet Take 2 tablets (1,000 mg total) by mouth daily with breakfast. 120 tablet 3   metoprolol succinate (TOPROL-XL) 25 MG 24 hr tablet TAKE 1 TABLET BY MOUTH TWICE DAILY .  MAY  TAKE  AN  EXTRA  TABLET  AS  NEEDED  FOR  BREAKTHROUGH. 225 tablet 3   ONETOUCH VERIO test strip USE TO CHECK BLOOD GLUCOSE TWICE DAILY 100 each 0   rosuvastatin (CRESTOR) 20 MG tablet  Take 1 tablet (20 mg total) by mouth daily. 90 tablet 1   sildenafil (VIAGRA) 100 MG tablet Take 1 tablet by mouth as needed.     glipiZIDE (GLUCOTROL) 10 MG tablet Take 1 tablet (10 mg total) by mouth 2 (two) times daily before a meal. 60 tablet 3   meloxicam (MOBIC) 15 MG tablet Take 15 mg by mouth as needed for pain. (Patient not taking: Reported on 09/06/2022)     No facility-administered medications prior to visit.    No Known Allergies  ROS Review of Systems  Constitutional:  Negative for appetite change, chills, fatigue and fever.  HENT:  Negative for congestion, postnasal drip, rhinorrhea and sneezing.   Respiratory:  Negative for cough, shortness of breath and  wheezing.   Cardiovascular:  Negative for chest pain, palpitations and leg swelling.  Gastrointestinal:  Negative for abdominal pain, constipation, nausea and vomiting.  Genitourinary:  Negative for difficulty urinating, dysuria, flank pain and frequency.  Musculoskeletal:  Negative for arthralgias, back pain, joint swelling and myalgias.  Skin:  Negative for color change, pallor, rash and wound.  Neurological:  Negative for dizziness, facial asymmetry, weakness, numbness and headaches.  Psychiatric/Behavioral:  Negative for behavioral problems, confusion, self-injury and suicidal ideas.       Objective:    Physical Exam Vitals and nursing note reviewed.  Constitutional:      General: He is not in acute distress.    Appearance: Normal appearance. He is obese. He is not ill-appearing, toxic-appearing or diaphoretic.  HENT:     Mouth/Throat:     Mouth: Mucous membranes are moist.     Pharynx: Oropharynx is clear. No oropharyngeal exudate or posterior oropharyngeal erythema.  Eyes:     General: No scleral icterus.       Right eye: No discharge.        Left eye: No discharge.     Extraocular Movements: Extraocular movements intact.     Conjunctiva/sclera: Conjunctivae normal.  Cardiovascular:     Rate and Rhythm:  Normal rate and regular rhythm.     Pulses: Normal pulses.     Heart sounds: Normal heart sounds. No murmur heard.    No friction rub. No gallop.  Pulmonary:     Effort: Pulmonary effort is normal. No respiratory distress.     Breath sounds: Normal breath sounds. No stridor. No wheezing, rhonchi or rales.  Chest:     Chest wall: No tenderness.  Abdominal:     General: There is no distension.     Palpations: Abdomen is soft.     Tenderness: There is no abdominal tenderness. There is no right CVA tenderness, left CVA tenderness or guarding.  Musculoskeletal:        General: No swelling, tenderness, deformity or signs of injury.     Right lower leg: No edema.     Left lower leg: No edema.  Skin:    General: Skin is warm and dry.     Capillary Refill: Capillary refill takes less than 2 seconds.     Coloration: Skin is not jaundiced or pale.     Findings: No bruising, erythema or lesion.  Neurological:     Mental Status: He is alert and oriented to person, place, and time.     Motor: No weakness.     Coordination: Coordination normal.     Gait: Gait normal.  Psychiatric:        Mood and Affect: Mood normal.        Behavior: Behavior normal.        Thought Content: Thought content normal.        Judgment: Judgment normal.     BP 106/69   Pulse 85   Wt 284 lb (128.8 kg)   SpO2 99%   BMI 44.48 kg/m  Wt Readings from Last 3 Encounters:  12/12/22 284 lb (128.8 kg)  10/04/22 280 lb (127 kg)  09/06/22 280 lb (127 kg)    Lab Results  Component Value Date   TSH 2.369 08/04/2015   Lab Results  Component Value Date   WBC 6.9 08/30/2022   HGB 12.6 (L) 08/30/2022   HCT 38.7 (L) 08/30/2022   MCV 94.4 08/30/2022   PLT 151 08/30/2022   Lab Results  Component Value Date   NA 136 08/30/2022   K 4.6 08/30/2022   CO2 24 08/30/2022   GLUCOSE 175 (H) 08/30/2022   BUN 22 (H) 08/30/2022   CREATININE 0.75 08/30/2022   BILITOT 0.9 08/30/2022   ALKPHOS 70 08/30/2022   AST 29  08/30/2022   ALT 29 08/30/2022   PROT 7.2 08/30/2022   ALBUMIN 3.8 08/30/2022   CALCIUM 9.7 08/30/2022   ANIONGAP 14 08/30/2022   EGFR 107 05/10/2021   Lab Results  Component Value Date   CHOL 128 08/10/2021   Lab Results  Component Value Date   HDL 54 08/10/2021   Lab Results  Component Value Date   LDLCALC 54 08/10/2021   Lab Results  Component Value Date   TRIG 112 08/10/2021   Lab Results  Component Value Date   CHOLHDL 2.4 08/10/2021   Lab Results  Component Value Date   HGBA1C 9.4 (A) 12/12/2022      Assessment & Plan:   Problem List Items Addressed This Visit       Cardiovascular and Mediastinum   Diabetes mellitus with coincident hypertension (HCC) - Primary    A1c much improved but not at goal of less than 7 Patient denies personal or family history of M EN 2, MTC, no history of pancreatitis Start Ozempic 0.25 once weekly injection patient encouraged to avoid fatty fried food, eat smaller portion of meals while on medication to help decrease nausea, encouraged to report abdominal pain nausea vomiting Decrease glipizide to 5 mg twice daily Continue Jardiance 25 mg daily, metformin 1000 mg twice daily Patient counseled on low-carb modified diets Encouraged engage in regular moderate exercise at least 150 minutes weekly Patient referred to the clinical pharmacist Follow-up in 4 weeks   BP Readings from Last 3 Encounters:  12/12/22 106/69  10/04/22 100/70  09/06/22 106/60    Blood pressure remains well-controlled on lisinopril 2.5 mg daily, metoprolol 25 mg daily, continue current medications DASH diet advised      Relevant Medications   Semaglutide,0.25 or 0.5MG /DOS, 2 MG/3ML SOPN   glipiZIDE (GLUCOTROL) 5 MG tablet   Other Relevant Orders   POCT glycosylated hemoglobin (Hb A1C) (Completed)   AMB Referral [facility] Care Management     Other   Dyslipidemia    Lab Results  Component Value Date   CHOL 128 08/10/2021   HDL 54 08/10/2021   LDLCALC  54 08/10/2021   TRIG 112 08/10/2021   CHOLHDL 2.4 08/10/2021  Well-controlled on Crestor 20 mg daily Continue current medication Avoid fatty fried foods      Screening for colon cancer   Relevant Orders   Cologuard   Allergies    Patient complains of itchy throat, no fever, sneezing, nasal drainage Uses Flonase nasal spray at home. Patient encouraged to take OTC Claritin 10 mg daily Continue Flonase nasal spray        Meds ordered this encounter  Medications   Semaglutide,0.25 or 0.5MG /DOS, 2 MG/3ML SOPN    Sig: Inject 0.25 mg into the skin once a week.    Dispense:  3 mL    Refill:  0   glipiZIDE (GLUCOTROL) 5 MG tablet    Sig: Take 1 tablet (5 mg total) by mouth 2 (two) times daily before a meal.    Dispense:  60 tablet    Refill:  3    Follow-up: Return in about 4 weeks (around 01/09/2023) for DM.    Donell Beers, FNP

## 2022-12-12 NOTE — Assessment & Plan Note (Signed)
Lab Results  Component Value Date   CHOL 128 08/10/2021   HDL 54 08/10/2021   LDLCALC 54 08/10/2021   TRIG 112 08/10/2021   CHOLHDL 2.4 08/10/2021  Well-controlled on Crestor 20 mg daily Continue current medication Avoid fatty fried foods

## 2022-12-12 NOTE — Patient Instructions (Signed)
Please continue metformin 1000 mg twice daily, Jardiance 25 mg daily Start Ozempic 0.25 mg once weekly injection Decrease glipizide to 5 mg twice daily  Goal for fasting blood sugar ranges from 80 to 120 and 2 hours after any meal or at bedtime should be between 130 to 170.    It is important that you exercise regularly at least 30 minutes 5 times a week as tolerated  Think about what you will eat, plan ahead. Choose " clean, green, fresh or frozen" over canned, processed or packaged foods which are more sugary, salty and fatty. 70 to 75% of food eaten should be vegetables and fruit. Three meals at set times with snacks allowed between meals, but they must be fruit or vegetables. Aim to eat over a 12 hour period , example 7 am to 7 pm, and STOP after  your last meal of the day. Drink water,generally about 64 ounces per day, no other drink is as healthy. Fruit juice is best enjoyed in a healthy way, by EATING the fruit.  Thanks for choosing [facility] we consider it a privelige to serve you.

## 2022-12-12 NOTE — Assessment & Plan Note (Signed)
Patient complains of itchy throat, no fever, sneezing, nasal drainage Uses Flonase nasal spray at home. Patient encouraged to take OTC Claritin 10 mg daily Continue Flonase nasal spray

## 2022-12-12 NOTE — Assessment & Plan Note (Addendum)
A1c much improved but not at goal of less than 7 Patient denies personal or family history of M EN 2, MTC, no history of pancreatitis Start Ozempic 0.25 once weekly injection patient encouraged to avoid fatty fried food, eat smaller portion of meals while on medication to help decrease nausea, encouraged to report abdominal pain nausea vomiting Decrease glipizide to 5 mg twice daily Continue Jardiance 25 mg daily, metformin 1000 mg twice daily Patient counseled on low-carb modified diets Encouraged engage in regular moderate exercise at least 150 minutes weekly Patient referred to the clinical pharmacist Follow-up in 4 weeks   BP Readings from Last 3 Encounters:  12/12/22 106/69  10/04/22 100/70  09/06/22 106/60    Blood pressure remains well-controlled on lisinopril 2.5 mg daily, metoprolol 25 mg daily, continue current medications DASH diet advised

## 2022-12-13 ENCOUNTER — Telehealth: Payer: Self-pay

## 2022-12-13 NOTE — Progress Notes (Signed)
   Care Guide Note  12/13/2022 Name: Juan Schwartz MRN: [id number] DOB: [birth year]  Referred by: Donell Beers, FNP Reason for referral : Care Coordination (Outreach to schedule with Pharm d)   Juan Schwartz is a 57 y.o. year old male who is a primary care patient of Donell Beers, FNP. Juan Schwartz was referred to the pharmacist for assistance related to DM.    An unsuccessful telephone outreach was attempted today to contact the patient who was referred to the pharmacy team for assistance with medication management. Additional attempts will be made to contact the patient.   Penne Lash , RMA     [facility]  [facility], [facility] Guide  Direct Dial: [phone]  Website: [email or web address]

## 2022-12-13 NOTE — Progress Notes (Signed)
   Care Guide Note  12/13/2022 Name: DUSAN LEFF MRN: [id number] DOB: 12/17/1965  Referred by: Donell Beers, FNP Reason for referral : Care Coordination (Outreach to schedule with Pharm d)   Keyontay Wambach Fausey is a 57 y.o. year old male who is a primary care patient of Donell Beers, FNP. Claudy Judie Petit Lisenby was referred to the pharmacist for assistance related to DM.    Successful contact was made with the patient to discuss pharmacy services including being ready for the pharmacist to call at least 5 minutes before the scheduled appointment time, to have medication bottles and any blood sugar or blood pressure readings ready for review. The patient agreed to meet with the pharmacist via with the pharmacist via telephone visit on (date/time).  12/18/2022  Penne Lash , RMA     [place]  [facility], [facility] Guide  Direct Dial: [phone]  Website: [email or web address]

## 2022-12-18 ENCOUNTER — Other Ambulatory Visit: Payer: Self-pay

## 2022-12-18 NOTE — Progress Notes (Unsigned)
   12/18/2022 Name: ERICK AYE MRN: [id number] DOB: 1965-08-22  No chief complaint on file.   Kymari Judie Petit Leal is a 57 y.o. year old male who presented for a telephone visit.   They were referred to the pharmacist by their PCP for assistance in managing diabetes.    Subjective:  Care Team: Primary Care Provider: Donell Beers, FNP ; Next Scheduled Visit: 01/09/23  Medication Access/Adherence  Current Pharmacy:  [facility] [address] ([address]), [place] - [address] [place] ([place]) [place] [address] Phone: [phone] Fax: [phone]   Patient reports affordability concerns with their medications: {YES/NO:21197} Patient reports access/transportation concerns to their pharmacy: {YES/NO:21197} Patient reports adherence concerns with their medications:  {YES/NO:21197} ***   Diabetes:  Current medications: metformin 1000 mg twice daily, Jardiance 25 mg daily, glipizide 5 mg twice daily, Ozempic 0.25 mg weekly  Current glucose readings: *** Using *** meter; testing *** times daily  Patient {Actions; denies-reports:120008} hypoglycemic s/sx including ***dizziness, shakiness, sweating. Patient {Actions; denies-reports:120008} hyperglycemic symptoms including ***polyuria, polydipsia, polyphagia, nocturia, neuropathy, blurred vision.  Current meal patterns:  - Breakfast: *** - Lunch *** - Supper *** - Snacks *** - Drinks ***  Current physical activity: ***  Current medication access support: ***   Objective:  Lab Results  Component Value Date   HGBA1C 9.4 (A) 12/12/2022    Lab Results  Component Value Date   CREATININE 0.75 08/30/2022   BUN 22 (H) 08/30/2022   NA 136 08/30/2022   K 4.6 08/30/2022   CL 98 08/30/2022   CO2 24 08/30/2022    Lab Results  Component Value Date   CHOL 128 08/10/2021   HDL 54 08/10/2021   LDLCALC 54 08/10/2021   TRIG 112 08/10/2021   CHOLHDL 2.4 08/10/2021    Medications Reviewed  Today   Medications were not reviewed in this encounter       Assessment/Plan:   {Pharmacy A/P Choices:26421}  Follow Up Plan: ***   Jarrett Ables, PharmD PGY-1 Pharmacy Resident

## 2022-12-25 ENCOUNTER — Other Ambulatory Visit: Payer: Self-pay | Admitting: Nurse Practitioner

## 2022-12-25 DIAGNOSIS — E119 Type 2 diabetes mellitus without complications: Secondary | ICD-10-CM

## 2022-12-26 ENCOUNTER — Other Ambulatory Visit (HOSPITAL_COMMUNITY): Payer: Self-pay | Admitting: Nurse Practitioner

## 2022-12-26 DIAGNOSIS — E119 Type 2 diabetes mellitus without complications: Secondary | ICD-10-CM

## 2022-12-26 MED ORDER — GLIPIZIDE 5 MG PO TABS
5.0000 mg | ORAL_TABLET | Freq: Two times a day (BID) | ORAL | 3 refills | Status: DC
Start: 2022-12-26 — End: 2023-03-29

## 2022-12-29 ENCOUNTER — Telehealth: Payer: Self-pay

## 2022-12-29 NOTE — Progress Notes (Signed)
   Care Guide Note  12/29/2022 Name: Juan Schwartz MRN: [id number] DOB: 28-Mar-1965  Referred by: Donell Beers, FNP Reason for referral : Care Coordination (Outreach to reschedule with pharm d )   Juan Schwartz is a 57 y.o. year old male who is a primary care patient of Donell Beers, FNP. Juan Schwartz was referred to the pharmacist for assistance related to DM.    Successful contact was made with the patient to discuss pharmacy services. Patient declines engagement at this time. Contact information was provided to the patient should they wish to reach out for assistance at a later time.  Penne Lash , RMA     [facility]  [facility], [facility] Guide  Direct Dial: [phone]  Website: [email or web address]

## 2023-01-02 LAB — HM DIABETES EYE EXAM

## 2023-01-09 ENCOUNTER — Encounter: Payer: Self-pay | Admitting: Nurse Practitioner

## 2023-01-09 ENCOUNTER — Ambulatory Visit: Payer: 59 | Admitting: Nurse Practitioner

## 2023-01-09 VITALS — BP 121/67 | HR 85 | Temp 97.0°F | Wt 291.0 lb

## 2023-01-09 DIAGNOSIS — Z1211 Encounter for screening for malignant neoplasm of colon: Secondary | ICD-10-CM

## 2023-01-09 DIAGNOSIS — E785 Hyperlipidemia, unspecified: Secondary | ICD-10-CM

## 2023-01-09 DIAGNOSIS — E119 Type 2 diabetes mellitus without complications: Secondary | ICD-10-CM | POA: Diagnosis not present

## 2023-01-09 DIAGNOSIS — I480 Paroxysmal atrial fibrillation: Secondary | ICD-10-CM

## 2023-01-09 DIAGNOSIS — I10 Essential (primary) hypertension: Secondary | ICD-10-CM

## 2023-01-09 DIAGNOSIS — I509 Heart failure, unspecified: Secondary | ICD-10-CM

## 2023-01-09 MED ORDER — OZEMPIC (0.25 OR 0.5 MG/DOSE) 2 MG/3ML ~~LOC~~ SOPN
0.5000 mg | PEN_INJECTOR | SUBCUTANEOUS | 2 refills | Status: DC
Start: 2023-01-16 — End: 2023-03-29

## 2023-01-09 NOTE — Assessment & Plan Note (Signed)
Well-controlled on Crestor 20 mg daily Continue current medication

## 2023-01-09 NOTE — Assessment & Plan Note (Signed)
Continue Jardiance 25 mg daily, metoprolol 25 mg daily

## 2023-01-09 NOTE — Assessment & Plan Note (Signed)
Continue flecainide 50 mg twice daily, Eliquis 5 mg twice daily, metoprolol 25 mg daily

## 2023-01-09 NOTE — Assessment & Plan Note (Signed)
Continue Ozempic 0.25 mg once weekly injection, increase to 0.5 mg once completed Continue Jardiance 25 mg daily, metformin 1000 mg twice daily glipizide 5 mg twice daily CBG goals discussed patient encouraged to call the office with blood sugar readings of less than 70 Patient counseled on low-carb modified diet Encouraged engage in regular moderate exercise at least 150 minutes weekly Up-to-date with diabetic eye exam records requested  Blood pressure is well-controlled on metoprolol 25 mg daily, lisinopril 2.5 mg daily HTN Controlled . Continue current medications. No changes in management. Discussed DASH diet and dietary sodium restrictions Continue to increase dietary efforts and exercise.

## 2023-01-09 NOTE — Progress Notes (Signed)
Established Patient Office Visit  Subjective:  Patient ID: Juan Schwartz, male    DOB: 02-28-1965  Age: 57 y.o. MRN: [id number]  CC:  Chief Complaint  Patient presents with   Diabetes    Follow up new medication    HPI Juan Schwartz is a 57 y.o. male  has a past medical history of Allergy, Atrial fibrillation (HCC) (08/04/2015), Dehydration (08/04/2015), Diabetes mellitus, type 2 (HCC) (11/29/2011), Essential hypertension (08/04/2015), Hyperlipidemia, Hypertension, Mild sleep apnea (2017), NICM (nonischemic cardiomyopathy) (HCC), Normal coronary arteries (2004), and Obesity (11/29/2011).  Patient present for follow-up for type 2 diabetes   Type 2 diabetes.  Currently on glipizide 5 mg twice daily, Jardiance 25 mg daily, metformin 1000 mg twice daily, has had 2 doses of Ozempic 0.25 mg once weekly injection.  Patient denies hypoglycemia, no  complains of polyphagia polyuria polydipsia.  He was referred to the clinical pharmacist for medication management but he prefers not to see them for now  Stated that he has not received the Cologuard testing kit, test reordered, the nurse will follow-up on this with the company as well.  Patient denies any adverse reactions to current medications        Past Medical History:  Diagnosis Date   Allergy    Atrial fibrillation (HCC) 08/04/2015   CHA2DS2-VASc = 3   Dehydration 08/04/2015   Diabetes mellitus, type 2 (HCC) 11/29/2011   Essential hypertension 08/04/2015   Hyperlipidemia    Hypertension    Mild sleep apnea 2017   NICM (nonischemic cardiomyopathy) (HCC)    Normal coronary arteries 2004   Obesity 11/29/2011    Past Surgical History:  Procedure Laterality Date   CARDIAC CATHETERIZATION  2005   CARDIOVERSION N/A 08/05/2015   Procedure: CARDIOVERSION;  Surgeon: Jake Bathe, MD;  Location: [facility] ENDOSCOPY;  Service: Cardiovascular;  Laterality: N/A;   CARDIOVERSION N/A 07/19/2017   Procedure: CARDIOVERSION;   Surgeon: Vesta Mixer, MD;  Location: [facility] ENDOSCOPY;  Service: Cardiovascular;  Laterality: N/A;   CARDIOVERSION N/A 03/25/2019   Procedure: CARDIOVERSION;  Surgeon: Jake Bathe, MD;  Location: [facility] ENDOSCOPY;  Service: Cardiovascular;  Laterality: N/A;   HERNIA REPAIR     TEE WITHOUT CARDIOVERSION N/A 08/05/2015   Procedure: TRANSESOPHAGEAL ECHOCARDIOGRAM (TEE);  Surgeon: Jake Bathe, MD;  Location: [facility] ENDOSCOPY;  Service: Cardiovascular;  Laterality: N/A;    Family History  Problem Relation Age of Onset   Diabetes Mother     Social History   Socioeconomic History   Marital status: Married    Spouse name: Not on file   Number of children: Not on file   Years of education: Not on file   Highest education level: Not on file  Occupational History   Not on file  Tobacco Use   Smoking status: Former    Passive exposure: Past   Smokeless tobacco: Never  Vaping Use   Vaping status: Never Used  Substance and Sexual Activity   Alcohol use: Yes    Alcohol/week: 4.0 standard drinks of alcohol    Types: 2 Glasses of wine, 2 Cans of beer per week    Comment: occ   Drug use: No   Sexual activity: Yes  Other Topics Concern   Not on file  Social History Narrative   Lives in [place]   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   Social Drivers of Corporate investment banker Strain: Low Risk  (08/31/2022)   Overall Physicist, medical Strain (  CARDIA)    Difficulty of Paying Living Expenses: Not very hard  Food Insecurity: No Food Insecurity (08/31/2022)   Hunger Vital Sign    Worried About Running Out of Food in the Last Year: Never true    Ran Out of Food in the Last Year: Never true  Transportation Needs: No Transportation Needs (08/31/2022)   PRAPARE - Administrator, Civil Service (Medical): No    Lack of Transportation (Non-Medical): No  Physical Activity: Unknown (08/31/2022)   Exercise Vital Sign    Days of Exercise per Week: 0 days    Minutes of  Exercise per Session: Not on file  Stress: No Stress Concern Present (08/31/2022)   [employer] of Occupational Health - Occupational Stress Questionnaire    Feeling of Stress : Only a little  Social Connections: Socially Isolated (08/31/2022)   Social Connection and Isolation Panel [NHANES]    Frequency of Communication with Friends and Family: Twice a week    Frequency of Social Gatherings with Friends and Family: Never    Attends Religious Services: Never    Database administrator or Organizations: No    Attends Engineer, structural: Not on file    Marital Status: Married  Catering manager Violence: Not on file    Outpatient Medications Prior to Visit  Medication Sig Dispense Refill   acetaminophen (TYLENOL) 650 MG CR tablet Take 650 mg by mouth every 8 (eight) hours as needed for pain.     apixaban (ELIQUIS) 5 MG TABS tablet Take 1 tablet by mouth twice daily 180 tablet 1   Blood Glucose Monitoring Suppl (ONE TOUCH ULTRA MINI) w/Device KIT Use to check blood glucose 2x daily 1 kit 0   flecainide (TAMBOCOR) 50 MG tablet Take 1 tablet (50 mg total) by mouth 2 (two) times daily. 180 tablet 3   glipiZIDE (GLUCOTROL) 5 MG tablet Take 1 tablet (5 mg total) by mouth 2 (two) times daily before a meal. 60 tablet 3   JARDIANCE 25 MG TABS tablet Take 1 tablet (25 mg total) by mouth daily. 90 tablet 1   lisinopril (ZESTRIL) 2.5 MG tablet Take 1 tablet (2.5 mg total) by mouth daily. 90 tablet 1   metFORMIN (GLUCOPHAGE-XR) 500 MG 24 hr tablet Take 2 tablets (1,000 mg total) by mouth daily with breakfast. 120 tablet 3   metoprolol succinate (TOPROL-XL) 25 MG 24 hr tablet TAKE 1 TABLET BY MOUTH TWICE DAILY .  MAY  TAKE  AN  EXTRA  TABLET  AS  NEEDED  FOR  BREAKTHROUGH. 225 tablet 3   rosuvastatin (CRESTOR) 20 MG tablet Take 1 tablet (20 mg total) by mouth daily. 90 tablet 1   Semaglutide,0.25 or 0.5MG /DOS, 2 MG/3ML SOPN Inject 0.25 mg into the skin once a week. 3 mL 0   sildenafil (VIAGRA)  100 MG tablet Take 1 tablet by mouth as needed.     meloxicam (MOBIC) 15 MG tablet Take 15 mg by mouth as needed for pain. (Patient not taking: Reported on 01/09/2023)     ONETOUCH VERIO test strip USE TO CHECK BLOOD GLUCOSE TWICE DAILY (Patient not taking: Reported on 01/09/2023) 100 each 0   No facility-administered medications prior to visit.    No Known Allergies  ROS Review of Systems  Constitutional:  Negative for appetite change, chills, fatigue and fever.  HENT:  Negative for congestion, postnasal drip, rhinorrhea and sneezing.   Respiratory:  Negative for cough, shortness of breath and wheezing.  Cardiovascular:  Negative for chest pain, palpitations and leg swelling.  Gastrointestinal:  Negative for abdominal pain, constipation, nausea and vomiting.  Genitourinary:  Negative for difficulty urinating, dysuria, flank pain and frequency.  Musculoskeletal:  Negative for arthralgias, back pain, joint swelling and myalgias.  Skin:  Negative for color change, pallor, rash and wound.  Neurological:  Negative for dizziness, facial asymmetry, weakness, numbness and headaches.  Psychiatric/Behavioral:  Negative for behavioral problems, confusion, self-injury and suicidal ideas.       Objective:    Physical Exam Vitals and nursing note reviewed.  Constitutional:      General: He is not in acute distress.    Appearance: Normal appearance. He is obese. He is not ill-appearing, toxic-appearing or diaphoretic.  HENT:     Mouth/Throat:     Mouth: Mucous membranes are moist.     Pharynx: Oropharynx is clear. No oropharyngeal exudate or posterior oropharyngeal erythema.  Eyes:     General: No scleral icterus.       Right eye: No discharge.        Left eye: No discharge.     Extraocular Movements: Extraocular movements intact.     Conjunctiva/sclera: Conjunctivae normal.  Cardiovascular:     Rate and Rhythm: Normal rate and regular rhythm.     Pulses: Normal pulses.     Heart  sounds: Normal heart sounds. No murmur heard.    No friction rub. No gallop.  Pulmonary:     Effort: Pulmonary effort is normal. No respiratory distress.     Breath sounds: Normal breath sounds. No stridor. No wheezing, rhonchi or rales.  Chest:     Chest wall: No tenderness.  Abdominal:     General: There is no distension.     Palpations: Abdomen is soft.     Tenderness: There is no abdominal tenderness. There is no right CVA tenderness, left CVA tenderness or guarding.  Musculoskeletal:        General: No swelling, tenderness, deformity or signs of injury.     Right lower leg: No edema.     Left lower leg: No edema.  Skin:    General: Skin is warm and dry.     Capillary Refill: Capillary refill takes less than 2 seconds.     Coloration: Skin is not jaundiced or pale.     Findings: No bruising, erythema or lesion.  Neurological:     Mental Status: He is alert and oriented to person, place, and time.     Motor: No weakness.     Coordination: Coordination normal.     Gait: Gait normal.  Psychiatric:        Mood and Affect: Mood normal.        Behavior: Behavior normal.        Thought Content: Thought content normal.        Judgment: Judgment normal.     BP 121/67   Pulse 85   Temp (!) 97 F (36.1 C)   Wt 291 lb (132 kg)   SpO2 100%   BMI 45.58 kg/m  Wt Readings from Last 3 Encounters:  01/09/23 291 lb (132 kg)  12/12/22 284 lb (128.8 kg)  10/04/22 280 lb (127 kg)    Lab Results  Component Value Date   TSH 2.369 08/04/2015   Lab Results  Component Value Date   WBC 6.9 08/30/2022   HGB 12.6 (L) 08/30/2022   HCT 38.7 (L) 08/30/2022   MCV 94.4 08/30/2022   PLT 151  08/30/2022   Lab Results  Component Value Date   NA 136 08/30/2022   K 4.6 08/30/2022   CO2 24 08/30/2022   GLUCOSE 175 (H) 08/30/2022   BUN 22 (H) 08/30/2022   CREATININE 0.75 08/30/2022   BILITOT 0.9 08/30/2022   ALKPHOS 70 08/30/2022   AST 29 08/30/2022   ALT 29 08/30/2022   PROT 7.2  08/30/2022   ALBUMIN 3.8 08/30/2022   CALCIUM 9.7 08/30/2022   ANIONGAP 14 08/30/2022   EGFR 107 05/10/2021   Lab Results  Component Value Date   CHOL 128 08/10/2021   Lab Results  Component Value Date   HDL 54 08/10/2021   Lab Results  Component Value Date   LDLCALC 54 08/10/2021   Lab Results  Component Value Date   TRIG 112 08/10/2021   Lab Results  Component Value Date   CHOLHDL 2.4 08/10/2021   Lab Results  Component Value Date   HGBA1C 9.4 (A) 12/12/2022      Assessment & Plan:   Problem List Items Addressed This Visit       Cardiovascular and Mediastinum   Diabetes mellitus with coincident hypertension (HCC)   Continue Ozempic 0.25 mg once weekly injection, increase to 0.5 mg once completed Continue Jardiance 25 mg daily, metformin 1000 mg twice daily glipizide 5 mg twice daily CBG goals discussed patient encouraged to call the office with blood sugar readings of less than 70 Patient counseled on low-carb modified diet Encouraged engage in regular moderate exercise at least 150 minutes weekly Up-to-date with diabetic eye exam records requested  Blood pressure is well-controlled on metoprolol 25 mg daily, lisinopril 2.5 mg daily HTN Controlled . Continue current medications. No changes in management. Discussed DASH diet and dietary sodium restrictions Continue to increase dietary efforts and exercise.         Relevant Medications   Semaglutide,0.25 or 0.5MG /DOS, (OZEMPIC, 0.25 OR 0.5 MG/DOSE,) 2 MG/3ML SOPN (Start on 01/16/2023)   Paroxysmal atrial fibrillation (HCC)   Continue flecainide 50 mg twice daily, Eliquis 5 mg twice daily, metoprolol 25 mg daily      Chronic heart failure (HCC)   Continue Jardiance 25 mg daily, metoprolol 25 mg daily        Other   Dyslipidemia   Well-controlled on Crestor 20 mg daily Continue current medication      Screening for colon cancer - Primary   Relevant Orders   Cologuard    Meds ordered this  encounter  Medications   Semaglutide,0.25 or 0.5MG /DOS, (OZEMPIC, 0.25 OR 0.5 MG/DOSE,) 2 MG/3ML SOPN    Sig: Inject 0.5 mg into the skin once a week.    Dispense:  3 mL    Refill:  2    Follow-up: Return in about 2 months (around 03/12/2023).    Donell Beers, FNP

## 2023-01-09 NOTE — Patient Instructions (Signed)
Please continue Ozempic 0.25 mg,once weekly injection after you run out of this dose, start Ozempic 0.5 mg once weekly.  Continue your other medications as ordered  Goal for fasting blood sugar ranges from 80 to 120 and 2 hours after any meal or at bedtime should be between 130 to 170.    It is important that you exercise regularly at least 30 minutes 5 times a week as tolerated  Think about what you will eat, plan ahead. Choose " clean, green, fresh or frozen" over canned, processed or packaged foods which are more sugary, salty and fatty. 70 to 75% of food eaten should be vegetables and fruit. Three meals at set times with snacks allowed between meals, but they must be fruit or vegetables. Aim to eat over a 12 hour period , example 7 am to 7 pm, and STOP after  your last meal of the day. Drink water,generally about 64 ounces per day, no other drink is as healthy. Fruit juice is best enjoyed in a healthy way, by EATING the fruit.  Thanks for choosing [facility] we consider it a privelige to serve you.

## 2023-01-09 NOTE — Progress Notes (Signed)
Called [facility] LM for medical records per [facility] request -CB

## 2023-02-10 ENCOUNTER — Telehealth: Payer: Self-pay

## 2023-02-14 NOTE — Telephone Encounter (Signed)
Completed.

## 2023-03-13 ENCOUNTER — Ambulatory Visit: Payer: Self-pay | Admitting: Nurse Practitioner

## 2023-03-29 ENCOUNTER — Ambulatory Visit: Payer: 59 | Admitting: Nurse Practitioner

## 2023-03-29 ENCOUNTER — Encounter: Payer: Self-pay | Admitting: Nurse Practitioner

## 2023-03-29 VITALS — BP 108/58 | HR 77 | Temp 98.4°F | Wt 288.0 lb

## 2023-03-29 DIAGNOSIS — I480 Paroxysmal atrial fibrillation: Secondary | ICD-10-CM | POA: Diagnosis not present

## 2023-03-29 DIAGNOSIS — I10 Essential (primary) hypertension: Secondary | ICD-10-CM | POA: Diagnosis not present

## 2023-03-29 DIAGNOSIS — E119 Type 2 diabetes mellitus without complications: Secondary | ICD-10-CM | POA: Diagnosis not present

## 2023-03-29 DIAGNOSIS — E785 Hyperlipidemia, unspecified: Secondary | ICD-10-CM

## 2023-03-29 LAB — POCT GLYCOSYLATED HEMOGLOBIN (HGB A1C): Hemoglobin A1C: 10.8 % — AB (ref 4.0–5.6)

## 2023-03-29 MED ORDER — GLIPIZIDE 10 MG PO TABS: 10.0000 mg | ORAL_TABLET | Freq: Two times a day (BID) | ORAL | 1 refills | Status: DC

## 2023-03-29 MED ORDER — SITAGLIPTIN PHOSPHATE 100 MG PO TABS: 100.0000 mg | ORAL_TABLET | Freq: Every day | ORAL | 1 refills | Status: DC

## 2023-03-29 MED ORDER — METFORMIN HCL ER 500 MG PO TB24
1000.0000 mg | ORAL_TABLET | Freq: Every day | ORAL | 1 refills | Status: DC
Start: 2023-03-29 — End: 2023-06-07

## 2023-03-29 NOTE — Assessment & Plan Note (Signed)
 Lab Results  Component Value Date   CHOL 128 08/10/2021   HDL 54 08/10/2021   LDLCALC 54 08/10/2021   TRIG 112 08/10/2021   CHOLHDL 2.4 08/10/2021  Currently on Crestor 20 mg daily We will check lipid panel at next visit

## 2023-03-29 NOTE — Assessment & Plan Note (Signed)
 Wt Readings from Last 3 Encounters:  03/29/23 288 lb (130.6 kg)  01/09/23 291 lb (132 kg)  12/12/22 284 lb (128.8 kg)   Body mass index is 45.11 kg/m.  Patient counseled on low-carb diet Encouraged engage in regular moderate exercise at least 150 minutes weekly

## 2023-03-29 NOTE — Progress Notes (Signed)
 Established Patient Office Visit  Subjective:  Patient ID: Juan Schwartz, male    DOB: 05/05/1965  Age: 58 y.o. MRN: [id number]  CC:  Chief Complaint  Patient presents with   Diabetes    HPI Juan Schwartz is a 58 y.o. male  has a past medical history of Allergy, Atrial fibrillation (HCC) (08/04/2015), Dehydration (08/04/2015), Diabetes mellitus, type 2 (HCC) (11/29/2011), Essential hypertension (08/04/2015), Hyperlipidemia, Hypertension, Mild sleep apnea (2017), NICM (nonischemic cardiomyopathy) (HCC), Normal coronary arteries (2004), and Obesity (11/29/2011).  Patient presents for follow-up for his chronic medical conditions  Uncontrolled type 2 diabetes.  Currently on Jardiance 25 mg daily, glipizide 10 mg twice daily, metformin 1000 mg daily.  He has stopped taking Ozempic due to diarrhea and nausea, had increased glipizide to 10 mg twice daily from 5 mg twice daily after stopping Ozempic.  Stated that his nausea and diarrhea lasted for about a week after he stopped taking Ozempic. Patient confirmed that his diet can be better, works 2 jobs and it is hard for him to follow a healthy diet.  Patient denies polyuria polydipsia polyphagia.  Takes rosuvastatin 20 mg daily.    Hypertension.  Currently on metoprolol 25 mg daily, lisinopril 2.5 mg daily.  Patient denies chest pain, shortness of breath, edema   Patient declines influenza vaccine shingles vaccine, Tdap vaccine, pneumococcal vaccine need for all vaccines discussed   Past Medical History:  Diagnosis Date   Allergy    Atrial fibrillation (HCC) 08/04/2015   CHA2DS2-VASc = 3   Dehydration 08/04/2015   Diabetes mellitus, type 2 (HCC) 11/29/2011   Essential hypertension 08/04/2015   Hyperlipidemia    Hypertension    Mild sleep apnea 2017   NICM (nonischemic cardiomyopathy) (HCC)    Normal coronary arteries 2004   Obesity 11/29/2011    Past Surgical History:  Procedure Laterality Date   CARDIAC CATHETERIZATION   2005   CARDIOVERSION N/A 08/05/2015   Procedure: CARDIOVERSION;  Surgeon: Jake Bathe, MD;  Location: [facility] ENDOSCOPY;  Service: Cardiovascular;  Laterality: N/A;   CARDIOVERSION N/A 07/19/2017   Procedure: CARDIOVERSION;  Surgeon: Vesta Mixer, MD;  Location: [facility] ENDOSCOPY;  Service: Cardiovascular;  Laterality: N/A;   CARDIOVERSION N/A 03/25/2019   Procedure: CARDIOVERSION;  Surgeon: Jake Bathe, MD;  Location: [facility] ENDOSCOPY;  Service: Cardiovascular;  Laterality: N/A;   HERNIA REPAIR     TEE WITHOUT CARDIOVERSION N/A 08/05/2015   Procedure: TRANSESOPHAGEAL ECHOCARDIOGRAM (TEE);  Surgeon: Jake Bathe, MD;  Location: [facility] ENDOSCOPY;  Service: Cardiovascular;  Laterality: N/A;    Family History  Problem Relation Age of Onset   Diabetes Mother     Social History   Socioeconomic History   Marital status: Married    Spouse name: Not on file   Number of children: Not on file   Years of education: Not on file   Highest education level: Not on file  Occupational History   Not on file  Tobacco Use   Smoking status: Former    Passive exposure: Past   Smokeless tobacco: Never  Vaping Use   Vaping status: Never Used  Substance and Sexual Activity   Alcohol use: Yes    Alcohol/week: 4.0 standard drinks of alcohol    Types: 2 Glasses of wine, 2 Cans of beer per week    Comment: occ   Drug use: No   Sexual activity: Yes  Other Topics Concern   Not on file  Social History Narrative   Lives in  [place]   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   Social Drivers of Corporate investment banker Strain: Low Risk  (08/31/2022)   Overall Financial Resource Strain (CARDIA)    Difficulty of Paying Living Expenses: Not very hard  Food Insecurity: No Food Insecurity (08/31/2022)   Hunger Vital Sign    Worried About Running Out of Food in the Last Year: Never true    Ran Out of Food in the Last Year: Never true  Transportation Needs: No Transportation Needs (08/31/2022)   PRAPARE  - Administrator, Civil Service (Medical): No    Lack of Transportation (Non-Medical): No  Physical Activity: Unknown (08/31/2022)   Exercise Vital Sign    Days of Exercise per Week: 0 days    Minutes of Exercise per Session: Not on file  Stress: No Stress Concern Present (08/31/2022)   [employer] of Occupational Health - Occupational Stress Questionnaire    Feeling of Stress : Only a little  Social Connections: Socially Isolated (08/31/2022)   Social Connection and Isolation Panel [NHANES]    Frequency of Communication with Friends and Family: Twice a week    Frequency of Social Gatherings with Friends and Family: Never    Attends Religious Services: Never    Database administrator or Organizations: No    Attends Engineer, structural: Not on file    Marital Status: Married  Catering manager Violence: Not on file    Outpatient Medications Prior to Visit  Medication Sig Dispense Refill   acetaminophen (TYLENOL) 650 MG CR tablet Take 650 mg by mouth every 8 (eight) hours as needed for pain.     apixaban (ELIQUIS) 5 MG TABS tablet Take 1 tablet by mouth twice daily 180 tablet 1   Blood Glucose Monitoring Suppl (ONE TOUCH ULTRA MINI) w/Device KIT Use to check blood glucose 2x daily 1 kit 0   flecainide (TAMBOCOR) 50 MG tablet Take 1 tablet (50 mg total) by mouth 2 (two) times daily. 180 tablet 3   JARDIANCE 25 MG TABS tablet Take 1 tablet (25 mg total) by mouth daily. 90 tablet 1   lisinopril (ZESTRIL) 2.5 MG tablet Take 1 tablet (2.5 mg total) by mouth daily. 90 tablet 1   metoprolol succinate (TOPROL-XL) 25 MG 24 hr tablet TAKE 1 TABLET BY MOUTH TWICE DAILY .  MAY  TAKE  AN  EXTRA  TABLET  AS  NEEDED  FOR  BREAKTHROUGH. 225 tablet 3   rosuvastatin (CRESTOR) 20 MG tablet Take 1 tablet (20 mg total) by mouth daily. 90 tablet 1   sildenafil (VIAGRA) 100 MG tablet Take 1 tablet by mouth as needed.     glipiZIDE (GLUCOTROL) 5 MG tablet Take 1 tablet (5 mg total) by mouth  2 (two) times daily before a meal. 60 tablet 3   metFORMIN (GLUCOPHAGE-XR) 500 MG 24 hr tablet Take 2 tablets (1,000 mg total) by mouth daily with breakfast. 120 tablet 3   meloxicam (MOBIC) 15 MG tablet Take 15 mg by mouth as needed for pain. (Patient not taking: Reported on 03/29/2023)     ONETOUCH VERIO test strip USE TO CHECK BLOOD GLUCOSE TWICE DAILY (Patient not taking: Reported on 03/29/2023) 100 each 0   Semaglutide,0.25 or 0.5MG /DOS, (OZEMPIC, 0.25 OR 0.5 MG/DOSE,) 2 MG/3ML SOPN Inject 0.5 mg into the skin once a week. (Patient not taking: Reported on 03/29/2023) 3 mL 2   Semaglutide,0.25 or 0.5MG /DOS, 2 MG/3ML SOPN Inject 0.25 mg into  the skin once a week. (Patient not taking: Reported on 03/29/2023) 3 mL 0   No facility-administered medications prior to visit.    No Known Allergies  ROS Review of Systems  Constitutional:  Negative for appetite change, chills, fatigue and fever.  HENT:  Negative for congestion, postnasal drip, rhinorrhea and sneezing.   Respiratory:  Negative for cough, shortness of breath and wheezing.   Cardiovascular:  Negative for chest pain, palpitations and leg swelling.  Gastrointestinal:  Negative for abdominal pain, constipation, nausea and vomiting.  Genitourinary:  Negative for difficulty urinating, dysuria, flank pain and frequency.  Musculoskeletal:  Negative for arthralgias, back pain, joint swelling and myalgias.  Skin:  Negative for color change, pallor, rash and wound.  Neurological:  Negative for dizziness, facial asymmetry, weakness, numbness and headaches.  Psychiatric/Behavioral:  Negative for behavioral problems, confusion, self-injury and suicidal ideas.       Objective:    Physical Exam Vitals and nursing note reviewed.  Constitutional:      General: He is not in acute distress.    Appearance: Normal appearance. He is obese. He is not ill-appearing, toxic-appearing or diaphoretic.  HENT:     Mouth/Throat:     Mouth: Mucous membranes are  moist.     Pharynx: Oropharynx is clear. No oropharyngeal exudate or posterior oropharyngeal erythema.  Eyes:     General: No scleral icterus.       Right eye: No discharge.        Left eye: No discharge.     Extraocular Movements: Extraocular movements intact.     Conjunctiva/sclera: Conjunctivae normal.  Cardiovascular:     Rate and Rhythm: Normal rate and regular rhythm.     Pulses: Normal pulses.     Heart sounds: Normal heart sounds. No murmur heard.    No friction rub. No gallop.  Pulmonary:     Effort: Pulmonary effort is normal. No respiratory distress.     Breath sounds: Normal breath sounds. No stridor. No wheezing, rhonchi or rales.  Chest:     Chest wall: No tenderness.  Abdominal:     General: There is no distension.     Palpations: Abdomen is soft.     Tenderness: There is no abdominal tenderness. There is no right CVA tenderness, left CVA tenderness or guarding.  Musculoskeletal:        General: No swelling, tenderness, deformity or signs of injury.     Right lower leg: No edema.     Left lower leg: No edema.  Skin:    General: Skin is warm and dry.     Capillary Refill: Capillary refill takes less than 2 seconds.     Coloration: Skin is not jaundiced or pale.     Findings: No bruising, erythema or lesion.  Neurological:     Mental Status: He is alert and oriented to person, place, and time.     Motor: No weakness.     Coordination: Coordination normal.     Gait: Gait normal.  Psychiatric:        Mood and Affect: Mood normal.        Behavior: Behavior normal.        Thought Content: Thought content normal.        Judgment: Judgment normal.     BP (!) 108/58   Pulse 77   Temp 98.4 F (36.9 C)   Wt 288 lb (130.6 kg)   SpO2 97%   BMI 45.11 kg/m  Wt Readings from  Last 3 Encounters:  03/29/23 288 lb (130.6 kg)  01/09/23 291 lb (132 kg)  12/12/22 284 lb (128.8 kg)    Lab Results  Component Value Date   TSH 2.369 08/04/2015   Lab Results   Component Value Date   WBC 6.9 08/30/2022   HGB 12.6 (L) 08/30/2022   HCT 38.7 (L) 08/30/2022   MCV 94.4 08/30/2022   PLT 151 08/30/2022   Lab Results  Component Value Date   NA 136 08/30/2022   K 4.6 08/30/2022   CO2 24 08/30/2022   GLUCOSE 175 (H) 08/30/2022   BUN 22 (H) 08/30/2022   CREATININE 0.75 08/30/2022   BILITOT 0.9 08/30/2022   ALKPHOS 70 08/30/2022   AST 29 08/30/2022   ALT 29 08/30/2022   PROT 7.2 08/30/2022   ALBUMIN 3.8 08/30/2022   CALCIUM 9.7 08/30/2022   ANIONGAP 14 08/30/2022   EGFR 107 05/10/2021   Lab Results  Component Value Date   CHOL 128 08/10/2021   Lab Results  Component Value Date   HDL 54 08/10/2021   Lab Results  Component Value Date   LDLCALC 54 08/10/2021   Lab Results  Component Value Date   TRIG 112 08/10/2021   Lab Results  Component Value Date   CHOLHDL 2.4 08/10/2021   Lab Results  Component Value Date   HGBA1C 10.8 (A) 03/29/2023      Assessment & Plan:   Problem List Items Addressed This Visit       Cardiovascular and Mediastinum   Diabetes mellitus with coincident hypertension (HCC) - Primary   Lab Results  Component Value Date   HGBA1C 10.8 (A) 03/29/2023  Chronic uncontrolled condition He declined the services of the clinical pharmacist we discussed referral to endocrinologist but the patient declined, we discussed starting insulin but the patient stated that I am not going to take insulin He wants to continue metformin 1000 mg daily states that higher dose of metformin gives him diarrhea, continue glipizide 10 mg twice daily, Jardiance 25 mg daily, start Januvia 100 mg daily Patient counseled on low-carb diet, we discussed referral for diabetes nutrition education but the patient declined. Up-to-date with diabetic eye exam records requested again today CBG goals discussed Need to get diabetes under control discussed Follow-up in 4 weeks   Blood pressure is well-controlled on metoprolol 25 mg daily,  lisinopril 2.5 mg daily Continue current medications. No changes in management. Discussed DASH diet and dietary sodium restrictions Continue to increase dietary efforts and exercise.          Relevant Medications   metFORMIN (GLUCOPHAGE-XR) 500 MG 24 hr tablet   glipiZIDE (GLUCOTROL) 10 MG tablet   sitaGLIPtin (JANUVIA) 100 MG tablet   Other Relevant Orders   POCT glycosylated hemoglobin (Hb A1C) (Completed)   Paroxysmal atrial fibrillation (HCC)     Other   Morbid obesity (HCC)   Wt Readings from Last 3 Encounters:  03/29/23 288 lb (130.6 kg)  01/09/23 291 lb (132 kg)  12/12/22 284 lb (128.8 kg)   Body mass index is 45.11 kg/m.  Patient counseled on low-carb diet Encouraged engage in regular moderate exercise at least 150 minutes weekly      Relevant Medications   metFORMIN (GLUCOPHAGE-XR) 500 MG 24 hr tablet   glipiZIDE (GLUCOTROL) 10 MG tablet   sitaGLIPtin (JANUVIA) 100 MG tablet   Dyslipidemia   Lab Results  Component Value Date   CHOL 128 08/10/2021   HDL 54 08/10/2021   LDLCALC 54 08/10/2021  TRIG 112 08/10/2021   CHOLHDL 2.4 08/10/2021  Currently on Crestor 20 mg daily We will check lipid panel at next visit       Meds ordered this encounter  Medications   metFORMIN (GLUCOPHAGE-XR) 500 MG 24 hr tablet    Sig: Take 2 tablets (1,000 mg total) by mouth daily with breakfast.    Dispense:  180 tablet    Refill:  1   glipiZIDE (GLUCOTROL) 10 MG tablet    Sig: Take 1 tablet (10 mg total) by mouth 2 (two) times daily before a meal.    Dispense:  180 tablet    Refill:  1   sitaGLIPtin (JANUVIA) 100 MG tablet    Sig: Take 1 tablet (100 mg total) by mouth daily.    Dispense:  90 tablet    Refill:  1    Follow-up: Return in about 4 weeks (around 04/26/2023).    Donell Beers, FNP

## 2023-03-29 NOTE — Patient Instructions (Signed)
 Goal for fasting blood sugar ranges from 80 to 120 and 2 hours after any meal or at bedtime should be between 130 to 170.     1. Diabetes mellitus with coincident hypertension (HCC) (Primary)  - POCT glycosylated hemoglobin (Hb A1C) - metFORMIN (GLUCOPHAGE-XR) 500 MG 24 hr tablet; Take 2 tablets (1,000 mg total) by mouth daily with breakfast.  Dispense: 180 tablet; Refill: 1 - glipiZIDE (GLUCOTROL) 10 MG tablet; Take 1 tablet (10 mg total) by mouth 2 (two) times daily before a meal.  Dispense: 180 tablet; Refill: 1 - sitaGLIPtin (JANUVIA) 100 MG tablet; Take 1 tablet (100 mg total) by mouth daily.  Dispense: 90 tablet; Refill: 1    It is important that you exercise regularly at least 30 minutes 5 times a week as tolerated  Think about what you will eat, plan ahead. Choose " clean, green, fresh or frozen" over canned, processed or packaged foods which are more sugary, salty and fatty. 70 to 75% of food eaten should be vegetables and fruit. Three meals at set times with snacks allowed between meals, but they must be fruit or vegetables. Aim to eat over a 12 hour period , example 7 am to 7 pm, and STOP after  your last meal of the day. Drink water,generally about 64 ounces per day, no other drink is as healthy. Fruit juice is best enjoyed in a healthy way, by EATING the fruit.  Thanks for choosing [facility] we consider it a privelige to serve you.

## 2023-03-29 NOTE — Assessment & Plan Note (Signed)
 Lab Results  Component Value Date   HGBA1C 10.8 (A) 03/29/2023  Chronic uncontrolled condition He declined the services of the clinical pharmacist we discussed referral to endocrinologist but the patient declined, we discussed starting insulin but the patient stated that I am not going to take insulin He wants to continue metformin 1000 mg daily states that higher dose of metformin gives him diarrhea, continue glipizide 10 mg twice daily, Jardiance 25 mg daily, start Januvia 100 mg daily Patient counseled on low-carb diet, we discussed referral for diabetes nutrition education but the patient declined. Up-to-date with diabetic eye exam records requested again today CBG goals discussed Need to get diabetes under control discussed Follow-up in 4 weeks   Blood pressure is well-controlled on metoprolol 25 mg daily, lisinopril 2.5 mg daily Continue current medications. No changes in management. Discussed DASH diet and dietary sodium restrictions Continue to increase dietary efforts and exercise.

## 2023-04-01 ENCOUNTER — Other Ambulatory Visit: Payer: Self-pay | Admitting: Nurse Practitioner

## 2023-04-01 DIAGNOSIS — E119 Type 2 diabetes mellitus without complications: Secondary | ICD-10-CM

## 2023-04-05 ENCOUNTER — Other Ambulatory Visit: Payer: Self-pay | Admitting: Nurse Practitioner

## 2023-04-05 ENCOUNTER — Telehealth: Payer: Self-pay

## 2023-04-05 DIAGNOSIS — E119 Type 2 diabetes mellitus without complications: Secondary | ICD-10-CM

## 2023-04-05 NOTE — Telephone Encounter (Signed)
 Copied from CRM [id number]. Topic: Clinical - Prescription Issue >> Apr 05, 2023 11:20 AM Franchot Heidelberg wrote: Reason for CRM: Pt called requesting to speak to the clinic regarding his sitaGLIPtin (JANUVIA) 100 MG tablet he says this is too expensive even with his insurance. Requesting a call back for a more affordable alternative

## 2023-04-06 ENCOUNTER — Telehealth: Payer: Self-pay | Admitting: *Deleted

## 2023-04-06 NOTE — Progress Notes (Signed)
 Complex Care Management Note Care Guide Note  04/06/2023 Name: Juan Schwartz MRN: [id number] DOB: 1965-12-15   Complex Care Management Outreach Attempts: An unsuccessful telephone outreach was attempted today to offer the patient information about available complex care management services.  Follow Up Plan:  Additional outreach attempts will be made to offer the patient complex care management information and services.   Encounter Outcome:  Patient Request to Call Back  Gwenevere Ghazi  [facility]  [facility], [facility] Guide  Direct Dial: [phone]  Fax [phone]

## 2023-04-07 NOTE — Telephone Encounter (Signed)
 Called again no answer. KH

## 2023-04-11 NOTE — Progress Notes (Signed)
 Care Guide Pharmacy Note  04/11/2023 Name: Juan Schwartz MRN: [id number] DOB: Feb 12, 1965  Referred By: Donell Beers, FNP Reason for referral: Care Coordination (Initial outreach to schedule referral with Cephus Shelling PharmD )   [facility] Stearns is a 58 y.o. year old male who is a primary care patient of Donell Beers, FNP.  [name] was referred to the pharmacist for assistance related to: DMII for medication assistance for Januvia .   Successful contact was made with the patient to discuss pharmacy services including being ready for the pharmacist to call at least 5 minutes before the scheduled appointment time and to have medication bottles and any blood pressure readings ready for review. The patient agreed to meet with the pharmacist via telephone visit on (date/time).04/28/23 at 1300  Gwenevere Ghazi  [facility], [facility] Guide  Direct Dial: [phone]  Fax [phone]   SIG

## 2023-04-28 ENCOUNTER — Other Ambulatory Visit: Payer: Self-pay

## 2023-04-28 ENCOUNTER — Ambulatory Visit: Payer: Self-pay | Admitting: Nurse Practitioner

## 2023-04-28 DIAGNOSIS — Z5986 Financial insecurity: Secondary | ICD-10-CM

## 2023-04-28 NOTE — Patient Instructions (Addendum)
 Mr. Ingle,   We discussed exercising 150 minutes per week (such as walking 30 min for 5 days a week) and adjusting diet (see tips below).   Eat Balanced Meals: Carbohydrates: Focus on complex carbs like whole grains, vegetables, and legumes. Limit refined carbs (white bread, sugary snacks). Proteins: Include lean proteins like chicken, fish, tofu, and beans to help control blood sugar. Healthy Fats: Choose sources like avocados, nuts, seeds, and olive oil. Monitor Carb Intake: Count carbs to keep blood sugar levels steady. Pair carbs with proteins or fats to reduce blood sugar spikes. Portion Control: Avoid large meals; aim for smaller, more frequent meals to stabilize blood sugar. Use smaller plates to help with portion control. Limit Sugary Foods: Avoid sugary drinks (soda, juices) and desserts high in sugar. Opt for sugar substitutes or naturally sweet foods like fruit. Fiber is Your Friend: Fiber helps slow sugar absorption and improves blood sugar control. Include fiber-rich foods like vegetables, fruits, whole grains, and legumes. Stay Hydrated: Drink plenty of water throughout the day to avoid dehydration, which can affect blood sugar levels. Limit Alcohol: If you drink alcohol, do so in moderation and always with food to prevent blood sugar drops. Consistent Meal Timing: Try to eat at the same times every day to help your body regulate insulin and blood sugar. Choose Low-Glycemic Index (GI) Foods: Opt for foods that cause a slower rise in blood sugar, like whole grains, legumes, and non-starchy vegetables. Consult Your Healthcare Team: Regular check-ins with your dietitian or doctor can help you manage your diabetes more effectively.  Thank you for allowing pharmacy to be a part of this patient's care. Cephus Shelling, PharmD Clinical Pharmacist Cell: [phone]

## 2023-04-28 NOTE — Progress Notes (Signed)
    04/28/2023 Name: Juan Schwartz MRN: [id number] DOB: [birth year]  Chief Complaint  Patient presents with   Medication Assistance    Juan Schwartz is a 58 y.o. year old male who presented for a telephone visit.   They were referred to the pharmacist by their PCP for assistance in managing medication access for Januvia. When patient was called it was on his lunch break and the call had to be abbreviated. We did not review medications and allergies.    Subjective:  Care Team: Primary Care Provider: Donell Beers, FNP ; Next Scheduled Visit: 06/07/2023 (last appointment 03/29/2023)   Medication Access/Adherence  Current Pharmacy:  [facility] [address] ([address]), [place] - [address] [place] ([place]) [place] [address] Phone: [phone] Fax: [phone]    Diabetes:  Current medications: Jardiance 25 mg daily, metformin XR 1000 mg daily, Januvia 100 mg daily (cannot afford $50 copay)  Medications tried in the past: metformin XR 1000 mg twice daily (unable to tolerate), Ozempic stopped due to intolerance of diarrhea  Current meal patterns:  Baked/broiled chicken, fried fish (generally tries to avoid fried foods); ice cream, spaghetti (tries to avoid rice, pasta, potatoes)    Objective:  Lab Results  Component Value Date   HGBA1C 10.8 (A) 03/29/2023    Lab Results  Component Value Date   CREATININE 0.75 08/30/2022   BUN 22 (H) 08/30/2022   NA 136 08/30/2022   K 4.6 08/30/2022   CL 98 08/30/2022   CO2 24 08/30/2022    Lab Results  Component Value Date   CHOL 128 08/10/2021   HDL 54 08/10/2021   LDLCALC 54 08/10/2021   TRIG 112 08/10/2021   CHOLHDL 2.4 08/10/2021    Medications Reviewed Today   Medications were not reviewed in this encounter       Assessment/Plan:   Diabetes: - Currently uncontrolled. Patient refused injectables and insulin statin that he would do anything to avoid being on those medications.  Provided education on stigma regarding insulin.  Discussed with patient that he would need really try to adjust his diet and exercise to avoid insulin to lower his A1c. Discussed with him his current regimen will need to be adjusted but will defer for now since he will start Januvia.  - Reviewed long term cardiovascular and renal outcomes of uncontrolled blood sugar - Reviewed goal A1c, goal fasting, and goal 2 hour post prandial glucose - Reviewed dietary modifications including eating more protein paired with healthy carbohydrates and reduce sweets/desserts  - Reviewed lifestyle modifications including: 150 minutes per week of moderate to high intensity statin - Recommend to patient to continue current regimen. Discussed with patient future considerations of Rybelsus and explained similarities to Ozempic and he verbalized understanding. He also seemed opened to possibly trying Rybelsus if needed. We also discussed possibly increasing metformin to 1000 mg QAM and 500 mg QPM and he was somewhat agreeable.  - Informed patient that discount savings card will need to be renewed next year.    Manufacturer coupon card:     Follow Up Plan: 2-4 weeks  Cephus Shelling, PharmD Clinical Pharmacist Cell: [phone]

## 2023-05-16 ENCOUNTER — Telehealth: Payer: Self-pay

## 2023-05-16 ENCOUNTER — Other Ambulatory Visit: Payer: Self-pay

## 2023-05-16 DIAGNOSIS — E119 Type 2 diabetes mellitus without complications: Secondary | ICD-10-CM

## 2023-05-16 NOTE — Progress Notes (Signed)
   05/16/2023  Patient ID: Juan Schwartz, male   DOB: 12-02-1965, 58 y.o.   MRN: [id number]  Attempted to contact patient for medication management/review. Left HIPAA compliant message for patient to return my call at their convenience.    Attempted for patient outreach today but he stated it was a bad time to talk as he was in a meeting. Will reschedule his appointment. Today, was going to follow up on medication compliance for diabetic medications and trend home blood glucose readings. Given A1c, patient will most likely need additional therapy with insulin  or another agent.   Thank you for allowing pharmacy to be a part of this patient's care.  Alexandria Angel, PharmD Clinical Pharmacist Cell: [phone]

## 2023-05-31 ENCOUNTER — Encounter (HOSPITAL_COMMUNITY): Payer: Self-pay

## 2023-06-05 ENCOUNTER — Other Ambulatory Visit: Payer: Self-pay | Admitting: Nurse Practitioner

## 2023-06-05 DIAGNOSIS — E119 Type 2 diabetes mellitus without complications: Secondary | ICD-10-CM

## 2023-06-07 ENCOUNTER — Ambulatory Visit: Payer: Self-pay | Admitting: Nurse Practitioner

## 2023-06-07 ENCOUNTER — Encounter: Payer: Self-pay | Admitting: Nurse Practitioner

## 2023-06-07 VITALS — BP 122/67 | HR 71 | Temp 97.8°F | Wt 280.0 lb

## 2023-06-07 DIAGNOSIS — I10 Essential (primary) hypertension: Secondary | ICD-10-CM | POA: Diagnosis not present

## 2023-06-07 DIAGNOSIS — G629 Polyneuropathy, unspecified: Secondary | ICD-10-CM | POA: Insufficient documentation

## 2023-06-07 DIAGNOSIS — E785 Hyperlipidemia, unspecified: Secondary | ICD-10-CM | POA: Diagnosis not present

## 2023-06-07 DIAGNOSIS — E119 Type 2 diabetes mellitus without complications: Secondary | ICD-10-CM

## 2023-06-07 MED ORDER — JARDIANCE 25 MG PO TABS
25.0000 mg | ORAL_TABLET | Freq: Every day | ORAL | 1 refills | Status: DC
Start: 2023-06-07 — End: 2023-08-29

## 2023-06-07 MED ORDER — GABAPENTIN 300 MG PO CAPS
300.0000 mg | ORAL_CAPSULE | Freq: Every day | ORAL | 0 refills | Status: DC
Start: 2023-06-07 — End: 2023-08-29

## 2023-06-07 MED ORDER — METFORMIN HCL ER 500 MG PO TB24
1000.0000 mg | ORAL_TABLET | Freq: Two times a day (BID) | ORAL | 2 refills | Status: DC
Start: 2023-06-07 — End: 2023-08-29

## 2023-06-07 MED ORDER — SITAGLIPTIN PHOSPHATE 100 MG PO TABS
100.0000 mg | ORAL_TABLET | Freq: Every day | ORAL | 1 refills | Status: DC
Start: 2023-06-07 — End: 2023-08-29

## 2023-06-07 NOTE — Assessment & Plan Note (Addendum)
 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

## 2023-06-07 NOTE — Assessment & Plan Note (Signed)
 Wt Readings from Last 3 Encounters:  06/07/23 280 lb (127 kg)  03/29/23 288 lb (130.6 kg)  01/09/23 291 lb (132 kg)   Body mass index is 43.85 kg/m.  Patient counseled on low-carb diet Encouraged to engage in regular moderate exercises at least 150 minutes weekly as tolerated.

## 2023-06-07 NOTE — Assessment & Plan Note (Signed)
 Lab Results  Component Value Date   CHOL 128 08/10/2021   HDL 54 08/10/2021   LDLCALC 54 08/10/2021   TRIG 112 08/10/2021   CHOLHDL 2.4 08/10/2021  Checking lipid panel continue rosuvastatin  20 mg daily

## 2023-06-07 NOTE — Patient Instructions (Signed)
 Goal for fasting blood sugar ranges from 80 to 120 and 2 hours after any meal or at bedtime should be between 130 to 170.     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  Dispense: 90 tablet; Refill: 1 - metFORMIN  (GLUCOPHAGE -XR) 500 MG 24 hr tablet; Take 2 tablets (1,000 mg total) by mouth 2 (two) times daily with a meal.  Dispense: 180 tablet; Refill: 2 - sitaGLIPtin  (JANUVIA ) 100 MG tablet; Take 1 tablet (100 mg total) by mouth daily.  Dispense: 90 tablet; Refill: 1 - Ambulatory referral to Podiatry    . Neuropathy  - Vitamin B12 - gabapentin (NEURONTIN) 300 MG capsule; Take 1 capsule (300 mg total) by mouth at bedtime.  Dispense: 90 capsule; Refill: 0   It is important that you exercise regularly at least 30 minutes 5 times a week as tolerated  Think about what you will eat, plan ahead. Choose " clean, green, fresh or frozen" over canned, processed or packaged foods which are more sugary, salty and fatty. 70 to 75% of food eaten should be vegetables and fruit. Three meals at set times with snacks allowed between meals, but they must be fruit or vegetables. Aim to eat over a 12 hour period , example 7 am to 7 pm, and STOP after  your last meal of the day. Drink water,generally about 64 ounces per day, no other drink is as healthy. Fruit juice is best enjoyed in a healthy way, by EATING the fruit.  Thanks for choosing [facility] we consider it a privelige to serve you.

## 2023-06-07 NOTE — Assessment & Plan Note (Signed)
 Well-controlled on lisinopril  2.5 mg daily, metoprolol  25 mg daily Continue current medications DASH diet and commitment to daily physical activity for a minimum of 30 minutes discussed and encouraged, as a part of hypertension management. The importance of attaining a healthy weight is also discussed.     06/07/2023    8:01 AM 03/29/2023    2:09 PM 01/09/2023    2:46 PM 12/12/2022    2:51 PM 10/04/2022   10:54 AM 09/06/2022   11:21 AM 08/30/2022   10:42 AM  BP/Weight  Systolic BP 122 108 121 106 100 106 101  Diastolic BP 67 58 67 69 70 60 69  Wt. (Lbs) 280 288 291 284 280 280   BMI 43.85 kg/m2 45.11 kg/m2 45.58 kg/m2 44.48 kg/m2 43.85 kg/m2 43.85 kg/m2

## 2023-06-07 NOTE — Assessment & Plan Note (Addendum)
 Start gabapentin 300 mg daily at bedtime Need to get diabetes under control discussed Checking B12 level

## 2023-06-07 NOTE — Progress Notes (Signed)
 Established Patient Office Visit  Subjective:  Patient ID: Juan Schwartz, male    DOB: 1965-06-13  Age: 58 y.o. MRN: [id number]  CC:  Chief Complaint  Patient presents with   Medical Management of Chronic Issues    HPI Juan Schwartz is a 58 y.o. male  has a past medical history of Allergy, Atrial fibrillation (HCC) (08/04/2015), Dehydration (08/04/2015), Diabetes mellitus, type 2 (HCC) (11/29/2011), Essential hypertension (08/04/2015), Hyperlipidemia, Hypertension, Mild sleep apnea (2017), NICM (nonischemic cardiomyopathy) (HCC), Normal coronary arteries (2004), and Obesity (11/29/2011).  Patient presents for follow-up for type 2 diabetes  Uncontrolled type 2 diabetes.  Currently on metformin  1000 mg daily, Januvia  100 mg daily, glipizide  10 mg twice daily.  Has a glucometer at home but does not use it, does not like sticking himself.  Had his blood sugar checked at work 1 day and it was in the 140s.  He has been avoiding high carbohydrate foods like soda rice bread.  He denies polyuria polyphagia polydipsia   neuropathy.  Patient complains of burning tingling sensations in both feet, his symptoms are worse at nighttime.  Currently not on medication     Past Medical History:  Diagnosis Date   Allergy    Atrial fibrillation (HCC) 08/04/2015   CHA2DS2-VASc = 3   Dehydration 08/04/2015   Diabetes mellitus, type 2 (HCC) 11/29/2011   Essential hypertension 08/04/2015   Hyperlipidemia    Hypertension    Mild sleep apnea 2017   NICM (nonischemic cardiomyopathy) (HCC)    Normal coronary arteries 2004   Obesity 11/29/2011    Past Surgical History:  Procedure Laterality Date   CARDIAC CATHETERIZATION  2005   CARDIOVERSION N/A 08/05/2015   Procedure: CARDIOVERSION;  Surgeon: Hugh Madura, MD;  Location: [facility] ENDOSCOPY;  Service: Cardiovascular;  Laterality: N/A;   CARDIOVERSION N/A 07/19/2017   Procedure: CARDIOVERSION;  Surgeon: Lake Pilgrim, MD;  Location: [facility]  ENDOSCOPY;  Service: Cardiovascular;  Laterality: N/A;   CARDIOVERSION N/A 03/25/2019   Procedure: CARDIOVERSION;  Surgeon: Hugh Madura, MD;  Location: [facility] ENDOSCOPY;  Service: Cardiovascular;  Laterality: N/A;   HERNIA REPAIR     TEE WITHOUT CARDIOVERSION N/A 08/05/2015   Procedure: TRANSESOPHAGEAL ECHOCARDIOGRAM (TEE);  Surgeon: Hugh Madura, MD;  Location: [facility] ENDOSCOPY;  Service: Cardiovascular;  Laterality: N/A;    Family History  Problem Relation Age of Onset   Diabetes Mother     Social History   Socioeconomic History   Marital status: Married    Spouse name: Not on file   Number of children: Not on file   Years of education: Not on file   Highest education level: Not on file  Occupational History   Not on file  Tobacco Use   Smoking status: Former    Passive exposure: Past   Smokeless tobacco: Never  Vaping Use   Vaping status: Never Used  Substance and Sexual Activity   Alcohol use: Yes    Alcohol/week: 4.0 standard drinks of alcohol    Types: 2 Glasses of wine, 2 Cans of beer per week    Comment: occ   Drug use: No   Sexual activity: Yes  Other Topics Concern   Not on file  Social History Narrative   Lives in [place]   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   Social Drivers of Corporate investment banker Strain: Low Risk  (08/31/2022)   Overall Financial Resource Strain (CARDIA)    Difficulty of Paying Living  Expenses: Not very hard  Food Insecurity: No Food Insecurity (08/31/2022)   Hunger Vital Sign    Worried About Running Out of Food in the Last Year: Never true    Ran Out of Food in the Last Year: Never true  Transportation Needs: No Transportation Needs (08/31/2022)   PRAPARE - Administrator, Civil Service (Medical): No    Lack of Transportation (Non-Medical): No  Physical Activity: Unknown (08/31/2022)   Exercise Vital Sign    Days of Exercise per Week: 0 days    Minutes of Exercise per Session: Not on file  Stress: No  Stress Concern Present (08/31/2022)   [employer] of Occupational Health - Occupational Stress Questionnaire    Feeling of Stress : Only a little  Social Connections: Socially Isolated (08/31/2022)   Social Connection and Isolation Panel [NHANES]    Frequency of Communication with Friends and Family: Twice a week    Frequency of Social Gatherings with Friends and Family: Never    Attends Religious Services: Never    Database administrator or Organizations: No    Attends Engineer, structural: Not on file    Marital Status: Married  Catering manager Violence: Not on file    Outpatient Medications Prior to Visit  Medication Sig Dispense Refill   acetaminophen  (TYLENOL ) 650 MG CR tablet Take 650 mg by mouth every 8 (eight) hours as needed for pain.     apixaban  (ELIQUIS ) 5 MG TABS tablet Take 1 tablet by mouth twice daily 180 tablet 1   Blood Glucose Monitoring Suppl (ONE TOUCH ULTRA MINI) w/Device KIT Use to check blood glucose 2x daily 1 kit 0   flecainide  (TAMBOCOR ) 50 MG tablet Take 1 tablet (50 mg total) by mouth 2 (two) times daily. 180 tablet 3   glipiZIDE  (GLUCOTROL ) 10 MG tablet Take 1 tablet (10 mg total) by mouth 2 (two) times daily before a meal. 180 tablet 1   lisinopril  (ZESTRIL ) 2.5 MG tablet Take 1 tablet (2.5 mg total) by mouth daily. 90 tablet 1   metoprolol  succinate (TOPROL -XL) 25 MG 24 hr tablet TAKE 1 TABLET BY MOUTH TWICE DAILY .  MAY  TAKE  AN  EXTRA  TABLET  AS  NEEDED  FOR  BREAKTHROUGH. 225 tablet 3   rosuvastatin  (CRESTOR ) 20 MG tablet Take 1 tablet by mouth once daily 90 tablet 0   sildenafil  (VIAGRA ) 100 MG tablet Take 1 tablet by mouth as needed.     JARDIANCE  25 MG TABS tablet Take 1 tablet (25 mg total) by mouth daily. 90 tablet 1   metFORMIN  (GLUCOPHAGE -XR) 500 MG 24 hr tablet Take 2 tablets (1,000 mg total) by mouth daily with breakfast. 180 tablet 1   sitaGLIPtin  (JANUVIA ) 100 MG tablet Take 1 tablet (100 mg total) by mouth daily. 90 tablet 1    meloxicam (MOBIC) 15 MG tablet Take 15 mg by mouth as needed for pain. (Patient not taking: Reported on 01/09/2023)     ONETOUCH VERIO test strip USE TO CHECK BLOOD GLUCOSE TWICE DAILY (Patient not taking: Reported on 03/29/2023) 100 each 0   OZEMPIC , 0.25 OR 0.5 MG/DOSE, 2 MG/3ML SOPN INJECT 0.5 MG INTO THE SKIN ONCE A WEEK 3 mL 0   No facility-administered medications prior to visit.    No Known Allergies  ROS Review of Systems  Constitutional:  Negative for appetite change, chills, fatigue and fever.  HENT:  Negative for congestion, postnasal drip, rhinorrhea and sneezing.   Respiratory:  Negative for cough, shortness of breath and wheezing.   Cardiovascular:  Negative for chest pain, palpitations and leg swelling.  Gastrointestinal:  Negative for abdominal pain, constipation, nausea and vomiting.  Genitourinary:  Negative for difficulty urinating, dysuria, flank pain and frequency.  Musculoskeletal:  Negative for arthralgias, back pain, joint swelling and myalgias.  Skin:  Negative for color change, pallor, rash and wound.  Neurological:  Negative for dizziness, facial asymmetry, weakness, numbness and headaches.  Psychiatric/Behavioral:  Negative for behavioral problems, confusion, self-injury and suicidal ideas.       Objective:     Physical Exam Vitals and nursing note reviewed.  Constitutional:      General: He is not in acute distress.    Appearance: Normal appearance. He is obese. He is not ill-appearing, toxic-appearing or diaphoretic.  Eyes:     General: No scleral icterus.       Right eye: No discharge.        Left eye: No discharge.     Extraocular Movements: Extraocular movements intact.     Conjunctiva/sclera: Conjunctivae normal.  Cardiovascular:     Rate and Rhythm: Normal rate and regular rhythm.     Pulses: Normal pulses.     Heart sounds: Normal heart sounds. No murmur heard.    No friction rub. No gallop.  Pulmonary:     Effort: Pulmonary effort is  normal. No respiratory distress.     Breath sounds: Normal breath sounds. No stridor. No wheezing, rhonchi or rales.  Chest:     Chest wall: No tenderness.  Abdominal:     General: There is no distension.     Palpations: Abdomen is soft.     Tenderness: There is no abdominal tenderness. There is no right CVA tenderness, left CVA tenderness or guarding.  Musculoskeletal:        General: No swelling, tenderness, deformity or signs of injury.     Right lower leg: No edema.     Left lower leg: No edema.  Skin:    General: Skin is warm and dry.     Capillary Refill: Capillary refill takes less than 2 seconds.     Coloration: Skin is not jaundiced or pale.     Findings: No bruising, erythema or lesion.  Neurological:     Mental Status: He is alert and oriented to person, place, and time.     Motor: No weakness.     Coordination: Coordination normal.     Gait: Gait normal.  Psychiatric:        Mood and Affect: Mood normal.        Behavior: Behavior normal.        Thought Content: Thought content normal.        Judgment: Judgment normal.     BP 122/67   Pulse 71   Temp 97.8 F (36.6 C)   Wt 280 lb (127 kg)   SpO2 100%   BMI 43.85 kg/m  Wt Readings from Last 3 Encounters:  06/07/23 280 lb (127 kg)  03/29/23 288 lb (130.6 kg)  01/09/23 291 lb (132 kg)    Lab Results  Component Value Date   TSH 2.369 08/04/2015   Lab Results  Component Value Date   WBC 6.9 08/30/2022   HGB 12.6 (L) 08/30/2022   HCT 38.7 (L) 08/30/2022   MCV 94.4 08/30/2022   PLT 151 08/30/2022   Lab Results  Component Value Date   NA 136 08/30/2022   K 4.6 08/30/2022  CO2 24 08/30/2022   GLUCOSE 175 (H) 08/30/2022   BUN 22 (H) 08/30/2022   CREATININE 0.75 08/30/2022   BILITOT 0.9 08/30/2022   ALKPHOS 70 08/30/2022   AST 29 08/30/2022   ALT 29 08/30/2022   PROT 7.2 08/30/2022   ALBUMIN 3.8 08/30/2022   CALCIUM  9.7 08/30/2022   ANIONGAP 14 08/30/2022   EGFR 107 05/10/2021   Lab Results   Component Value Date   CHOL 128 08/10/2021   Lab Results  Component Value Date   HDL 54 08/10/2021   Lab Results  Component Value Date   LDLCALC 54 08/10/2021   Lab Results  Component Value Date   TRIG 112 08/10/2021   Lab Results  Component Value Date   CHOLHDL 2.4 08/10/2021   Lab Results  Component Value Date   HGBA1C 10.8 (A) 03/29/2023      Assessment & Plan:   Problem List Items Addressed This Visit       Cardiovascular and Mediastinum   Diabetes mellitus with coincident hypertension (HCC) - Primary   Continue Januvia  100 mg daily, increase metformin  to 1000 mg twice daily, continue glipizide  10 mg twice daily, Jardiance  25 mg daily CBG goals discussed, importance of getting diabetes under control discussed We discussed referral for diabetic nutrition education but states that he does not have 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 The importance of attaining a healthy weight is also discussed.     06/07/2023    8:01 AM 03/29/2023    2:09 PM 01/09/2023    2:46 PM 12/12/2022    2:51 PM 10/04/2022   10:54 AM 09/06/2022   11:21 AM 08/30/2022   10:42 AM  BP/Weight  Systolic BP 122 108 121 106 100 106 101  Diastolic BP 67 58 67 69 70 60 69  Wt. (Lbs) 280 288 291 284 280 280   BMI 43.85 kg/m2 45.11 kg/m2 45.58 kg/m2 44.48 kg/m2 43.85 kg/m2 43.85 kg/m2            Relevant Orders   CMP14+EGFR     Nervous and  Auditory   Neuropathy   Start gabapentin 300 mg daily at bedtime Need to get diabetes under control discussed Checking B12 level      Relevant Medications   gabapentin (NEURONTIN) 300 MG capsule   Other Relevant Orders   Vitamin B12     Other   Morbid obesity (HCC)   Wt Readings from Last 3 Encounters:  06/07/23 280 lb (127 kg)  03/29/23 288 lb (130.6 kg)  01/09/23 291 lb (132 kg)   Body mass index is 43.85 kg/m.  Patient counseled on low-carb diet Encouraged to engage in regular moderate exercises at least 150 minutes weekly as tolerated.      Relevant Medications   JARDIANCE  25 MG TABS tablet   metFORMIN  (GLUCOPHAGE -XR) 500 MG 24 hr tablet   sitaGLIPtin  (JANUVIA ) 100 MG tablet   Dyslipidemia   Lab  Results  Component Value Date   CHOL 128 08/10/2021   HDL 54 08/10/2021   LDLCALC 54 08/10/2021   TRIG 112 08/10/2021   CHOLHDL 2.4 08/10/2021  Checking lipid panel continue rosuvastatin  20 mg daily      Relevant Orders   Lipid panel    Meds ordered this encounter  Medications   JARDIANCE  25 MG TABS tablet    Sig: Take 1 tablet (25 mg total) by mouth daily.    Dispense:  90 tablet    Refill:  1   metFORMIN  (GLUCOPHAGE -XR) 500 MG 24 hr tablet    Sig: Take 2 tablets (1,000 mg total) by mouth 2 (two) times daily with a meal.    Dispense:  180 tablet    Refill:  2   sitaGLIPtin  (JANUVIA ) 100 MG tablet    Sig: Take 1 tablet (100 mg total) by mouth daily.    Dispense:  90 tablet    Refill:  1   gabapentin (NEURONTIN) 300 MG capsule    Sig: Take 1 capsule (300 mg total) by mouth at bedtime.    Dispense:  90 capsule    Refill:  0    Follow-up: Return in about 4 weeks (around 07/05/2023) for DM.    Bianca Vester R Katey Barrie, FNP

## 2023-06-08 LAB — CMP14+EGFR
ALT: 26 IU/L (ref 0–44)
AST: 22 IU/L (ref 0–40)
Albumin: 4.4 g/dL (ref 3.8–4.9)
Alkaline Phosphatase: 108 IU/L (ref 44–121)
BUN/Creatinine Ratio: 39 — ABNORMAL HIGH (ref 9–20)
BUN: 24 mg/dL (ref 6–24)
Bilirubin Total: 0.4 mg/dL (ref 0.0–1.2)
CO2: 18 mmol/L — ABNORMAL LOW (ref 20–29)
Calcium: 9.9 mg/dL (ref 8.7–10.2)
Chloride: 101 mmol/L (ref 96–106)
Creatinine, Ser: 0.62 mg/dL — ABNORMAL LOW (ref 0.76–1.27)
Globulin, Total: 2.6 g/dL (ref 1.5–4.5)
Glucose: 172 mg/dL — ABNORMAL HIGH (ref 70–99)
Potassium: 4.8 mmol/L (ref 3.5–5.2)
Sodium: 140 mmol/L (ref 134–144)
Total Protein: 7 g/dL (ref 6.0–8.5)
eGFR: 112 mL/min/{1.73_m2} (ref >59)

## 2023-06-08 LAB — LIPID PANEL
Chol/HDL Ratio: 2.4 ratio (ref 0.0–5.0)
Cholesterol, Total: 139 mg/dL (ref 100–199)
HDL: 57 mg/dL (ref >39)
LDL Chol Calc (NIH): 63 mg/dL (ref 0–99)
Triglycerides: 102 mg/dL (ref 0–149)
VLDL Cholesterol Cal: 19 mg/dL (ref 5–40)

## 2023-06-08 LAB — VITAMIN B12: Vitamin B-12: 584 pg/mL (ref 232–1245)

## 2023-06-09 ENCOUNTER — Ambulatory Visit: Payer: Self-pay | Admitting: Nurse Practitioner

## 2023-06-13 ENCOUNTER — Other Ambulatory Visit: Payer: Self-pay | Admitting: Internal Medicine

## 2023-06-13 ENCOUNTER — Other Ambulatory Visit: Payer: Self-pay

## 2023-06-13 DIAGNOSIS — I480 Paroxysmal atrial fibrillation: Secondary | ICD-10-CM

## 2023-06-13 NOTE — Telephone Encounter (Signed)
 Pt last saw Dr Paulita Boss 07/01/22, last labs 06/07/23 Creat 0.62, age 58, weight 127kg, based on specified criteria pt is on appropriate dosage of Eliquis  5mg  BID for afib.  Will refill rx.

## 2023-06-13 NOTE — Progress Notes (Signed)
   06/13/2023  Patient ID: Juan Schwartz, male   DOB: 1965-08-18, 58 y.o.   MRN: [id number]  Attempted to contact patient for medication management/review. Left HIPAA compliant message for patient to return my call at their convenience. Patient recently saw PCP on 06/07/2023 who plans to check A1c at the next appointment.   SECOND attempt for patient outreach. Will follow up with patient to reschedule.   Thank you for allowing pharmacy to be a part of this patient's care.  Alexandria Angel, PharmD Clinical Pharmacist Cell: [phone]

## 2023-06-21 ENCOUNTER — Encounter: Payer: Self-pay | Admitting: Podiatry

## 2023-06-21 ENCOUNTER — Ambulatory Visit: Admitting: Podiatry

## 2023-06-21 DIAGNOSIS — L600 Ingrowing nail: Secondary | ICD-10-CM

## 2023-06-21 DIAGNOSIS — E119 Type 2 diabetes mellitus without complications: Secondary | ICD-10-CM | POA: Diagnosis not present

## 2023-06-21 DIAGNOSIS — I10 Essential (primary) hypertension: Secondary | ICD-10-CM | POA: Diagnosis not present

## 2023-06-21 NOTE — Progress Notes (Signed)
 Subjective:   Patient ID: Juan Schwartz, male   DOB: 58 y.o.   MRN: [id number]   HPI Patient presents stating that he has a at times painful left big toenail along the borders and it has been abnormally position for a long time and he does have diabetes not in great control.  Patient does not smoke tries to be active moderate obesity   Review of Systems  All other systems reviewed and are negative.       Objective:  Physical Exam Vitals and nursing note reviewed.  Constitutional:      Appearance: He is well-developed.  Pulmonary:     Effort: Pulmonary effort is normal.  Musculoskeletal:        General: Normal range of motion.  Skin:    General: Skin is warm.  Neurological:     Mental Status: He is alert.     Neurovascular status was found to be intact muscle strength was found to be adequate range of motion is adequate.  Patient is found to have a damaged left hallux nail that is lifted in the center and irritated in the size with no active drainage noted.  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  Answered all questions reappoint if's it gets better or if he starts to get infection in the nailbed

## 2023-06-27 ENCOUNTER — Telehealth: Payer: Self-pay | Admitting: *Deleted

## 2023-06-27 NOTE — Progress Notes (Signed)
 Complex Care Management Care Guide Note  06/27/2023 Name: Juan Schwartz MRN: [id number] DOB: 1965-06-08  Juan Schwartz is a 58 y.o. year old male who is a primary care patient of Paseda, Folashade R, FNP and is actively engaged with the care management team. I reached out to [facility] by phone today to assist with re-scheduling  with the Pharmacist.  Follow up plan: Unsuccessful telephone outreach attempt made.   Barnie Bora  [facility]  Value[facility]Based Care Institute, [facility] Guide  Direct Dial: [phone]  Fax [phone]

## 2023-06-27 NOTE — Progress Notes (Signed)
 Progress Notes  Amedeo Bailiff, [facility] at 06/13/2023 11:30 AM  Status: Signed     06/13/2023   Patient ID: Juan Schwartz, male   DOB: Jun 05, 1965, 58 y.o.   MRN: [id number]    Attempted to contact patient for medication management/review. Left HIPAA compliant message for patient to return my call at their convenience. Patient recently saw PCP on 06/07/2023 who plans to check A1c at the next appointment.    SECOND attempt for patient outreach. Will follow up with patient to reschedule.    Thank you for allowing pharmacy to be a part of this patient's care.   Alexandria Angel, PharmD Clinical Pharmacist Cell: [phone]

## 2023-07-03 ENCOUNTER — Telehealth: Payer: Self-pay | Admitting: *Deleted

## 2023-07-03 NOTE — Progress Notes (Signed)
 Complex Care Management Note  Care Guide Note 07/03/2023 Name: Juan Schwartz MRN: [id number] DOB: 01-Dec-1965  Juan Schwartz is a 58 y.o. year old male who sees Paseda, Folashade R, FNP for primary care. I reached out to [facility] by phone today reschedule call with Pharmacy.    Complex Care Management Consent Status: Patient did not agree to participate in calls or appointments with Pharmacy.  No further outreach attempts will be made at this time.   Encounter Outcome:  Patient Refused  Barnie Bora  [facility]  [facility], [facility] Guide  Direct Dial: [phone]  Fax [phone]   [facility]  Value-Based Care Institute, [facility] Guide  Direct Dial: [phone]  Fax [phone]

## 2023-07-03 NOTE — Progress Notes (Signed)
 Complex Care Management Care Guide Note  07/03/2023 Name: Juan Schwartz MRN: [id number] DOB: 1965/09/16  [name] is a 58 y.o. year old male who is a primary care patient of Paseda, Folashade R, FNP and is actively engaged with the care management team. I reached out to [facility] by phone today to assist with re-scheduling  with the Pharmacist.  Follow up plan: Unsuccessful telephone outreach attempt made. A HIPAA compliant phone message was left for the patient providing contact information and requesting a return call. No further outreach attempts will be made at this time. We have been unable to contact the patient to reschedule for complex care management services.   Barnie Bora  [facility]  Value-Based Care Institute, [facility] Guide  Direct Dial: [phone]  Fax [phone]

## 2023-07-14 ENCOUNTER — Telehealth: Payer: Self-pay | Admitting: Nurse Practitioner

## 2023-07-14 NOTE — Telephone Encounter (Signed)
 Patient was identified as falling into the True [place] Measure - Diabetes.   Patient was: Appointment already scheduled for:  07/18/23. Pt was referred to pharmacy, but declined outreach when contacted by pharmacy team.

## 2023-07-18 ENCOUNTER — Ambulatory Visit: Payer: Self-pay | Admitting: Nurse Practitioner

## 2023-08-01 ENCOUNTER — Other Ambulatory Visit: Payer: Self-pay | Admitting: Nurse Practitioner

## 2023-08-01 DIAGNOSIS — E119 Type 2 diabetes mellitus without complications: Secondary | ICD-10-CM

## 2023-08-07 ENCOUNTER — Telehealth: Payer: Self-pay | Admitting: Nurse Practitioner

## 2023-08-07 NOTE — Telephone Encounter (Signed)
 Patient was identified as falling into the True [place] Measure - Diabetes.   Patient was: Appointment already scheduled for:  08/29/23.

## 2023-08-23 ENCOUNTER — Ambulatory Visit: Attending: Internal Medicine | Admitting: Internal Medicine

## 2023-08-23 ENCOUNTER — Encounter: Payer: Self-pay | Admitting: Internal Medicine

## 2023-08-23 VITALS — BP 116/68 | HR 70 | Ht 67.0 in | Wt 277.0 lb

## 2023-08-23 DIAGNOSIS — E119 Type 2 diabetes mellitus without complications: Secondary | ICD-10-CM | POA: Diagnosis not present

## 2023-08-23 DIAGNOSIS — I480 Paroxysmal atrial fibrillation: Secondary | ICD-10-CM

## 2023-08-23 DIAGNOSIS — I10 Essential (primary) hypertension: Secondary | ICD-10-CM

## 2023-08-23 DIAGNOSIS — I5042 Chronic combined systolic (congestive) and diastolic (congestive) heart failure: Secondary | ICD-10-CM

## 2023-08-23 MED ORDER — METOPROLOL SUCCINATE ER 25 MG PO TB24: ORAL_TABLET | ORAL | 3 refills | Status: AC

## 2023-08-23 MED ORDER — FLECAINIDE ACETATE 50 MG PO TABS: 50.0000 mg | ORAL_TABLET | Freq: Two times a day (BID) | ORAL | 3 refills | Status: AC

## 2023-08-23 MED ORDER — APIXABAN 5 MG PO TABS: 5.0000 mg | ORAL_TABLET | Freq: Two times a day (BID) | ORAL | 1 refills | Status: AC

## 2023-08-23 NOTE — Patient Instructions (Signed)
 Medication Instructions:  Your physician recommends that you continue on your current medications as directed. Please refer to the Current Medication list given to you today.  *If you need a refill on your cardiac medications before your next appointment, please call your pharmacy*  Lab Work: NONE  If you have labs (blood work) drawn today and your tests are completely normal, you will receive your results only by: MyChart Message (if you have MyChart) OR A paper copy in the mail If you have any lab test that is abnormal or we need to change your treatment, we will call you to review the results.  Testing/Procedures: NONE  Follow-Up: At [facility], you and your health needs are our priority.  As part of our continuing mission to provide you with exceptional heart care, our providers are all part of one team.  This team includes your primary Cardiologist (physician) and Advanced Practice Providers or APPs (Physician Assistants and Nurse Practitioners) who all work together to provide you with the care you need, when you need it.  Your next appointment:   1 year(s)  Provider:   Orren Fabry, PA-C      Then, Stanly DELENA Leavens, MD will plan to see you again in 2 year(s).

## 2023-08-23 NOTE — Progress Notes (Signed)
 Cardiology Office Note:  .    Date:  08/23/2023  ID:  MATTHEWJAMES PETRASEK, DOB 28-Jan-1965, MRN [id number] PCP: Juanice Thomes SAUNDERS, FNP  [facility] Providers Cardiologist:  Stanly DELENA Leavens, MD     CC: AF eval   History of Present Illness: .    Estaban CHRISTELLA Buchta is a 58 y.o. male with paroxysmal atrial fibrillation who presents for cardiovascular follow-up.  He has a history of paroxysmal atrial fibrillation with previous cardioversions in 2007 and 2021. Recently, he experienced an episode of atrial fibrillation, which he attributes to overexertion and working in the heat. He managed the episode by doubling his medication and resting, and reported that his symptoms improved within eight hours. He is currently on metoprolol  succinate and Eliquis .  He has hypertension and hyperlipidemia, managed with Crestor  20 mg. His cholesterol levels worsened recently but have improved with weight loss and increased physical activity. He exercises five to seven times a week, including lifting weights and cardio, and logs his calories.  He has diabetes and was previously on Ozempic  and metformin , which caused diarrhea. He has since switched to Januvia , taken at night without adverse effects. His A1c was high in March but has improved since then.  He has a history of morbid obesity and has been actively working on [employer]. He has lost four pounds recently and continues to work out regularly despite a busy schedule with full-time work and a Interior and spatial designer business.  He has a history of alcohol use and is considering reducing alcohol intake due to its relationship with atrial fibrillation.  He has a mildly reduced ejection fraction and was told that his function had normalized in 2023.  He mentions having obstructive sleep apnea, for which he has been evaluated multiple times, but currently does not have enough episodes to warrant further intervention.  Discussed the use of AI scribe  software for clinical note transcription with the patient, who gave verbal consent to proceed.   Relevant histories: .  Social  - 2022: AF - 2023: Dr. Claudene- stable disease - 2024: LVEF improved  ROS: As per HPI.   Studies Reviewed: .     Cardiac Studies & Procedures   ______________________________________________________________________________________________   STRESS TESTS  EXERCISE TOLERANCE TEST (ETT) 06/25/2019  Interpretation Summary  Blood pressure demonstrated a normal response to exercise.  There was no ST segment deviation noted during stress.  No T wave inversion was noted during stress.  1. Non-diagnostic ECG treadmill stress test for ischemia (only achieved 75% MPHR). 2. No ischemic changes observed but cannot exclude at higher heart rate. 3. Average exercise capacity (4:29 min:s; 7 METS). 4. Normal HR/BP response to exercise.   ECHOCARDIOGRAM  ECHOCARDIOGRAM COMPLETE 10/06/2021  Narrative ECHOCARDIOGRAM REPORT    Patient Name:   STEADMAN PROSPERI Date of Exam: 10/06/2021 Medical Rec #:  [id number]           Height:       68.0 in Accession #:    [id number]          Weight:       287.0 lb Date of Birth:  1965/03/20           BSA:          2.383 m Patient Age:    55 years            BP:           128/78 mmHg Patient Gender: M  HR:           74 bpm. Exam Location:  [address]  Procedure: 2D Echo, Cardiac Doppler and Color Doppler  Indications:    I50.42 Chronic combined systolic (congestive) and diastolic (congestive) heart failure  History:        Patient has prior history of Echocardiogram examinations, most recent 11/13/2017. Arrythmias:Atrial Fibrillation; Risk Factors:Hypertension, Diabetes, Dyslipidemia and Sleep Apnea. Obesity. NICM.  Sonographer:    Jon Hacker RCS Referring Phys: [phone] [facility]  IMPRESSIONS   1. Left ventricular ejection fraction, by estimation, is 55 to 60%. The left ventricle has  normal function. The left ventricle has no regional wall motion abnormalities. Left ventricular diastolic parameters were normal. 2. Right ventricular systolic function is normal. The right ventricular size is normal. 3. The mitral valve is normal in structure. No evidence of mitral valve regurgitation. No evidence of mitral stenosis. 4. The aortic valve is tricuspid. Aortic valve regurgitation is not visualized. No aortic stenosis is present. 5. The inferior vena cava is normal in size with greater than 50% respiratory variability, suggesting right atrial pressure of 3 mmHg.  Comparison(s): No significant change from prior study. Prior images reviewed side by side. The left ventricular function has improved.  FINDINGS Left Ventricle: Left ventricular ejection fraction, by estimation, is 55 to 60%. The left ventricle has normal function. The left ventricle has no regional wall motion abnormalities. The left ventricular internal cavity size was normal in size. There is no left ventricular hypertrophy. Left ventricular diastolic parameters were normal. Normal left ventricular filling pressure.  Right Ventricle: The right ventricular size is normal. No increase in right ventricular wall thickness. Right ventricular systolic function is normal.  Left Atrium: Left atrial size was normal in size.  Right Atrium: Right atrial size was normal in size.  Pericardium: There is no evidence of pericardial effusion.  Mitral Valve: The mitral valve is normal in structure. No evidence of mitral valve regurgitation. No evidence of mitral valve stenosis.  Tricuspid Valve: The tricuspid valve is normal in structure. Tricuspid valve regurgitation is not demonstrated. No evidence of tricuspid stenosis.  Aortic Valve: The aortic valve is tricuspid. Aortic valve regurgitation is not visualized. No aortic stenosis is present.  Pulmonic Valve: The pulmonic valve was grossly normal. Pulmonic valve regurgitation is  not visualized. No evidence of pulmonic stenosis.  Aorta: The aortic root is normal in size and structure.  Venous: The inferior vena cava is normal in size with greater than 50% respiratory variability, suggesting right atrial pressure of 3 mmHg.  IAS/Shunts: No atrial level shunt detected by color flow Doppler.   LEFT VENTRICLE PLAX 2D LVIDd:         4.80 cm   Diastology LVIDs:         3.55 cm   LV e' medial:    10.90 cm/s LV PW:         1.00 cm   LV E/e' medial:  11.1 LV IVS:        0.90 cm   LV e' lateral:   9.81 cm/s LVOT diam:     2.30 cm   LV E/e' lateral: 12.3 LV SV:         100 LV SV Index:   42 LVOT Area:     4.15 cm   RIGHT VENTRICLE RV Basal diam:  2.90 cm RV S prime:     14.70 cm/s TAPSE (M-mode): 2.9 cm  LEFT ATRIUM  Index        RIGHT ATRIUM           Index LA diam:        3.60 cm 1.51 cm/m   RA Area:     15.30 cm LA Vol (A2C):   53.3 ml 22.37 ml/m  RA Volume:   40.90 ml  17.16 ml/m LA Vol (A4C):   38.7 ml 16.24 ml/m LA Biplane Vol: 49.3 ml 20.69 ml/m AORTIC VALVE LVOT Vmax:   121.00 cm/s LVOT Vmean:  79.700 cm/s LVOT VTI:    0.240 m  AORTA Ao Root diam: 3.10 cm Ao Asc diam:  3.00 cm  MITRAL VALVE MV Area (PHT): 4.63 cm     SHUNTS MV Decel Time: 164 msec     Systemic VTI:  0.24 m MV E velocity: 121.00 cm/s  Systemic Diam: 2.30 cm MV A velocity: 99.10 cm/s MV E/A ratio:  1.22  Mihai Croitoru MD Electronically signed by Jerel Balding MD Signature Date/Time: 10/06/2021/3:17:38 PM    Final   TEE  ECHO TEE 08/05/2015  Narrative *[place]* *[facility]* [address] [place], [place] [address] [phone]  ------------------------------------------------------------------- Transesophageal Echocardiography  Patient:    Kaiel, Weide MR #:       [id number] Study Date: 08/05/2015 Gender:     M Age:        49 Height:     175.3 cm Weight:     122.7 kg BSA:        2.5 m^2 Pt. Status: Room:        2H20C  ADMITTING    Victory MICAEL Sharps, MD ATTENDING    Victory MICAEL Sharps, MD TISA Maxie Herlene MARLA REFERRING    [place], Herlene K PERFORMING   Oneil Parchment, M.D. SONOGRAPHER  Augustin Seals  cc:  ------------------------------------------------------------------- LV EF: 40% -   45%  ------------------------------------------------------------------- Indications:      Atrial fibrillation - 427.31.  ------------------------------------------------------------------- History:   PMH:   Congestive heart failure.  Risk factors: Hypertension. Diabetes mellitus. Dyslipidemia.  ------------------------------------------------------------------- Study Conclusions  - Left ventricle: Systolic function was mildly to moderately reduced. The estimated ejection fraction was in the range of 40% to 45%. Diffuse hypokinesis. - Left atrium: No evidence of thrombus in the atrial cavity or appendage. No evidence of thrombus in the appendage. - Right atrium: No evidence of thrombus in the atrial cavity or appendage. - Superior vena cava: The study excluded a thrombus.  ------------------------------------------------------------------- Study data:   Study status:  Routine.  Consent:  The risks, benefits, and alternatives to the procedure were explained to the patient and informed consent was obtained.  Procedure:  The patient reported no pain pre or post test. Initial setup. The patient was brought to the laboratory. Surface ECG leads were monitored. Sedation. Conscious sedation was administered by anesthesiology staff. Transesophageal echocardiography. Topical anesthesia was obtained using viscous lidocaine . A transesophageal probe was inserted by the attending cardiologist. Image quality was adequate. Study completion:  The patient tolerated the procedure well. There were no complications.          Diagnostic transesophageal echocardiography.  2D and color Doppler.  Birthdate:   Patient birthdate: 05/21/1965.  Age:  Patient is 58 yr old.  Sex:  Gender: male.    BMI: 40 kg/m^2.  Blood pressure:     101/52  Patient status:  Inpatient.  Study date:  Study date: 08/05/2015. Study time: 11:09 AM.  Location:  Endoscopy.  -------------------------------------------------------------------  -------------------------------------------------------------------  Left ventricle:  Systolic function was mildly to moderately reduced. The estimated ejection fraction was in the range of 40% to 45%. Diffuse hypokinesis.  ------------------------------------------------------------------- Aortic valve:   Structurally normal valve.   Cusp separation was normal.  No evidence of vegetation.  Doppler:  There was no regurgitation.  ------------------------------------------------------------------- Aorta:  The aorta was normal, not dilated, and non-diseased.  ------------------------------------------------------------------- Mitral valve:   Structurally normal valve.   Leaflet separation was normal.  No evidence of vegetation.  Doppler:  There was no regurgitation.  ------------------------------------------------------------------- Left atrium:  The atrium was normal in size.  No evidence of thrombus in the atrial cavity or appendage.  No evidence of thrombus in the appendage. The appendage was of normal size. Emptying velocity was normal.  ------------------------------------------------------------------- Right ventricle:  The cavity size was normal. Wall thickness was normal. Systolic function was normal.  ------------------------------------------------------------------- Pulmonic valve:    Structurally normal valve.   Cusp separation was normal.  No evidence of vegetation.  ------------------------------------------------------------------- Tricuspid valve:   Structurally normal valve.   Leaflet separation was normal.  No evidence of vegetation.  Doppler:  There  was trivial regurgitation.  ------------------------------------------------------------------- Right atrium:  The atrium was normal in size.  No evidence of thrombus in the atrial cavity or appendage.  ------------------------------------------------------------------- Pericardium:  The pericardium was normal in appearance. There was no pericardial effusion.  ------------------------------------------------------------------- Systemic veins: Superior vena cava: The study excluded a thrombus.  ------------------------------------------------------------------- Post procedure conclusions Ascending Aorta:  - The aorta was normal, not dilated, and non-diseased.  ------------------------------------------------------------------- Prepared and Electronically Authenticated by  Oneil Parchment, M.D. 2017-07-12T12:17:22  MONITORS  CARDIAC EVENT MONITOR 11/08/2017  Narrative Sinus rhythm Episodes of atrial fibrillation and atrial flutter are noted,  V rates are frequently elevated       ______________________________________________________________________________________________       Physical Exam:    VS:  BP 116/68   Pulse 70   Ht 5' 7 (1.702 m)   Wt 277 lb (125.6 kg)   SpO2 98%   BMI 43.38 kg/m    Wt Readings from Last 3 Encounters:  08/23/23 277 lb (125.6 kg)  06/07/23 280 lb (127 kg)  03/29/23 288 lb (130.6 kg)    Gen: no distress, morbid obesity   Neck: No JVD Cardiac: No Rubs or Gallops, no Murmur, RRR +2 radial pulses Respiratory: Clear to auscultation bilaterally, normal effort, normal  respiratory rate GI: Soft, nontender, non-distended  MS: No  edema;  moves all extremities Integument: Skin feels warm Neuro:  At time of evaluation, alert and oriented to person/place/time/situation  Psych: Normal affect, patient feels ok   ASSESSMENT AND PLAN: .    An EKG was ordered for AF and shows SR  Paroxysmal atrial fibrillation with recovered ejection  fraction (tachy mediated with improved function in SR) Paroxysmal atrial fibrillation with infrequent episodes, triggered by heat and repetitive physical activity. Ejection fraction has recovered with current medications. - Maintain current medications: metoprolol  succinate, Eliquis  - Advise avoidance of heat and repetitive strenuous activity - No change to Doctor Smith's work note regarding activity limitations - discussed avoiding alcohol  Hypertension Hypertension is well-managed with current medications.  Hyperlipidemia Hyperlipidemia managed with Crestor  20 mg. Cholesterol levels have worsened slightly, but he is actively working on lifestyle modifications. - Continue Crestor  20 mg - Encourage continued lifestyle modifications including exercise and diet  Morbid Obesity Mild OSA- no tx required DM- A1c 10 - intolerance to GLP-1 therapy; PCNP is managing therapy  One year with my  team Two years with me   Stanly Leavens, MD FASE [facility] Cardiologist [facility]  [address] [place], [address] [place], [place] [address] [phone]  10:50 AM

## 2023-08-28 ENCOUNTER — Other Ambulatory Visit: Payer: Self-pay | Admitting: Nurse Practitioner

## 2023-08-28 DIAGNOSIS — E119 Type 2 diabetes mellitus without complications: Secondary | ICD-10-CM

## 2023-08-29 ENCOUNTER — Ambulatory Visit: Admitting: Nurse Practitioner

## 2023-08-29 ENCOUNTER — Encounter: Payer: Self-pay | Admitting: Nurse Practitioner

## 2023-08-29 VITALS — BP 124/59 | HR 65 | Temp 97.5°F | Wt 280.0 lb

## 2023-08-29 DIAGNOSIS — E785 Hyperlipidemia, unspecified: Secondary | ICD-10-CM | POA: Diagnosis not present

## 2023-08-29 DIAGNOSIS — E119 Type 2 diabetes mellitus without complications: Secondary | ICD-10-CM | POA: Diagnosis not present

## 2023-08-29 DIAGNOSIS — I480 Paroxysmal atrial fibrillation: Secondary | ICD-10-CM

## 2023-08-29 DIAGNOSIS — I10 Essential (primary) hypertension: Secondary | ICD-10-CM

## 2023-08-29 DIAGNOSIS — G629 Polyneuropathy, unspecified: Secondary | ICD-10-CM

## 2023-08-29 LAB — POCT GLYCOSYLATED HEMOGLOBIN (HGB A1C): Hemoglobin A1C: 9.8 % — AB (ref 4.0–5.6)

## 2023-08-29 MED ORDER — GLIPIZIDE 10 MG PO TABS: 10.0000 mg | ORAL_TABLET | Freq: Two times a day (BID) | ORAL | 1 refills | Status: DC

## 2023-08-29 MED ORDER — ROSUVASTATIN CALCIUM 20 MG PO TABS: 20.0000 mg | ORAL_TABLET | Freq: Every day | ORAL | 0 refills | Status: DC

## 2023-08-29 MED ORDER — LISINOPRIL 2.5 MG PO TABS: 2.5000 mg | ORAL_TABLET | Freq: Every day | ORAL | 1 refills | Status: DC

## 2023-08-29 MED ORDER — JARDIANCE 25 MG PO TABS: 25.0000 mg | ORAL_TABLET | Freq: Every day | ORAL | 1 refills | Status: DC

## 2023-08-29 MED ORDER — SITAGLIPTIN PHOSPHATE 100 MG PO TABS: 100.0000 mg | ORAL_TABLET | Freq: Every day | ORAL | 1 refills | Status: DC

## 2023-08-29 MED ORDER — METFORMIN HCL ER 500 MG PO TB24
500.0000 mg | ORAL_TABLET | Freq: Two times a day (BID) | ORAL | 2 refills | Status: DC
Start: 2023-08-29 — End: 2023-11-29

## 2023-08-29 NOTE — Progress Notes (Signed)
 Established Patient Office Visit  Subjective:  Patient ID: Juan Schwartz, male    DOB: 1965-10-28  Age: 58 y.o. MRN: [id number]  CC:  Chief Complaint  Patient presents with   Diabetes    HPI Juan Schwartz is a 58 y.o. male  has a past medical history of Allergy, Atrial fibrillation (HCC) (08/04/2015), Dehydration (08/04/2015), Diabetes mellitus, type 2 (HCC) (11/29/2011), Essential hypertension (08/04/2015), Hyperlipidemia, Hypertension, Mild sleep apnea (2017), NICM (nonischemic cardiomyopathy) (HCC), Normal coronary arteries (2004), and Obesity (11/29/2011).  Patient presents for follow-up for his chronic medical conditions  Hypertension.  Currently on metoprolol  25 mg daily, lisinopril  2.5 mg daily.  Patient denies chest pain shortness of breath edema  Type 2 diabetes.  Currently on glipizide  10 mg twice daily, Januvia  100 mg daily, Jardiance  25 mg daily, taking metformin  500 mg twice daily, states that he 1000 mg twice daily dose gives him diarrhea.  Denies polyuria polyphagia polydipsia  .   Past Medical History:  Diagnosis Date   Allergy    Atrial fibrillation (HCC) 08/04/2015   CHA2DS2-VASc = 3   Dehydration 08/04/2015   Diabetes mellitus, type 2 (HCC) 11/29/2011   Essential hypertension 08/04/2015   Hyperlipidemia    Hypertension    Mild sleep apnea 2017   NICM (nonischemic cardiomyopathy) (HCC)    Normal coronary arteries 2004   Obesity 11/29/2011    Past Surgical History:  Procedure Laterality Date   CARDIAC CATHETERIZATION  2005   CARDIOVERSION N/A 08/05/2015   Procedure: CARDIOVERSION;  Surgeon: Oneil JAYSON Parchment, MD;  Location: [facility] ENDOSCOPY;  Service: Cardiovascular;  Laterality: N/A;   CARDIOVERSION N/A 07/19/2017   Procedure: CARDIOVERSION;  Surgeon: Alveta Aleene PARAS, MD;  Location: [facility] ENDOSCOPY;  Service: Cardiovascular;  Laterality: N/A;   CARDIOVERSION N/A 03/25/2019   Procedure: CARDIOVERSION;  Surgeon: Parchment Oneil JAYSON, MD;  Location: [facility] ENDOSCOPY;   Service: Cardiovascular;  Laterality: N/A;   HERNIA REPAIR     TEE WITHOUT CARDIOVERSION N/A 08/05/2015   Procedure: TRANSESOPHAGEAL ECHOCARDIOGRAM (TEE);  Surgeon: Oneil JAYSON Parchment, MD;  Location: [facility] ENDOSCOPY;  Service: Cardiovascular;  Laterality: N/A;    Family History  Problem Relation Age of Onset   Diabetes Mother     Social History   Socioeconomic History   Marital status: Married    Spouse name: Not on file   Number of children: Not on file   Years of education: Not on file   Highest education level: Not on file  Occupational History   Not on file  Tobacco Use   Smoking status: Former    Passive exposure: Past   Smokeless tobacco: Never  Vaping Use   Vaping status: Never Used  Substance and Sexual Activity   Alcohol use: Yes    Alcohol/week: 4.0 standard drinks of alcohol    Types: 2 Glasses of wine, 2 Cans of beer per week    Comment: occ   Drug use: No   Sexual activity: Yes  Other Topics Concern   Not on file  Social History Narrative   Lives in [place]   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   Social Drivers of Corporate investment banker Strain: Low Risk  (08/28/2023)   Overall Financial Resource Strain (CARDIA)    Difficulty of Paying Living Expenses: Not hard at all  Food Insecurity: No Food Insecurity (08/28/2023)   Hunger Vital Sign    Worried About Running Out of Food in the Last Year: Never true  Ran Out of Food in the Last Year: Never true  Transportation Needs: No Transportation Needs (08/28/2023)   PRAPARE - Administrator, Civil Service (Medical): No    Lack of Transportation (Non-Medical): No  Physical Activity: Insufficiently Active (08/28/2023)   Exercise Vital Sign    Days of Exercise per Week: 3 days    Minutes of Exercise per Session: 30 min  Stress: No Stress Concern Present (08/28/2023)   [employer] of Occupational Health - Occupational Stress Questionnaire    Feeling of Stress: Not at all  Social  Connections: Socially Isolated (08/28/2023)   Social Connection and Isolation Panel    Frequency of Communication with Friends and Family: Never    Frequency of Social Gatherings with Friends and Family: Never    Attends Religious Services: Never    Database administrator or Organizations: No    Attends Engineer, structural: Not on file    Marital Status: Married  Catering manager Violence: Not on file    Outpatient Medications Prior to Visit  Medication Sig Dispense Refill   acetaminophen  (TYLENOL ) 650 MG CR tablet Take 650 mg by mouth every 8 (eight) hours as needed for pain.     apixaban  (ELIQUIS ) 5 MG TABS tablet Take 1 tablet (5 mg total) by mouth 2 (two) times daily. 180 tablet 1   flecainide  (TAMBOCOR ) 50 MG tablet Take 1 tablet (50 mg total) by mouth 2 (two) times daily. 180 tablet 3   metoprolol  succinate (TOPROL -XL) 25 MG 24 hr tablet TAKE 1 TABLET BY MOUTH TWICE DAILY .  MAY  TAKE  AN  EXTRA  TABLET  AS  NEEDED  FOR  BREAKTHROUGH. 225 tablet 3   sildenafil  (VIAGRA ) 100 MG tablet Take 1 tablet by mouth as needed.     glipiZIDE  (GLUCOTROL ) 10 MG tablet Take 1 tablet (10 mg total) by mouth 2 (two) times daily before a meal. (Patient taking differently: Take 1 tablet (10 mg total) by mouth 2 (two) times daily before a meal.) 180 tablet 1   JARDIANCE  25 MG TABS tablet Take 1 tablet (25 mg total) by mouth daily. 90 tablet 1   lisinopril  (ZESTRIL ) 2.5 MG tablet Take 1 tablet (2.5 mg total) by mouth daily. 90 tablet 1   metFORMIN  (GLUCOPHAGE -XR) 500 MG 24 hr tablet Take 2 tablets (1,000 mg total) by mouth 2 (two) times daily with a meal. (Patient taking differently: Take 2 tablets (1,000 mg total) by mouth 2 (two) times daily with a meal.) 180 tablet 2   rosuvastatin  (CRESTOR ) 20 MG tablet Take 1 tablet by mouth once daily 90 tablet 0   sitaGLIPtin  (JANUVIA ) 100 MG tablet Take 1 tablet (100 mg total) by mouth daily. 90 tablet 1   Blood Glucose Monitoring Suppl (ONE TOUCH ULTRA MINI)  w/Device KIT Use to check blood glucose 2x daily (Patient not taking: Reported on 08/29/2023) 1 kit 0   meloxicam (MOBIC) 15 MG tablet Take 15 mg by mouth as needed for pain.     gabapentin  (NEURONTIN ) 300 MG capsule Take 1 capsule (300 mg total) by mouth at bedtime. 90 capsule 0   glipiZIDE  (GLUCOTROL ) 5 MG tablet TAKE 1 TABLET BY MOUTH TWICE DAILY BEFORE A MEAL (Patient not taking: Reported on 08/29/2023) 60 tablet 0   No facility-administered medications prior to visit.    No Known Allergies  ROS Review of Systems  Constitutional:  Negative for appetite change, chills, fatigue and fever.  HENT:  Negative for  congestion, postnasal drip, rhinorrhea and sneezing.   Respiratory:  Negative for cough, shortness of breath and wheezing.   Cardiovascular:  Negative for chest pain, palpitations and leg swelling.  Gastrointestinal:  Negative for abdominal pain, constipation, nausea and vomiting.  Genitourinary:  Negative for difficulty urinating, dysuria, flank pain and frequency.  Musculoskeletal:  Negative for arthralgias, back pain, joint swelling and myalgias.  Skin:  Negative for color change, pallor, rash and wound.  Neurological:  Negative for dizziness, facial asymmetry, weakness, numbness and headaches.  Psychiatric/Behavioral:  Negative for behavioral problems, confusion, self-injury and suicidal ideas.       Objective:    Physical Exam Vitals and nursing note reviewed.  Constitutional:      General: He is not in acute distress.    Appearance: Normal appearance. He is obese. He is not ill-appearing, toxic-appearing or diaphoretic.  Eyes:     General: No scleral icterus.       Right eye: No discharge.        Left eye: No discharge.     Extraocular Movements: Extraocular movements intact.  Cardiovascular:     Rate and Rhythm: Normal rate and regular rhythm.     Pulses: Normal pulses.     Heart sounds: Normal heart sounds. No murmur heard.    No friction rub. No gallop.   Pulmonary:     Effort: Pulmonary effort is normal. No respiratory distress.     Breath sounds: Normal breath sounds. No stridor. No wheezing, rhonchi or rales.  Chest:     Chest wall: No tenderness.  Abdominal:     General: There is no distension.     Palpations: Abdomen is soft.     Tenderness: There is no abdominal tenderness. There is no right CVA tenderness, left CVA tenderness or guarding.  Musculoskeletal:        General: No swelling, tenderness, deformity or signs of injury.     Right lower leg: No edema.     Left lower leg: No edema.  Skin:    General: Skin is warm and dry.     Capillary Refill: Capillary refill takes less than 2 seconds.     Coloration: Skin is not jaundiced or pale.     Findings: No bruising, erythema or lesion.  Neurological:     Mental Status: He is alert and oriented to person, place, and time.     Motor: No weakness.     Coordination: Coordination normal.     Gait: Gait normal.  Psychiatric:        Mood and Affect: Mood normal.        Behavior: Behavior normal.        Thought Content: Thought content normal.        Judgment: Judgment normal.     BP (!) 124/59   Pulse 65   Temp (!) 97.5 F (36.4 C)   Wt 280 lb (127 kg)   SpO2 100%   BMI 43.85 kg/m  Wt Readings from Last 3 Encounters:  08/29/23 280 lb (127 kg)  08/23/23 277 lb (125.6 kg)  06/07/23 280 lb (127 kg)    Lab Results  Component Value Date   TSH 2.369 08/04/2015   Lab Results  Component Value Date   WBC 6.9 08/30/2022   HGB 12.6 (L) 08/30/2022   HCT 38.7 (L) 08/30/2022   MCV 94.4 08/30/2022   PLT 151 08/30/2022   Lab Results  Component Value Date   NA 140 06/07/2023   K  4.8 06/07/2023   CO2 18 (L) 06/07/2023   GLUCOSE 172 (H) 06/07/2023   BUN 24 06/07/2023   CREATININE 0.62 (L) 06/07/2023   BILITOT 0.4 06/07/2023   ALKPHOS 108 06/07/2023   AST 22 06/07/2023   ALT 26 06/07/2023   PROT 7.0 06/07/2023   ALBUMIN 4.4 06/07/2023   CALCIUM  9.9 06/07/2023    ANIONGAP 14 08/30/2022   EGFR 112 06/07/2023   Lab Results  Component Value Date   CHOL 139 06/07/2023   Lab Results  Component Value Date   HDL 57 06/07/2023   Lab Results  Component Value Date   LDLCALC 63 06/07/2023   Lab Results  Component Value Date   TRIG 102 06/07/2023   Lab Results  Component Value Date   CHOLHDL 2.4 06/07/2023   Lab Results  Component Value Date   HGBA1C 9.8 (A) 08/29/2023      Assessment & Plan:   Problem List Items Addressed This Visit       Cardiovascular and Mediastinum   Diabetes mellitus with coincident hypertension (HCC) - Primary   Lab Results  Component Value Date   HGBA1C 9.8 (A) 08/29/2023  Chronic uncontrolled condition Patient is not willing to start insulin ,, not willing to take actos, declined referral to endocrinologist and the clinical pharmacist, declined referral for diabetes nutrition education Stating that he has been avoiding sugar fluids soda, does not monitor blood sugar Patient works 2 jobs so has not been exercising but he plans to start going to the gym,lose weight and eat better cutting down on high carbohydrate foods. need to get his diabetes under control discussed Materials for diabetic nutrition education provided Follow-up in 3 months        Relevant Medications   glipiZIDE  (GLUCOTROL ) 10 MG tablet   JARDIANCE  25 MG TABS tablet   metFORMIN  (GLUCOPHAGE -XR) 500 MG 24 hr tablet   sitaGLIPtin  (JANUVIA ) 100 MG tablet   lisinopril  (ZESTRIL ) 2.5 MG tablet   rosuvastatin  (CRESTOR ) 20 MG tablet   Other Relevant Orders   POCT glycosylated hemoglobin (Hb A1C) (Completed)   Microalbumin / creatinine urine ratio   Paroxysmal atrial fibrillation (HCC)   Continue flecainide  50 mg twice daily, Eliquis  5 mg twice daily, metoprolol  25 mg daily Maintain close follow-up with cardiology      Relevant Medications   lisinopril  (ZESTRIL ) 2.5 MG tablet   rosuvastatin  (CRESTOR ) 20 MG tablet   Essential hypertension    Currently well-controlled Continue lisinopril  2.5 mg daily, metoprolol  25 mg daily DASH diet and commitment to daily physical activity for a minimum of 30 minutes discussed and encouraged, as a part of hypertension management. The importance of attaining a healthy weight is also discussed.     08/29/2023    8:40 AM 08/23/2023    9:17 AM 06/07/2023    8:01 AM 03/29/2023    2:09 PM 01/09/2023    2:46 PM 12/12/2022    2:51 PM 10/04/2022   10:54 AM  BP/Weight  Systolic BP  116 877 108 121 106 100  Diastolic BP  68 67 58 67 69 70  Wt. (Lbs) 280 277 280 288 291 284 280  BMI 43.85 kg/m2 43.38 kg/m2 43.85 kg/m2 45.11 kg/m2 45.58 kg/m2 44.48 kg/m2 43.85 kg/m2            Relevant Medications   lisinopril  (ZESTRIL ) 2.5 MG tablet   rosuvastatin  (CRESTOR ) 20 MG tablet     Nervous and Auditory   Neuropathy   Taking OTC nerve medications that helps.  He has stopped taking gabapentin         Other   Morbid obesity (HCC)   Wt Readings from Last 3 Encounters:  08/29/23 280 lb (127 kg)  08/23/23 277 lb (125.6 kg)  06/07/23 280 lb (127 kg)   Body mass index is 43.85 kg/m.  Patient counseled on low-carb high-protein diet Encouraged to engage in regular moderate to vigorous exercises at least 150 minutes weekly as tolerated Benefits of healthy weights discussed      Relevant Medications   glipiZIDE  (GLUCOTROL ) 10 MG tablet   JARDIANCE  25 MG TABS tablet   metFORMIN  (GLUCOPHAGE -XR) 500 MG 24 hr tablet   sitaGLIPtin  (JANUVIA ) 100 MG tablet   Dyslipidemia   Continue rosuvastatin  20 mg daily Avoid fatty fried foods Lab Results  Component Value Date   CHOL 139 06/07/2023   HDL 57 06/07/2023   LDLCALC 63 06/07/2023   TRIG 102 06/07/2023   CHOLHDL 2.4 06/07/2023         Relevant Medications   rosuvastatin  (CRESTOR ) 20 MG tablet    Meds ordered this encounter  Medications   glipiZIDE  (GLUCOTROL ) 10 MG tablet    Sig: Take 1 tablet (10 mg total) by mouth 2 (two) times daily before a  meal.    Dispense:  180 tablet    Refill:  1   JARDIANCE  25 MG TABS tablet    Sig: Take 1 tablet (25 mg total) by mouth daily.    Dispense:  90 tablet    Refill:  1   metFORMIN  (GLUCOPHAGE -XR) 500 MG 24 hr tablet    Sig: Take 1 tablet (500 mg total) by mouth 2 (two) times daily with a meal.    Dispense:  180 tablet    Refill:  2   sitaGLIPtin  (JANUVIA ) 100 MG tablet    Sig: Take 1 tablet (100 mg total) by mouth daily.    Dispense:  90 tablet    Refill:  1   lisinopril  (ZESTRIL ) 2.5 MG tablet    Sig: Take 1 tablet (2.5 mg total) by mouth daily.    Dispense:  90 tablet    Refill:  1   rosuvastatin  (CRESTOR ) 20 MG tablet    Sig: Take 1 tablet (20 mg total) by mouth daily.    Dispense:  90 tablet    Refill:  0    Follow-up: Return in about 3 months (around 11/29/2023) for DM, HTN, HYPERLIPIDEMIA.    Eryn Marandola R Atiyah Bauer, FNP

## 2023-08-29 NOTE — Assessment & Plan Note (Addendum)
 Taking OTC nerve medications that helps.  He has stopped taking gabapentin 

## 2023-08-29 NOTE — Assessment & Plan Note (Addendum)
 Lab Results  Component Value Date   HGBA1C 9.8 (A) 08/29/2023  Chronic uncontrolled condition Patient is not willing to start insulin ,, not willing to take actos, declined referral to endocrinologist and the clinical pharmacist, declined referral for diabetes nutrition education Stating that he has been avoiding sugar fluids soda, does not monitor blood sugar Patient works 2 jobs so has not been exercising but he plans to start going to the gym,lose weight and eat better cutting down on high carbohydrate foods. need to get his diabetes under control discussed Materials for diabetic nutrition education provided Follow-up in 3 months

## 2023-08-29 NOTE — Assessment & Plan Note (Signed)
 Currently well-controlled Continue lisinopril  2.5 mg daily, metoprolol  25 mg daily DASH diet and commitment to daily physical activity for a minimum of 30 minutes discussed and encouraged, as a part of hypertension management. The importance of attaining a healthy weight is also discussed.     08/29/2023    8:40 AM 08/23/2023    9:17 AM 06/07/2023    8:01 AM 03/29/2023    2:09 PM 01/09/2023    2:46 PM 12/12/2022    2:51 PM 10/04/2022   10:54 AM  BP/Weight  Systolic BP  116 877 108 121 106 100  Diastolic BP  68 67 58 67 69 70  Wt. (Lbs) 280 277 280 288 291 284 280  BMI 43.85 kg/m2 43.38 kg/m2 43.85 kg/m2 45.11 kg/m2 45.58 kg/m2 44.48 kg/m2 43.85 kg/m2

## 2023-08-29 NOTE — Assessment & Plan Note (Signed)
 Continue flecainide  50 mg twice daily, Eliquis  5 mg twice daily, metoprolol  25 mg daily Maintain close follow-up with cardiology

## 2023-08-29 NOTE — Assessment & Plan Note (Signed)
 Continue rosuvastatin  20 mg daily Avoid fatty fried foods Lab Results  Component Value Date   CHOL 139 06/07/2023   HDL 57 06/07/2023   LDLCALC 63 06/07/2023   TRIG 102 06/07/2023   CHOLHDL 2.4 06/07/2023

## 2023-08-29 NOTE — Patient Instructions (Signed)
 Goal for fasting blood sugar ranges from 80 to 120 and 2 hours after any meal or at bedtime should be between 130 to 170.   1. Diabetes mellitus with coincident hypertension (HCC) (Primary)  - POCT glycosylated hemoglobin (Hb A1C) - Microalbumin / creatinine urine ratio - glipiZIDE  (GLUCOTROL ) 10 MG tablet; Take 1 tablet (10 mg total) by mouth 2 (two) times daily before a meal.  Dispense: 180 tablet; Refill: 1 - JARDIANCE  25 MG TABS tablet; Take 1 tablet (25 mg total) by mouth daily.  Dispense: 90 tablet; Refill: 1 - metFORMIN  (GLUCOPHAGE -XR) 500 MG 24 hr tablet; Take 1 tablet (500 mg total) by mouth 2 (two) times daily with a meal.  Dispense: 180 tablet; Refill: 2 - sitaGLIPtin  (JANUVIA ) 100 MG tablet; Take 1 tablet (100 mg total) by mouth daily.  Dispense: 90 tablet; Refill: 1  2. Dyslipidemia  - rosuvastatin  (CRESTOR ) 20 MG tablet; Take 1 tablet (20 mg total) by mouth daily.  Dispense: 90 tablet; Refill: 0  3. Essential hypertension  - lisinopril  (ZESTRIL ) 2.5 MG tablet; Take 1 tablet (2.5 mg total) by mouth daily.  Dispense: 90 tablet; Refill: 1    It is important that you exercise regularly at least 30 minutes 5 times a week as tolerated  Think about what you will eat, plan ahead. Choose  clean, green, fresh or frozen over canned, processed or packaged foods which are more sugary, salty and fatty. 70 to 75% of food eaten should be vegetables and fruit. Three meals at set times with snacks allowed between meals, but they must be fruit or vegetables. Aim to eat over a 12 hour period , example 7 am to 7 pm, and STOP after  your last meal of the day. Drink water,generally about 64 ounces per day, no other drink is as healthy. Fruit juice is best enjoyed in a healthy way, by EATING the fruit.  Thanks for choosing [facility] we consider it a privelige to serve you.

## 2023-08-29 NOTE — Assessment & Plan Note (Addendum)
 Wt Readings from Last 3 Encounters:  08/29/23 280 lb (127 kg)  08/23/23 277 lb (125.6 kg)  06/07/23 280 lb (127 kg)   Body mass index is 43.85 kg/m.  Patient counseled on low-carb high-protein diet Encouraged to engage in regular moderate to vigorous exercises at least 150 minutes weekly as tolerated Benefits of healthy weights discussed

## 2023-08-30 ENCOUNTER — Ambulatory Visit: Admitting: Nurse Practitioner

## 2023-08-30 LAB — MICROALBUMIN / CREATININE URINE RATIO
Creatinine, Urine: 31.7 mg/dL
Microalb/Creat Ratio: 59 mg/g{creat} — ABNORMAL HIGH (ref 0–29)
Microalbumin, Urine: 18.8 ug/mL

## 2023-09-01 ENCOUNTER — Ambulatory Visit: Payer: Self-pay | Admitting: Nurse Practitioner

## 2023-11-22 ENCOUNTER — Other Ambulatory Visit: Payer: Self-pay | Admitting: Nurse Practitioner

## 2023-11-22 DIAGNOSIS — E785 Hyperlipidemia, unspecified: Secondary | ICD-10-CM

## 2023-11-29 ENCOUNTER — Encounter: Payer: Self-pay | Admitting: Nurse Practitioner

## 2023-11-29 ENCOUNTER — Ambulatory Visit: Payer: Self-pay | Admitting: Nurse Practitioner

## 2023-11-29 VITALS — BP 108/58 | HR 72 | Wt 278.0 lb

## 2023-11-29 DIAGNOSIS — Z1211 Encounter for screening for malignant neoplasm of colon: Secondary | ICD-10-CM

## 2023-11-29 DIAGNOSIS — Z125 Encounter for screening for malignant neoplasm of prostate: Secondary | ICD-10-CM

## 2023-11-29 DIAGNOSIS — E1165 Type 2 diabetes mellitus with hyperglycemia: Secondary | ICD-10-CM

## 2023-11-29 DIAGNOSIS — E785 Hyperlipidemia, unspecified: Secondary | ICD-10-CM

## 2023-11-29 DIAGNOSIS — Z0000 Encounter for general adult medical examination without abnormal findings: Secondary | ICD-10-CM | POA: Insufficient documentation

## 2023-11-29 DIAGNOSIS — I10 Essential (primary) hypertension: Secondary | ICD-10-CM | POA: Diagnosis not present

## 2023-11-29 LAB — POCT GLYCOSYLATED HEMOGLOBIN (HGB A1C): Hemoglobin A1C: 9.1 % — AB (ref 4.0–5.6)

## 2023-11-29 MED ORDER — GLIPIZIDE 10 MG PO TABS: 10.0000 mg | ORAL_TABLET | Freq: Two times a day (BID) | ORAL | 2 refills | Status: AC

## 2023-11-29 MED ORDER — LISINOPRIL 2.5 MG PO TABS: 2.5000 mg | ORAL_TABLET | Freq: Every day | ORAL | 1 refills | Status: AC

## 2023-11-29 MED ORDER — JARDIANCE 25 MG PO TABS: 25.0000 mg | ORAL_TABLET | Freq: Every day | ORAL | 1 refills | Status: AC

## 2023-11-29 MED ORDER — SITAGLIPTIN PHOSPHATE 100 MG PO TABS: 100.0000 mg | ORAL_TABLET | Freq: Every day | ORAL | 1 refills | Status: AC

## 2023-11-29 MED ORDER — METFORMIN HCL ER 500 MG PO TB24
500.0000 mg | ORAL_TABLET | Freq: Two times a day (BID) | ORAL | 2 refills | Status: AC
Start: 2023-11-29 — End: ?

## 2023-11-29 NOTE — Assessment & Plan Note (Addendum)
 Wt Readings from Last 3 Encounters:  11/29/23 278 lb (126.1 kg)  08/29/23 280 lb (127 kg)  08/23/23 277 lb (125.6 kg)  Counseled on low-carb diet Encouraged regular moderate to vigorous exercise at least 150 minutes weekly as tolerated Benefits of healthy weights discussed

## 2023-11-29 NOTE — Patient Instructions (Addendum)
 [facility] [phone]  [address]   Goal for fasting blood sugar ranges from 80 to 120 and 2 hours after any meal or at bedtime should be between 130 to 170.   1. Annual physical exam (Primary)   2. Diabetes mellitus with coincident hypertension (HCC)  - POCT glycosylated hemoglobin (Hb A1C) - CMP14+EGFR - CBC - Direct LDL - Ambulatory referral to Endocrinology  3. Morbid obesity (HCC)   4. Screening for colon cancer  - Cologuard     It is important that you exercise regularly at least 30 minutes 5 times a week as tolerated  Think about what you will eat, plan ahead. Choose  clean, green, fresh or frozen over canned, processed or packaged foods which are more sugary, salty and fatty. 70 to 75% of food eaten should be vegetables and fruit. Three meals at set times with snacks allowed between meals, but they must be fruit or vegetables. Aim to eat over a 12 hour period , example 7 am to 7 pm, and STOP after  your last meal of the day. Drink water,generally about 64 ounces per day, no other drink is as healthy. Fruit juice is best enjoyed in a healthy way, by EATING the fruit.  Thanks for choosing [facility] we consider it a privelige to serve you.

## 2023-11-29 NOTE — Assessment & Plan Note (Signed)
 Annual exam as documented.  Counseling done include healthy lifestyle involving committing to 150 minutes of exercise per week, heart healthy diet, and attaining healthy weight. The importance of adequate sleep also discussed.  Regular use of seat belt and home safety were also discussed . Changes in health habits are decided on by patient with goals and time frames set for achieving them. Immunization and cancer screening  needs are specifically addressed at this visit.    Encouraged to consider getting vaccines due Cologuard ordered to screen for colon cancer

## 2023-11-29 NOTE — Assessment & Plan Note (Signed)
 Controlled on Crestor  20 mg daily Not fasting today checking direct LDL Lab Results  Component Value Date   CHOL 139 06/07/2023   HDL 57 06/07/2023   LDLCALC 63 06/07/2023   TRIG 102 06/07/2023   CHOLHDL 2.4 06/07/2023

## 2023-11-29 NOTE — Progress Notes (Signed)
 Complete physical exam  Patient: Juan Schwartz   DOB: August 26, 1965   58 y.o. Male  MRN: [id number]  Subjective:    Chief Complaint  Patient presents with   Annual Exam    Not fasting       Discussed the use of AI scribe software for clinical note transcription with the patient, who gave verbal consent to proceed.  History of Present Illness Juan Schwartz is a 58 year old male  has a past medical history of Allergy, Atrial fibrillation (HCC) (08/04/2015), Dehydration (08/04/2015), Diabetes mellitus, type 2 (HCC) (11/29/2011), Essential hypertension (08/04/2015), Hyperlipidemia, Hypertension, Mild sleep apnea (2017), NICM (nonischemic cardiomyopathy) (HCC), Normal coronary arteries (2004), and Obesity (11/29/2011).  who presents for an annual physical exam.   He has a history of diabetes with an A1c of 9.1% today, improved from 9.8% three months ago, though still above the target of less than 7%. He previously tried Ozempic  but did not tolerate the medication and is not interested in starting insulin .His current medications include , glipizide  10 mg twice daily, Jardiance  25 mg daily, metformin  500 mg twice daily(did not tolerate higher dose of metformin ), Januvia  100 mg daily, and rosuvastatin  20 mg daily. He has declined to see an endocrinologist.  His diet mainly consists of baked or broiled foods, with minimal fried foods. He occasionally consumes zero sugar drinks and avoids regular sugary drinks and orange juice. He has reduced his intake of rice and bread, opting for fiber-rich alternatives, although he finds them unpalatable. He attributes a potential rise in his sugar levels to consuming red wine recently.  He works as a orthoptist and finds it challenging to incorporate gym time into his schedule due to working two jobs. He plans to reduce his workload to focus more on his health. He has lost two pounds since his last visit, now weighing 278 pounds. He  occasionally drinks alcohol but has cut back significantly and does not smoke or use drugs.  No fever, chills, chest pain, shortness of breath, abdominal pain, nausea, vomiting, excessive urination, or excessive thirst. He has a history of high blood sugar levels for over a decade. He has refused several vaccines, including shingles, Tdap, flu, and pneumonia vaccines.  He mentions a sore calf muscle on both legs, which he attributes to muscle strain. He has seen a podiatrist for a toenail issue but was advised to wait for better blood sugar control before any procedure. He has been soaking his feet in Epsom salt and accidentally cut himself while trimming his toenail.    Assessment & Plan    Most recent fall risk assessment:    10/04/2022   10:46 AM  Fall Risk   Falls in the past year? 0  Number falls in past yr: 0  Injury with Fall? 0     Most recent depression screenings:    11/29/2023    9:50 AM 08/29/2023    8:47 AM  PHQ 2/9 Scores  PHQ - 2 Score 0 0  PHQ- 9 Score 0 0        Patient Care Team: Nerida Boivin R, FNP as PCP - General (Nurse Practitioner) Santo Stanly LABOR, MD as PCP - Cardiology (Cardiology)   Outpatient Medications Prior to Visit  Medication Sig   acetaminophen  (TYLENOL ) 650 MG CR tablet Take 650 mg by mouth every 8 (eight) hours as needed for pain.   apixaban  (ELIQUIS ) 5 MG TABS tablet Take 1 tablet (5 mg total) by mouth 2 (  two) times daily.   flecainide  (TAMBOCOR ) 50 MG tablet Take 1 tablet (50 mg total) by mouth 2 (two) times daily.   metoprolol  succinate (TOPROL -XL) 25 MG 24 hr tablet TAKE 1 TABLET BY MOUTH TWICE DAILY .  MAY  TAKE  AN  EXTRA  TABLET  AS  NEEDED  FOR  BREAKTHROUGH.   rosuvastatin  (CRESTOR ) 20 MG tablet Take 1 tablet by mouth once daily   sildenafil  (VIAGRA ) 100 MG tablet Take 1 tablet by mouth as needed.   [DISCONTINUED] glipiZIDE  (GLUCOTROL ) 10 MG tablet Take 1 tablet (10 mg total) by mouth 2 (two) times daily before a meal.    [DISCONTINUED] JARDIANCE  25 MG TABS tablet Take 1 tablet (25 mg total) by mouth daily.   [DISCONTINUED] lisinopril  (ZESTRIL ) 2.5 MG tablet Take 1 tablet (2.5 mg total) by mouth daily.   [DISCONTINUED] metFORMIN  (GLUCOPHAGE -XR) 500 MG 24 hr tablet Take 1 tablet (500 mg total) by mouth 2 (two) times daily with a meal.   [DISCONTINUED] sitaGLIPtin  (JANUVIA ) 100 MG tablet Take 1 tablet (100 mg total) by mouth daily.   Blood Glucose Monitoring Suppl (ONE TOUCH ULTRA MINI) w/Device KIT Use to check blood glucose 2x daily (Patient not taking: Reported on 11/29/2023)   [DISCONTINUED] meloxicam (MOBIC) 15 MG tablet Take 15 mg by mouth as needed for pain.   No facility-administered medications prior to visit.    Review of Systems  Constitutional:  Negative for appetite change, chills, fatigue and fever.  HENT:  Negative for congestion, postnasal drip, rhinorrhea and sneezing.   Eyes:  Negative for pain, discharge and itching.  Respiratory:  Negative for cough, shortness of breath and wheezing.   Cardiovascular:  Negative for chest pain, palpitations and leg swelling.  Gastrointestinal:  Negative for abdominal pain, constipation, nausea and vomiting.  Endocrine: Negative for cold intolerance, heat intolerance and polydipsia.  Genitourinary:  Negative for difficulty urinating, dysuria, flank pain and frequency.  Musculoskeletal:  Negative for arthralgias, back pain, joint swelling and myalgias.  Skin:  Negative for color change, pallor, rash and wound.  Neurological:  Negative for dizziness, facial asymmetry, weakness, numbness and headaches.  Psychiatric/Behavioral:  Negative for behavioral problems, confusion, self-injury and suicidal ideas.        Objective:     BP (!) 108/58   Pulse 72   Wt 278 lb (126.1 kg)   SpO2 100%   BMI 43.54 kg/m    Physical Exam Vitals and nursing note reviewed.  Constitutional:      General: He is not in acute distress.    Appearance: Normal appearance. He  is obese. He is not ill-appearing, toxic-appearing or diaphoretic.  HENT:     Right Ear: Tympanic membrane, ear canal and external ear normal. There is no impacted cerumen.     Left Ear: Tympanic membrane, ear canal and external ear normal. There is no impacted cerumen.     Nose: Nose normal. No congestion or rhinorrhea.     Mouth/Throat:     Mouth: Mucous membranes are moist.     Pharynx: Oropharynx is clear. No oropharyngeal exudate or posterior oropharyngeal erythema.  Eyes:     General: No scleral icterus.       Right eye: No discharge.        Left eye: No discharge.     Extraocular Movements: Extraocular movements intact.     Conjunctiva/sclera: Conjunctivae normal.  Neck:     Vascular: No carotid bruit.  Cardiovascular:     Rate and Rhythm: Normal  rate and regular rhythm.     Pulses: Normal pulses.     Heart sounds: Normal heart sounds. No murmur heard.    No friction rub. No gallop.  Pulmonary:     Effort: Pulmonary effort is normal. No respiratory distress.     Breath sounds: Normal breath sounds. No stridor. No wheezing, rhonchi or rales.  Chest:     Chest wall: No tenderness.  Abdominal:     General: Bowel sounds are normal. There is no distension.     Palpations: Abdomen is soft. There is no mass.     Tenderness: There is no abdominal tenderness. There is no right CVA tenderness, left CVA tenderness, guarding or rebound.     Hernia: No hernia is present.  Musculoskeletal:        General: No swelling, tenderness, deformity or signs of injury.     Cervical back: Normal range of motion and neck supple. No rigidity or tenderness.     Right lower leg: No edema.     Left lower leg: No edema.  Lymphadenopathy:     Cervical: No cervical adenopathy.  Skin:    General: Skin is warm and dry.     Capillary Refill: Capillary refill takes 2 to 3 seconds.     Coloration: Skin is not jaundiced.     Findings: No bruising or lesion.  Neurological:     Mental Status: He is alert  and oriented to person, place, and time.     Cranial Nerves: No cranial nerve deficit.     Sensory: No sensory deficit.     Motor: No weakness.     Coordination: Coordination normal.     Gait: Gait normal.     Deep Tendon Reflexes: Reflexes normal.  Psychiatric:        Mood and Affect: Mood normal.        Behavior: Behavior normal.        Thought Content: Thought content normal.        Judgment: Judgment normal.     Results for orders placed or performed in visit on 11/29/23  POCT glycosylated hemoglobin (Hb A1C)  Result Value Ref Range   Hemoglobin A1C 9.1 (A) 4.0 - 5.6 %   HbA1c POC (<> result, manual entry)     HbA1c, POC (prediabetic range)     HbA1c, POC (controlled diabetic range)         Assessment & Plan:    Routine Health Maintenance and Physical Exam   There is no immunization history on file for this patient.  Health Maintenance  Topic Date Due   COVID-19 Vaccine (1) Never done   Hepatitis B Vaccines 19-59 Average Risk (1 of 3 - 19+ 3-dose series) Never done   Fecal DNA (Cologuard)  Never done   Zoster Vaccines- Shingrix (1 of 2) 11/29/2023 (Originally 11/26/1985)   DTaP/Tdap/Td (1 - Tdap) 03/28/2024 (Originally 11/26/1985)   Influenza Vaccine  04/23/2024 (Originally 08/25/2023)   Pneumococcal Vaccine: 50+ Years (1 of 2 - PCV) 08/28/2024 (Originally 11/26/1985)   Hepatitis C Screening  11/28/2024 (Originally 11/26/1984)   HIV Screening  11/28/2024 (Originally 11/26/1981)   OPHTHALMOLOGY EXAM  02/20/2024   HEMOGLOBIN A1C  05/28/2024   Diabetic kidney evaluation - eGFR measurement  06/06/2024   Diabetic kidney evaluation - Urine ACR  08/28/2024   FOOT EXAM  11/28/2024   HPV VACCINES  Aged Out   Meningococcal B Vaccine  Aged Out    Discussed health benefits of physical activity, and  encouraged him to engage in regular exercise appropriate for his age and condition.  Problem List Items Addressed This Visit       Cardiovascular and Mediastinum   Essential  hypertension   DASH diet and commitment to daily physical activity for a minimum of 30 minutes discussed and encouraged, as a part of hypertension management. The importance of attaining a healthy weight is also discussed. Continue lisinopril  2.5 mg daily, metoprolol  25 mg daily     11/29/2023    9:41 AM 08/29/2023    8:40 AM 08/23/2023    9:17 AM 06/07/2023    8:01 AM 03/29/2023    2:09 PM 01/09/2023    2:46 PM 12/12/2022    2:51 PM  BP/Weight  Systolic BP 108 124 116 122 108 121 106  Diastolic BP 58 59 68 67 58 67 69  Wt. (Lbs) 278 280 277 280 288 291 284  BMI 43.54 kg/m2 43.85 kg/m2 43.38 kg/m2 43.85 kg/m2 45.11 kg/m2 45.58 kg/m2 44.48 kg/m2           Relevant Medications   lisinopril  (ZESTRIL ) 2.5 MG tablet     Endocrine   Uncontrolled type 2 diabetes mellitus with hyperglycemia, without long-term current use of insulin  (HCC)   Type 2 diabetes mellitus with poor glycemic control A1c improved to 9.1% but remains above target. On multiple medications. Declined insulin  and endocrinologist referral. Discussed complications of uncontrolled diabetes  Emphasized dietary changes and weight loss. - Encouraged dietary modifications to reduce sugar and carbohydrate intake. - Encouraged weight loss  - Referred to endocrinologist for expert opinion on diabetes management. Diabetic foot exam completed, importance of self diabetic foot exam discussed ContinueHis glipizide  10 mg twice daily, Jardiance  25 mg daily, metformin  500 mg twice daily, Januvia  100 mg daily,        Relevant Medications   glipiZIDE  (GLUCOTROL ) 10 MG tablet   JARDIANCE  25 MG TABS tablet   lisinopril  (ZESTRIL ) 2.5 MG tablet   metFORMIN  (GLUCOPHAGE -XR) 500 MG 24 hr tablet   sitaGLIPtin  (JANUVIA ) 100 MG tablet   Other Relevant Orders   POCT glycosylated hemoglobin (Hb A1C) (Completed)   CMP14+EGFR   CBC   Direct LDL   Ambulatory referral to Endocrinology     Other   Morbid obesity (HCC)   Wt Readings from Last 3  Encounters:  11/29/23 278 lb (126.1 kg)  08/29/23 280 lb (127 kg)  08/23/23 277 lb (125.6 kg)  Counseled on low-carb diet Encouraged regular moderate to vigorous exercise at least 150 minutes weekly as tolerated Benefits of healthy weights discussed      Relevant Medications   glipiZIDE  (GLUCOTROL ) 10 MG tablet   JARDIANCE  25 MG TABS tablet   metFORMIN  (GLUCOPHAGE -XR) 500 MG 24 hr tablet   sitaGLIPtin  (JANUVIA ) 100 MG tablet   Dyslipidemia   Controlled on Crestor  20 mg daily Not fasting today checking direct LDL Lab Results  Component Value Date   CHOL 139 06/07/2023   HDL 57 06/07/2023   LDLCALC 63 06/07/2023   TRIG 102 06/07/2023   CHOLHDL 2.4 06/07/2023         Screening for colon cancer   Relevant Orders   Cologuard   Annual physical exam - Primary   Annual exam as documented.  Counseling done include healthy lifestyle involving committing to 150 minutes of exercise per week, heart healthy diet, and attaining healthy weight. The importance of adequate sleep also discussed.  Regular use of seat belt and home safety were also discussed . Changes  in health habits are decided on by patient with goals and time frames set for achieving them. Immunization and cancer screening  needs are specifically addressed at this visit.    Encouraged to consider getting vaccines due Cologuard ordered to screen for colon cancer      Other Visit Diagnoses       Screening for prostate cancer       Relevant Orders   PSA      Return in about 3 months (around 02/29/2024) for DM.     Lasya Vetter R Bayli Quesinberry, FNP

## 2023-11-29 NOTE — Assessment & Plan Note (Signed)
 DASH diet and commitment to daily physical activity for a minimum of 30 minutes discussed and encouraged, as a part of hypertension management. The importance of attaining a healthy weight is also discussed. Continue lisinopril  2.5 mg daily, metoprolol  25 mg daily     11/29/2023    9:41 AM 08/29/2023    8:40 AM 08/23/2023    9:17 AM 06/07/2023    8:01 AM 03/29/2023    2:09 PM 01/09/2023    2:46 PM 12/12/2022    2:51 PM  BP/Weight  Systolic BP 108 124 116 122 108 121 106  Diastolic BP 58 59 68 67 58 67 69  Wt. (Lbs) 278 280 277 280 288 291 284  BMI 43.54 kg/m2 43.85 kg/m2 43.38 kg/m2 43.85 kg/m2 45.11 kg/m2 45.58 kg/m2 44.48 kg/m2

## 2023-11-29 NOTE — Assessment & Plan Note (Addendum)
 Type 2 diabetes mellitus with poor glycemic control A1c improved to 9.1% but remains above target. On multiple medications. Declined insulin  and endocrinologist referral. Discussed complications of uncontrolled diabetes  Emphasized dietary changes and weight loss. - Encouraged dietary modifications to reduce sugar and carbohydrate intake. - Encouraged weight loss  - Referred to endocrinologist for expert opinion on diabetes management. Diabetic foot exam completed, importance of self diabetic foot exam discussed ContinueHis glipizide  10 mg twice daily, Jardiance  25 mg daily, metformin  500 mg twice daily, Januvia  100 mg daily,

## 2023-11-30 LAB — CBC
Hematocrit: 41.9 % (ref 37.5–51.0)
Hemoglobin: 13.7 g/dL (ref 13.0–17.7)
MCH: 30.4 pg (ref 26.6–33.0)
MCHC: 32.7 g/dL (ref 31.5–35.7)
MCV: 93 fL (ref 79–97)
Platelets: 149 x10E3/uL — ABNORMAL LOW (ref 150–450)
RBC: 4.5 x10E6/uL (ref 4.14–5.80)
RDW: 13.9 % (ref 11.6–15.4)
WBC: 7.5 x10E3/uL (ref 3.4–10.8)

## 2023-11-30 LAB — CMP14+EGFR
ALT: 39 IU/L (ref 0–44)
AST: 45 IU/L — ABNORMAL HIGH (ref 0–40)
Albumin: 4.5 g/dL (ref 3.8–4.9)
Alkaline Phosphatase: 95 IU/L (ref 47–123)
BUN/Creatinine Ratio: 20 (ref 9–20)
BUN: 17 mg/dL (ref 6–24)
Bilirubin Total: 0.4 mg/dL (ref 0.0–1.2)
CO2: 24 mmol/L (ref 20–29)
Calcium: 9.8 mg/dL (ref 8.7–10.2)
Chloride: 102 mmol/L (ref 96–106)
Creatinine, Ser: 0.85 mg/dL (ref 0.76–1.27)
Globulin, Total: 2.8 g/dL (ref 1.5–4.5)
Glucose: 135 mg/dL — ABNORMAL HIGH (ref 70–99)
Potassium: 4.7 mmol/L (ref 3.5–5.2)
Sodium: 140 mmol/L (ref 134–144)
Total Protein: 7.3 g/dL (ref 6.0–8.5)
eGFR: 101 mL/min/1.73 (ref >59)

## 2023-11-30 LAB — LDL CHOLESTEROL, DIRECT: LDL Direct: 45 mg/dL (ref 0–99)

## 2023-12-01 ENCOUNTER — Ambulatory Visit: Payer: Self-pay | Admitting: Nurse Practitioner

## 2023-12-01 LAB — PSA: Prostate Specific Ag, Serum: 0.3 ng/mL (ref 0.0–4.0)

## 2023-12-01 LAB — SPECIMEN STATUS REPORT

## 2024-02-14 ENCOUNTER — Other Ambulatory Visit: Payer: Self-pay | Admitting: Nurse Practitioner

## 2024-02-14 DIAGNOSIS — E785 Hyperlipidemia, unspecified: Secondary | ICD-10-CM

## 2024-03-12 ENCOUNTER — Ambulatory Visit: Payer: Self-pay | Admitting: Nurse Practitioner

## 2024-05-29 ENCOUNTER — Ambulatory Visit: Admitting: Endocrinology
